# Patient Record
Sex: Female | Born: 1980 | Race: Black or African American | Hispanic: No | Marital: Married | Smoking: Current every day smoker
Health system: Southern US, Community
[De-identification: ages and names within clinical notes are randomized; demographics above are authoritative.]

## PROBLEM LIST (undated history)

## (undated) DIAGNOSIS — F419 Anxiety disorder, unspecified: Secondary | ICD-10-CM

## (undated) DIAGNOSIS — O139 Gestational [pregnancy-induced] hypertension without significant proteinuria, unspecified trimester: Secondary | ICD-10-CM

## (undated) DIAGNOSIS — G35 Multiple sclerosis: Secondary | ICD-10-CM

## (undated) DIAGNOSIS — H18609 Keratoconus, unspecified, unspecified eye: Secondary | ICD-10-CM

## (undated) DIAGNOSIS — F32A Depression, unspecified: Secondary | ICD-10-CM

## (undated) DIAGNOSIS — F329 Major depressive disorder, single episode, unspecified: Secondary | ICD-10-CM

## (undated) HISTORY — PX: NO PAST SURGERIES: SHX2092

## (undated) HISTORY — PX: WISDOM TOOTH EXTRACTION: SHX21

## (undated) HISTORY — DX: Multiple sclerosis: G35

## (undated) HISTORY — DX: Major depressive disorder, single episode, unspecified: F32.9

## (undated) HISTORY — DX: Depression, unspecified: F32.A

---
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
## 2007-01-31 ENCOUNTER — Ambulatory Visit (HOSPITAL_COMMUNITY): Admission: RE | Admit: 2007-01-31 | Discharge: 2007-01-31 | Payer: Self-pay | Admitting: Obstetrics & Gynecology

## 2007-02-02 ENCOUNTER — Emergency Department (HOSPITAL_COMMUNITY): Admission: EM | Admit: 2007-02-02 | Discharge: 2007-02-02 | Payer: Self-pay | Admitting: Emergency Medicine

## 2007-02-04 ENCOUNTER — Emergency Department (HOSPITAL_COMMUNITY): Admission: EM | Admit: 2007-02-04 | Discharge: 2007-02-04 | Payer: Self-pay | Admitting: Family Medicine

## 2007-02-05 ENCOUNTER — Encounter: Admission: RE | Admit: 2007-02-05 | Discharge: 2007-02-05 | Payer: Self-pay | Admitting: Family Medicine

## 2007-02-06 ENCOUNTER — Emergency Department (HOSPITAL_COMMUNITY): Admission: EM | Admit: 2007-02-06 | Discharge: 2007-02-06 | Payer: Self-pay | Admitting: Family Medicine

## 2007-02-14 ENCOUNTER — Ambulatory Visit (HOSPITAL_COMMUNITY): Admission: RE | Admit: 2007-02-14 | Discharge: 2007-02-14 | Payer: Self-pay | Admitting: Family Medicine

## 2007-06-30 ENCOUNTER — Inpatient Hospital Stay (HOSPITAL_COMMUNITY): Admission: AD | Admit: 2007-06-30 | Discharge: 2007-06-30 | Payer: Self-pay | Admitting: Obstetrics and Gynecology

## 2007-06-30 ENCOUNTER — Inpatient Hospital Stay (HOSPITAL_COMMUNITY): Admission: AD | Admit: 2007-06-30 | Discharge: 2007-07-02 | Payer: Self-pay | Admitting: Family Medicine

## 2007-06-30 ENCOUNTER — Ambulatory Visit: Payer: Self-pay | Admitting: Obstetrics and Gynecology

## 2007-12-14 ENCOUNTER — Emergency Department (HOSPITAL_COMMUNITY): Admission: EM | Admit: 2007-12-14 | Discharge: 2007-12-14 | Payer: Self-pay | Admitting: Family Medicine

## 2007-12-18 ENCOUNTER — Emergency Department (HOSPITAL_COMMUNITY): Admission: EM | Admit: 2007-12-18 | Discharge: 2007-12-18 | Payer: Self-pay | Admitting: Emergency Medicine

## 2007-12-20 ENCOUNTER — Emergency Department (HOSPITAL_COMMUNITY): Admission: EM | Admit: 2007-12-20 | Discharge: 2007-12-20 | Payer: Self-pay | Admitting: Emergency Medicine

## 2008-03-17 ENCOUNTER — Emergency Department (HOSPITAL_COMMUNITY): Admission: EM | Admit: 2008-03-17 | Discharge: 2008-03-17 | Payer: Self-pay | Admitting: Emergency Medicine

## 2008-12-04 ENCOUNTER — Emergency Department (HOSPITAL_COMMUNITY): Admission: EM | Admit: 2008-12-04 | Discharge: 2008-12-05 | Payer: Self-pay | Admitting: Emergency Medicine

## 2009-01-18 ENCOUNTER — Ambulatory Visit (HOSPITAL_COMMUNITY): Admission: RE | Admit: 2009-01-18 | Discharge: 2009-01-18 | Payer: Self-pay | Admitting: Family Medicine

## 2009-01-26 ENCOUNTER — Inpatient Hospital Stay (HOSPITAL_COMMUNITY): Admission: AD | Admit: 2009-01-26 | Discharge: 2009-01-26 | Payer: Self-pay | Admitting: Obstetrics and Gynecology

## 2009-02-08 ENCOUNTER — Ambulatory Visit (HOSPITAL_COMMUNITY): Admission: RE | Admit: 2009-02-08 | Discharge: 2009-02-08 | Payer: Self-pay | Admitting: Family Medicine

## 2009-02-25 ENCOUNTER — Ambulatory Visit (HOSPITAL_COMMUNITY): Admission: RE | Admit: 2009-02-25 | Discharge: 2009-02-25 | Payer: Self-pay | Admitting: Family Medicine

## 2009-07-07 ENCOUNTER — Inpatient Hospital Stay (HOSPITAL_COMMUNITY): Admission: AD | Admit: 2009-07-07 | Discharge: 2009-07-07 | Payer: Self-pay | Admitting: Obstetrics & Gynecology

## 2009-07-17 ENCOUNTER — Inpatient Hospital Stay (HOSPITAL_COMMUNITY): Admission: AD | Admit: 2009-07-17 | Discharge: 2009-07-17 | Payer: Self-pay | Admitting: Obstetrics & Gynecology

## 2009-07-22 ENCOUNTER — Inpatient Hospital Stay (HOSPITAL_COMMUNITY): Admission: AD | Admit: 2009-07-22 | Discharge: 2009-07-24 | Payer: Self-pay | Admitting: Obstetrics & Gynecology

## 2009-07-22 ENCOUNTER — Ambulatory Visit: Payer: Self-pay | Admitting: Advanced Practice Midwife

## 2009-12-20 ENCOUNTER — Emergency Department (HOSPITAL_COMMUNITY): Admission: EM | Admit: 2009-12-20 | Discharge: 2009-12-20 | Payer: Self-pay | Admitting: Family Medicine

## 2010-01-22 NOTE — L&D Delivery Note (Addendum)
Delivery Note At 4:42 AM a viable female was delivered via Vaginal, Spontaneous Delivery (Presentation: vtx;  Direct Occiput Posterior).  APGAR: 8/9; weight 7lbs 6.5Oz.   Placenta status: Intact, Spontaneous.  Cord: 3 vessels with the following complications: none.    Anesthesia: Epidural  Episiotomy: none Lacerations: minor abrasions Suture Repair: n/a Est. Blood Loss (mL): 200cc  Mom to postpartum.  Baby to nursery-stable.  Jasminemarie Sherrard Y 01/21/2011, 4:55 AM

## 2010-04-09 LAB — WET PREP, GENITAL
Clue Cells Wet Prep HPF POC: NONE SEEN
Yeast Wet Prep HPF POC: NONE SEEN

## 2010-04-09 LAB — CBC
Hemoglobin: 10 g/dL — ABNORMAL LOW (ref 12.0–15.0)
Hemoglobin: 11.1 g/dL — ABNORMAL LOW (ref 12.0–15.0)
MCHC: 33.8 g/dL (ref 30.0–36.0)
MCV: 96.7 fL (ref 78.0–100.0)
MCV: 97.2 fL (ref 78.0–100.0)
Platelets: 174 10*3/uL (ref 150–400)
Platelets: 189 10*3/uL (ref 150–400)
RBC: 3 MIL/uL — ABNORMAL LOW (ref 3.87–5.11)
WBC: 7.9 10*3/uL (ref 4.0–10.5)
WBC: 9.4 10*3/uL (ref 4.0–10.5)

## 2010-04-09 LAB — URINALYSIS, ROUTINE W REFLEX MICROSCOPIC
Nitrite: NEGATIVE
pH: 6 (ref 5.0–8.0)

## 2010-04-09 LAB — COMPREHENSIVE METABOLIC PANEL
AST: 29 U/L (ref 0–37)
CO2: 23 mEq/L (ref 19–32)
Calcium: 8.9 mg/dL (ref 8.4–10.5)
GFR calc Af Amer: >60 mL/min (ref >60)
Glucose, Bld: 86 mg/dL (ref 70–99)
Total Bilirubin: 0.3 mg/dL (ref 0.3–1.2)
Total Protein: 5.8 g/dL — ABNORMAL LOW (ref 6.0–8.3)

## 2010-04-09 LAB — URINALYSIS, DIPSTICK ONLY
Bilirubin Urine: NEGATIVE
Ketones, ur: NEGATIVE mg/dL
Urobilinogen, UA: 0.2 mg/dL (ref 0.0–1.0)

## 2010-04-09 LAB — URINE MICROSCOPIC-ADD ON

## 2010-04-09 LAB — RPR: RPR Ser Ql: NONREACTIVE

## 2010-04-09 LAB — URIC ACID: Uric Acid, Serum: 4.8 mg/dL (ref 2.4–7.0)

## 2010-04-09 LAB — GC/CHLAMYDIA PROBE AMP, GENITAL: Chlamydia, DNA Probe: NEGATIVE

## 2010-04-26 LAB — URINALYSIS, ROUTINE W REFLEX MICROSCOPIC
Bilirubin Urine: NEGATIVE
Hgb urine dipstick: NEGATIVE
Ketones, ur: 15 mg/dL — AB
Protein, ur: NEGATIVE mg/dL
Specific Gravity, Urine: 1.025 (ref 1.005–1.030)
pH: 6 (ref 5.0–8.0)

## 2010-04-26 LAB — HCG, QUANTITATIVE, PREGNANCY: hCG, Beta Chain, Quant, S: 11017 m[IU]/mL — ABNORMAL HIGH (ref <5)

## 2010-05-29 ENCOUNTER — Inpatient Hospital Stay (INDEPENDENT_AMBULATORY_CARE_PROVIDER_SITE_OTHER)
Admission: RE | Admit: 2010-05-29 | Discharge: 2010-05-29 | Disposition: A | Discharge status: Home / Self Care | Payer: Medicaid Other | Source: Ambulatory Visit | Attending: Family Medicine | Admitting: Family Medicine

## 2010-05-29 DIAGNOSIS — Z331 Pregnant state, incidental: Secondary | ICD-10-CM

## 2010-05-29 LAB — POCT PREGNANCY, URINE: Preg Test, Ur: POSITIVE

## 2010-07-25 ENCOUNTER — Ambulatory Visit (HOSPITAL_COMMUNITY): Payer: Medicaid Other

## 2010-08-02 ENCOUNTER — Encounter (HOSPITAL_COMMUNITY): Payer: Medicaid Other

## 2010-08-02 ENCOUNTER — Ambulatory Visit (HOSPITAL_COMMUNITY): Payer: Medicaid Other

## 2010-08-09 ENCOUNTER — Ambulatory Visit (HOSPITAL_COMMUNITY): Payer: Medicaid Other

## 2010-08-24 ENCOUNTER — Ambulatory Visit (HOSPITAL_COMMUNITY): Payer: Medicaid Other | Attending: Obstetrics and Gynecology

## 2010-10-12 LAB — CULTURE, ROUTINE-ABSCESS

## 2010-10-19 LAB — CBC
HCT: 36.8
Hemoglobin: 13.1
MCHC: 35.6
MCV: 94.7
RBC: 3.89
RDW: 12.5

## 2010-10-24 LAB — CULTURE, ROUTINE-ABSCESS

## 2010-12-22 ENCOUNTER — Encounter (HOSPITAL_COMMUNITY): Payer: Self-pay | Admitting: *Deleted

## 2010-12-22 ENCOUNTER — Inpatient Hospital Stay (HOSPITAL_COMMUNITY)
Admission: AD | Admit: 2010-12-22 | Discharge: 2010-12-22 | Disposition: A | Discharge status: Home / Self Care | Payer: Medicaid Other | Source: Ambulatory Visit | Attending: Obstetrics and Gynecology | Admitting: Obstetrics and Gynecology

## 2010-12-22 DIAGNOSIS — B3731 Acute candidiasis of vulva and vagina: Secondary | ICD-10-CM | POA: Insufficient documentation

## 2010-12-22 DIAGNOSIS — B373 Candidiasis of vulva and vagina: Secondary | ICD-10-CM

## 2010-12-22 DIAGNOSIS — O2390 Unspecified genitourinary tract infection in pregnancy, unspecified trimester: Secondary | ICD-10-CM | POA: Insufficient documentation

## 2010-12-22 HISTORY — DX: Gestational (pregnancy-induced) hypertension without significant proteinuria, unspecified trimester: O13.9

## 2010-12-22 LAB — URINALYSIS, ROUTINE W REFLEX MICROSCOPIC
Bilirubin Urine: NEGATIVE
Hgb urine dipstick: NEGATIVE
Nitrite: NEGATIVE
Protein, ur: NEGATIVE mg/dL
Urobilinogen, UA: 0.2 mg/dL (ref 0.0–1.0)

## 2010-12-22 LAB — WET PREP, GENITAL

## 2010-12-22 NOTE — ED Provider Notes (Signed)
History   30 yo G3P2002 at 27 5/7 weeks presented c/o vaginal wetness and some pelvic pressure.  Denies bleeding or contractions, reports +FM.  Diagnosed with yeast vaginitis over last 1-2 weeks, but did not use Terazol suppositories as prescribed.  Pregnancy remarkable for: Hx pre-eclampsia with last pregnancy Anxiety/depression--on Welbutrin Conceived on psychiatric meds (Klonipin and Celexa) Daughter with pigmentary mosaisicm MRSA in past Smoker GBS positive bacteruria Hx circumvallate placenta    Chief Complaint  Patient presents with  . Rupture of Membranes     OB History    Grav Para Term Preterm Abortions TAB SAB Ect Mult Living   3 2 2  0 0 0 0 0 0 2      Past Medical History  Diagnosis Date  . Pregnancy induced hypertension     Past Surgical History  Procedure Date  . No past surgeries     History reviewed. No pertinent family history.  History  Substance Use Topics  . Smoking status: Current Everyday Smoker -- 0.2 packs/day for 8 years    Types: Cigarettes  . Smokeless tobacco: Not on file  . Alcohol Use: No    Allergies: No Known Allergies  Prescriptions prior to admission  Medication Sig Dispense Refill  . busPIRone (BUSPAR) 10 MG tablet Take 10 mg by mouth at bedtime.        . diphenhydramine-acetaminophen (TYLENOL PM) 25-500 MG TABS Take 2 tablets by mouth at bedtime as needed. sleep       . prenatal vitamin w/FE, FA (PRENATAL 1 + 1) 27-1 MG TABS Take 1 tablet by mouth daily.           Physical Exam   Blood pressure 117/68, pulse 90, temperature 99.1 F (37.3 C), temperature source Oral, resp. rate 20, height 5' 5.25" (1.657 m), weight 113.002 kg (249 lb 2 oz).  Chest clear Heart RRR without murmur Abd gravid, NT Pelvic--copious curdy discharge in vault.  Pooling and fern negative Cervix closed, long, posterior.  Vtx -1 station. Ext WNL FHR reactive, no decels No UCs.  Results for orders placed during the hospital encounter of  12/22/10 (from the past 24 hour(s))  WET PREP, GENITAL     Status: Abnormal   Collection Time   12/22/10  7:24 PM      Component Value Range   Yeast, Wet Prep FEW (*) NONE SEEN    Trich, Wet Prep NONE SEEN  NONE SEEN    Clue Cells, Wet Prep FEW (*) NONE SEEN    WBC, Wet Prep HPF POC MODERATE (*) NONE SEEN   AMNISURE RUPTURE OF MEMBRANE (ROM)     Status: Normal   Collection Time   12/22/10  7:24 PM      Component Value Range   Amnisure ROM NEGATIVE    URINALYSIS, ROUTINE W REFLEX MICROSCOPIC     Status: Abnormal   Collection Time   12/22/10  7:25 PM      Component Value Range   Color, Urine YELLOW  YELLOW    APPearance CLEAR  CLEAR    Specific Gravity, Urine <1.005 (*) 1.005 - 1.030    pH 6.5  5.0 - 8.0    Glucose, UA NEGATIVE  NEGATIVE (mg/dL)   Hgb urine dipstick NEGATIVE  NEGATIVE    Bilirubin Urine NEGATIVE  NEGATIVE    Ketones, ur NEGATIVE  NEGATIVE (mg/dL)   Protein, ur NEGATIVE  NEGATIVE (mg/dL)   Urobilinogen, UA 0.2  0.0 - 1.0 (mg/dL)   Nitrite NEGATIVE  NEGATIVE    Leukocytes, UA MODERATE (*) NEGATIVE   URINE MICROSCOPIC-ADD ON     Status: Normal   Collection Time   12/22/10  7:25 PM      Component Value Range   Squamous Epithelial / LPF RARE  RARE    WBC, UA 0-2  <3 (WBC/hpf)   Bacteria, UA RARE  RARE    GC, Chlamydia, GBS pending.  ED Course  IUP at 34 5/7 weeks Yeast vaginitis Negative amnisure Reactive FHR  Plan: D/C home with instructions to utilize Terazol suppositories as prescribed. Follow-up at [facility] as indicated or as needed. S/S PTL reviewed.  Nigel Bridgeman, CNM, [place] 12/22/10 8:15pm

## 2010-12-22 NOTE — Progress Notes (Signed)
Pt states, " I' felt a lot of pelvic pressure like my period was coming on at 5:15 pm; I went to the bathroom and my panties were wet."

## 2010-12-23 LAB — GC/CHLAMYDIA PROBE AMP, GENITAL: Chlamydia, DNA Probe: NEGATIVE

## 2010-12-27 LAB — CULTURE, BETA STREP (GROUP B ONLY)

## 2011-01-20 ENCOUNTER — Encounter (HOSPITAL_COMMUNITY): Payer: Self-pay | Admitting: Anesthesiology

## 2011-01-20 ENCOUNTER — Inpatient Hospital Stay (HOSPITAL_COMMUNITY)
Admission: AD | Admit: 2011-01-20 | Discharge: 2011-01-23 | DRG: 775 | Disposition: A | Discharge status: Home / Self Care | Payer: Medicaid Other | Source: Ambulatory Visit | Attending: Obstetrics and Gynecology | Admitting: Obstetrics and Gynecology

## 2011-01-20 ENCOUNTER — Inpatient Hospital Stay (HOSPITAL_COMMUNITY): Payer: Medicaid Other | Admitting: Anesthesiology

## 2011-01-20 ENCOUNTER — Encounter (HOSPITAL_COMMUNITY): Payer: Self-pay | Admitting: *Deleted

## 2011-01-20 DIAGNOSIS — F32A Depression, unspecified: Secondary | ICD-10-CM

## 2011-01-20 DIAGNOSIS — F329 Major depressive disorder, single episode, unspecified: Secondary | ICD-10-CM

## 2011-01-20 DIAGNOSIS — O99344 Other mental disorders complicating childbirth: Secondary | ICD-10-CM | POA: Diagnosis present

## 2011-01-20 DIAGNOSIS — Z22330 Carrier of Group B streptococcus: Secondary | ICD-10-CM

## 2011-01-20 DIAGNOSIS — O99892 Other specified diseases and conditions complicating childbirth: Principal | ICD-10-CM | POA: Diagnosis present

## 2011-01-20 DIAGNOSIS — F411 Generalized anxiety disorder: Secondary | ICD-10-CM | POA: Diagnosis present

## 2011-01-20 LAB — CBC
HCT: 32.2 % — ABNORMAL LOW (ref 36.0–46.0)
Hemoglobin: 10.8 g/dL — ABNORMAL LOW (ref 12.0–15.0)
MCH: 30.9 pg (ref 26.0–34.0)
MCHC: 33.5 g/dL (ref 30.0–36.0)

## 2011-01-20 MED ORDER — OXYCODONE-ACETAMINOPHEN 5-325 MG PO TABS: 2.0000 | ORAL_TABLET | ORAL | Status: DC | PRN

## 2011-01-20 MED ORDER — OXYTOCIN 20 UNITS IN LACTATED RINGERS INFUSION - SIMPLE
125.0000 mL/h | Freq: Once | INTRAVENOUS | Status: AC
  Administered 2011-01-21: 125 mL/h via INTRAVENOUS

## 2011-01-20 MED ORDER — IBUPROFEN 600 MG PO TABS
600.0000 mg | ORAL_TABLET | Freq: Four times a day (QID) | ORAL | Status: DC | PRN
  Administered 2011-01-21: 600 mg via ORAL
  Filled 2011-01-20: qty 1

## 2011-01-20 MED ORDER — PENICILLIN G POTASSIUM 5000000 UNITS IJ SOLR
5.0000 10*6.[IU] | Freq: Once | INTRAVENOUS | Status: AC
  Administered 2011-01-20: 5 10*6.[IU] via INTRAVENOUS
  Filled 2011-01-20: qty 5

## 2011-01-20 MED ORDER — CITRIC ACID-SODIUM CITRATE 334-500 MG/5ML PO SOLN: 30.0000 mL | ORAL | Status: DC | PRN

## 2011-01-20 MED ORDER — FENTANYL 2.5 MCG/ML BUPIVACAINE 1/10 % EPIDURAL INFUSION (WH - ANES)
14.0000 mL/h | INTRAMUSCULAR | Status: DC
  Administered 2011-01-21: 14 mL/h via EPIDURAL
  Filled 2011-01-20 (×2): qty 60

## 2011-01-20 MED ORDER — PENICILLIN G POTASSIUM 5000000 UNITS IJ SOLR
2.5000 10*6.[IU] | INTRAVENOUS | Status: DC
  Administered 2011-01-21 (×2): 2.5 10*6.[IU] via INTRAVENOUS
  Filled 2011-01-20 (×5): qty 2.5

## 2011-01-20 MED ORDER — LACTATED RINGERS IV SOLN: 500.0000 mL | INTRAVENOUS | Status: DC | PRN

## 2011-01-20 MED ORDER — LIDOCAINE HCL (PF) 1 % IJ SOLN
30.0000 mL | INTRAMUSCULAR | Status: DC | PRN
  Filled 2011-01-20: qty 30

## 2011-01-20 MED ORDER — EPHEDRINE 5 MG/ML INJ
10.0000 mg | INTRAVENOUS | Status: DC | PRN
  Filled 2011-01-20: qty 4

## 2011-01-20 MED ORDER — LACTATED RINGERS IV SOLN
500.0000 mL | Freq: Once | INTRAVENOUS | Status: AC
  Administered 2011-01-20: 500 mL via INTRAVENOUS

## 2011-01-20 MED ORDER — OXYTOCIN 10 UNIT/ML IJ SOLN: 10.0000 [IU] | Freq: Once | INTRAMUSCULAR | Status: DC

## 2011-01-20 MED ORDER — LACTATED RINGERS IV SOLN
INTRAVENOUS | Status: DC
  Administered 2011-01-20 – 2011-01-21 (×2): via INTRAVENOUS

## 2011-01-20 MED ORDER — PHENYLEPHRINE 40 MCG/ML (10ML) SYRINGE FOR IV PUSH (FOR BLOOD PRESSURE SUPPORT)
80.0000 ug | PREFILLED_SYRINGE | INTRAVENOUS | Status: DC | PRN
  Filled 2011-01-20: qty 5

## 2011-01-20 MED ORDER — OXYTOCIN BOLUS FROM INFUSION
500.0000 mL | Freq: Once | INTRAVENOUS | Status: DC
  Filled 2011-01-20: qty 500

## 2011-01-20 MED ORDER — BUTORPHANOL TARTRATE 2 MG/ML IJ SOLN: 2.0000 mg | INTRAMUSCULAR | Status: DC | PRN

## 2011-01-20 MED ORDER — EPHEDRINE 5 MG/ML INJ: 10.0000 mg | INTRAVENOUS | Status: DC | PRN

## 2011-01-20 MED ORDER — ACETAMINOPHEN 325 MG PO TABS: 650.0000 mg | ORAL_TABLET | ORAL | Status: DC | PRN

## 2011-01-20 MED ORDER — OXYTOCIN 20 UNITS IN LACTATED RINGERS INFUSION - SIMPLE
1.0000 m[IU]/min | INTRAVENOUS | Status: DC
  Administered 2011-01-20: 2 m[IU]/min via INTRAVENOUS
  Filled 2011-01-20: qty 1000

## 2011-01-20 MED ORDER — PHENYLEPHRINE 40 MCG/ML (10ML) SYRINGE FOR IV PUSH (FOR BLOOD PRESSURE SUPPORT): 80.0000 ug | PREFILLED_SYRINGE | INTRAVENOUS | Status: DC | PRN

## 2011-01-20 MED ORDER — DIPHENHYDRAMINE HCL 50 MG/ML IJ SOLN: 12.5000 mg | INTRAMUSCULAR | Status: DC | PRN

## 2011-01-20 MED ORDER — ONDANSETRON HCL 4 MG/2ML IJ SOLN
4.0000 mg | Freq: Four times a day (QID) | INTRAMUSCULAR | Status: DC | PRN
  Administered 2011-01-21: 4 mg via INTRAVENOUS
  Filled 2011-01-20: qty 2

## 2011-01-20 MED ORDER — TERBUTALINE SULFATE 1 MG/ML IJ SOLN: 0.2500 mg | Freq: Once | INTRAMUSCULAR | Status: AC | PRN

## 2011-01-20 NOTE — ED Notes (Signed)
Fern slide positive 

## 2011-01-20 NOTE — Anesthesia Preprocedure Evaluation (Addendum)
Anesthesia Evaluation  Patient identified by MRN, date of birth, ID band Patient awake    Reviewed: Allergy & Precautions, H&P , Patient's Chart, lab work & pertinent test results  Airway Mallampati: II TM Distance: >3 FB Neck ROM: full    Dental  (+) Teeth Intact   Pulmonary  clear to auscultation        Cardiovascular regular Normal    Neuro/Psych    GI/Hepatic   Endo/Other  Morbid obesity  Renal/GU      Musculoskeletal   Abdominal   Peds  Hematology   Anesthesia Other Findings       Reproductive/Obstetrics (+) Pregnancy                           Anesthesia Physical Anesthesia Plan  ASA: III  Anesthesia Plan: Epidural   Post-op Pain Management:    Induction:   Airway Management Planned:   Additional Equipment:   Intra-op Plan:   Post-operative Plan:   Informed Consent: I have reviewed the patients History and Physical, chart, labs and discussed the procedure including the risks, benefits and alternatives for the proposed anesthesia with the patient or authorized representative who has indicated his/her understanding and acceptance.   Dental Advisory Given  Plan Discussed with:   Anesthesia Plan Comments: (Labs checked- platelets confirmed with RN in room. Fetal heart tracing, per RN, reported to be stable enough for sitting procedure. Discussed epidural, and patient consents to the procedure:  included risk of possible headache,backache, failed block, allergic reaction, and nerve injury. This patient was asked if she had any questions or concerns before the procedure started. )        Anesthesia Quick Evaluation  

## 2011-01-20 NOTE — Plan of Care (Signed)
Problem: Consults Goal: Birthing Suites Patient Information Press F2 to bring up selections list Outcome: Completed/Met Date Met:  01/20/11  Pt 37-[redacted] weeks EGA  Comments:  38.6wks

## 2011-01-20 NOTE — H&P (Signed)
Patricia Jensen is a 30 y.o. female presenting for SROM at about 1530, states she had a gush of fluid, reports rare ctx, not painful at this time. Denies VB, +FM. Fern slide was pos in MAU, VE per RN in MAU 4cm Pregnancy significant for:  1. Smoker hx 2. Conceived on psych meds - klonopin 3. Hx abnl pap - colpo 4. Hx pre-eclampsia 2011 - had Mag sulfate 5. Anxiety/depression 6. ? MRSA 7. +GBS 8. Hx circumvallate placenta  HPI: pt began prenatal care at [facility] at 8wks, GBS Pos in urine from NOB labs. circumvallate placenta was identified on Anat [place], f/u US were normal. A growth [place] was done at 33wks and was normal at 52%. Pt had recurrent hydronitiis lesions on inner thigh and was given keflex at 36wks. A growth [place] at 37wks showed EFW 8#4oz - 94% and AC at >98%, grade 2 placenta, AFI 50%  Maternal Medical History:  Reason for admission: Reason for admission: rupture of membranes.  Contractions: Onset was 3-5 hours ago.   Frequency: rare.   Duration is approximately 50 seconds.   Perceived severity is mild.    Fetal activity: Perceived fetal activity is normal.   Last perceived fetal movement was within the past hour.    Prenatal complications: no prenatal complications   OB History    Grav Para Term Preterm Abortions TAB SAB Ect Mult Living   3 2 2  0 0 0 0 0 0 2     Past Medical History  Diagnosis Date  . Pregnancy induced hypertension    Past Surgical History  Procedure Date  . No past surgeries    Family History: family history is negative for Anesthesia problems, and Hypotension, and Malignant hyperthermia, . Social History:  reports that she has been smoking Cigarettes.  She has a 2 pack-year smoking history. She has never used smokeless tobacco. She reports that she does not drink alcohol or use illicit drugs.  Pt is married AA 30yo, 20yrs education, is a Futures trader.   Review of Systems  All other systems reviewed and are negative.    Dilation: 4 Effacement (%):  70 Station: -2 Blood pressure 121/77, pulse 98, temperature 99 F (37.2 C), temperature source Oral, resp. rate 18, height 5\' 5"  (1.651 m), weight 114.125 kg (251 lb 9.6 oz). Maternal Exam:  Uterine Assessment: Contraction strength is mild.  Contraction duration is 50 seconds. Contraction frequency is rare.   Abdomen: Patient reports no abdominal tenderness. Fundal height is aga.   Estimated fetal weight is 7-11.   Fetal presentation: vertex  Introitus: Normal vulva. Normal vagina.  Ferning test: positive.  Amniotic fluid character: clear. Fern pos per RN  Pelvis: adequate for delivery.   Cervix: Cervix evaluated by digital exam.     Fetal Exam Fetal Monitor Review: Mode: ultrasound.   Baseline rate: 130.  Variability: moderate (6-25 bpm).   Pattern: accelerations present and no decelerations.    Fetal State Assessment: Category I - tracings are normal.     Physical Exam  Nursing note and vitals reviewed. Constitutional: She is oriented to person, place, and time. She appears well-developed and well-nourished.  Cardiovascular: Normal rate and normal heart sounds.   Respiratory: Effort normal.  GI: Soft.  Genitourinary: Vagina normal.  Musculoskeletal: Normal range of motion. She exhibits no edema.  Neurological: She is alert and oriented to person, place, and time. She has normal reflexes.  Skin: Skin is warm and dry.  Psychiatric: She has a normal mood  and affect. Her behavior is normal. Judgment and thought content normal.    Prenatal labs: ABO, Rh:  O pos Antibody:  neg Rubella:  IMM RPR:   NR HBsAg:   neg HIV:  neg  GBS:   POS in urine HgbA1c - 5.7 1hr gtt WNL   Assessment/Plan: IUP at [redacted]w[redacted]d SROM - no active labor GBS pos FHR reassuring Anxiety - stable   Admit to birthing suites - Dr Su Hilt attending Routine MD orders Pitocin augmentation PCN per protocol Analgesia/anasthesia PRN Consulted w DR Evlyn Kanner M 01/20/2011, 8:02  PM

## 2011-01-20 NOTE — Progress Notes (Signed)
Reports having a moderate amount of clear fluid leak out about 1 hr ago. Reports mild contractions and good fetal movement.

## 2011-01-20 NOTE — Progress Notes (Signed)
ZALEA PETE is a 31 y.o. G3P2002 at [redacted]w[redacted]d admitted for rupture of membranes  Subjective: No complaints  Objective: BP 121/79  Pulse 91  Temp(Src) 98.3 F (36.8 C) (Oral)  Resp 19  Ht 5\' 5"  (1.651 m)  Wt 114.125 kg (251 lb 9.6 oz)  BMI 41.87 kg/m2      FHT:  FHR: 140s-150 bpm, variability: moderate,  accelerations:  Present,  decelerations:  Absent UC:   irregular, every 2-5 minutes SVE:   Dilation: 5 Effacement (%): 80 Station: -1 Exam by:: Lance Morin, MD  Labs: Lab Results  Component Value Date   WBC 11.5* 01/20/2011   HGB 10.8* 01/20/2011   HCT 32.2* 01/20/2011   MCV 92.3 01/20/2011   PLT 210 01/20/2011    Assessment / Plan: Augmentation of labor, progressing well  Labor: Progressing on pitocin Fetal Wellbeing:  Category I Pain Control:  pt requesting epidural now Anticipated MOD:  NSVD  Oluwatimileyin Vivier Y 01/20/2011, 10:56 PM

## 2011-01-21 ENCOUNTER — Encounter (HOSPITAL_COMMUNITY): Payer: Self-pay | Admitting: *Deleted

## 2011-01-21 MED ORDER — PRENATAL MULTIVITAMIN CH
1.0000 | ORAL_TABLET | Freq: Every day | ORAL | Status: DC
  Administered 2011-01-22 – 2011-01-23 (×2): 1 via ORAL
  Filled 2011-01-21: qty 1

## 2011-01-21 MED ORDER — LANOLIN HYDROUS EX OINT: TOPICAL_OINTMENT | CUTANEOUS | Status: DC | PRN

## 2011-01-21 MED ORDER — ONDANSETRON HCL 4 MG PO TABS: 4.0000 mg | ORAL_TABLET | ORAL | Status: DC | PRN

## 2011-01-21 MED ORDER — SENNOSIDES-DOCUSATE SODIUM 8.6-50 MG PO TABS
2.0000 | ORAL_TABLET | Freq: Every day | ORAL | Status: DC
  Administered 2011-01-21 – 2011-01-22 (×2): 2 via ORAL

## 2011-01-21 MED ORDER — DIBUCAINE 1 % RE OINT: 1.0000 "application " | TOPICAL_OINTMENT | RECTAL | Status: DC | PRN

## 2011-01-21 MED ORDER — BENZOCAINE-MENTHOL 20-0.5 % EX AERO
INHALATION_SPRAY | CUTANEOUS | Status: AC
  Filled 2011-01-21: qty 56

## 2011-01-21 MED ORDER — IBUPROFEN 600 MG PO TABS
600.0000 mg | ORAL_TABLET | Freq: Four times a day (QID) | ORAL | Status: DC
  Administered 2011-01-21 – 2011-01-23 (×9): 600 mg via ORAL
  Filled 2011-01-21 (×9): qty 1

## 2011-01-21 MED ORDER — TETANUS-DIPHTH-ACELL PERTUSSIS 5-2.5-18.5 LF-MCG/0.5 IM SUSP
0.5000 mL | Freq: Once | INTRAMUSCULAR | Status: AC
  Administered 2011-01-22: 0.5 mL via INTRAMUSCULAR
  Filled 2011-01-21: qty 0.5

## 2011-01-21 MED ORDER — ONDANSETRON HCL 4 MG/2ML IJ SOLN: 4.0000 mg | INTRAMUSCULAR | Status: DC | PRN

## 2011-01-21 MED ORDER — WITCH HAZEL-GLYCERIN EX PADS: 1.0000 "application " | MEDICATED_PAD | CUTANEOUS | Status: DC | PRN

## 2011-01-21 MED ORDER — BENZOCAINE-MENTHOL 20-0.5 % EX AERO: 1.0000 "application " | INHALATION_SPRAY | CUTANEOUS | Status: DC | PRN

## 2011-01-21 MED ORDER — BUSPIRONE HCL 10 MG PO TABS
10.0000 mg | ORAL_TABLET | Freq: Every day | ORAL | Status: DC
  Administered 2011-01-21 – 2011-01-22 (×2): 10 mg via ORAL
  Filled 2011-01-21 (×3): qty 1

## 2011-01-21 MED ORDER — DIPHENHYDRAMINE HCL 25 MG PO CAPS: 25.0000 mg | ORAL_CAPSULE | Freq: Four times a day (QID) | ORAL | Status: DC | PRN

## 2011-01-21 MED ORDER — SIMETHICONE 80 MG PO CHEW: 80.0000 mg | CHEWABLE_TABLET | ORAL | Status: DC | PRN

## 2011-01-21 MED ORDER — OXYCODONE-ACETAMINOPHEN 5-325 MG PO TABS
1.0000 | ORAL_TABLET | ORAL | Status: DC | PRN
Start: 2011-01-21 — End: 2011-01-23
  Administered 2011-01-21 – 2011-01-22 (×6): 1 via ORAL
  Filled 2011-01-21 (×6): qty 1

## 2011-01-21 MED ORDER — FENTANYL 2.5 MCG/ML BUPIVACAINE 1/10 % EPIDURAL INFUSION (WH - ANES)
INTRAMUSCULAR | Status: DC | PRN
  Administered 2011-01-21: 14 mL/h via EPIDURAL

## 2011-01-21 MED ORDER — SODIUM BICARBONATE 8.4 % IV SOLN
INTRAVENOUS | Status: DC | PRN
  Administered 2011-01-21: 4 mL via EPIDURAL

## 2011-01-21 MED ORDER — ZOLPIDEM TARTRATE 5 MG PO TABS: 5.0000 mg | ORAL_TABLET | Freq: Every evening | ORAL | Status: DC | PRN

## 2011-01-21 NOTE — Anesthesia Postprocedure Evaluation (Signed)
  Anesthesia Post-op Note  Patient: Patricia Jensen  Procedure(s) Performed: * No procedures listed *  Patient Location: Mother/Baby  Anesthesia Type: Epidural  Level of Consciousness: awake, alert  and oriented  Airway and Oxygen Therapy: Patient Spontanous Breathing  Post-op Pain: mild  Post-op Assessment: Patient's Cardiovascular Status Stable, Respiratory Function Stable, Patent Airway, No signs of Nausea or vomiting and Pain level controlled  Post-op Vital Signs: stable  Complications: No apparent anesthesia complications

## 2011-01-21 NOTE — Anesthesia Procedure Notes (Signed)

## 2011-01-21 NOTE — Progress Notes (Signed)

## 2011-01-22 LAB — CBC
MCH: 30.7 pg (ref 26.0–34.0)
MCV: 94.1 fL (ref 78.0–100.0)
Platelets: 205 10*3/uL (ref 150–400)
RBC: 3.23 MIL/uL — ABNORMAL LOW (ref 3.87–5.11)

## 2011-01-22 MED ORDER — OXYCODONE-ACETAMINOPHEN 5-325 MG PO TABS: 1.0000 | ORAL_TABLET | ORAL | Status: AC | PRN

## 2011-01-22 MED ORDER — IBUPROFEN 600 MG PO TABS: 600.0000 mg | ORAL_TABLET | Freq: Four times a day (QID) | ORAL | Status: AC

## 2011-01-22 NOTE — Progress Notes (Signed)
UR chart review completed.  

## 2011-01-22 NOTE — Progress Notes (Signed)
Patient ID: Patricia Jensen, female   DOB: 1980-01-31, 30 y.o.   MRN: [id number] Post Partum Day 1 Subjective: no complaints, up ad lib without syncope, voiding, tolerating PO, + flatus  Pain well controlled with po meds BF and bottle feeding Mood stable, bonding well Desires early D/C Will f/u with therapist for after care therapy and medication management for anxiety   Objective: Blood pressure 131/84, pulse 79, temperature 97.8 F (36.6 C), temperature source Oral, resp. rate 18, height 5\' 5"  (1.651 m), weight 114.125 kg (251 lb 9.6 oz), SpO2 97.00%, unknown if currently breastfeeding.  Physical Exam:  General: alert and no distress Lungs: CTAB Heart: RRR Breasts: WNL Lochia: appropriate Uterine Fundus: firm Perineum: WNL DVT Evaluation: No evidence of DVT seen on physical exam. Negative Homan's sign. No significant calf/ankle edema.   Basename 01/22/11 0525 01/20/11 2000  HGB 9.9* 10.8*  HCT 30.4* 32.2*    Assessment/Plan: Discharge home, Breastfeeding and Contraception considering mirena      LOS: 2 days   Bennetta Rudden M 01/22/2011, 6:29 PM

## 2011-01-23 DIAGNOSIS — F329 Major depressive disorder, single episode, unspecified: Secondary | ICD-10-CM

## 2011-01-23 DIAGNOSIS — F32A Depression, unspecified: Secondary | ICD-10-CM

## 2011-01-23 NOTE — Discharge Summary (Signed)
Physician Discharge Summary  Patient ID: Patricia Jensen MRN: [id number] DOB/AGE: 29/17/1982 28 y.o.  Admit date: 01/20/2011 Discharge date: 01/23/2011  Admission Diagnoses: term pg, labor  Discharge Diagnoses:  Active Problems:  SVD (spontaneous vaginal delivery)  Depression PP day 2  Normal involution Lactating  Discharged Condition: stable  Hospital Course: admitted for labor, SVD, normal involution  Consults: none Significant Diagnostic Studies: labs: hgb 10.8 Discharge Exam: Blood pressure 145/89, pulse 80, temperature 98.2 F (36.8 C), temperature source Oral, resp. rate 20, height 5\' 5"  (1.651 m), weight 251 lb 9.6 oz (114.125 kg), SpO2 97.00%, unknown if currently breastfeeding. Denies pain motrin working for her, mood fine, breastfeeding well, plans mirena IUD General appearance: alert, cooperative and no distress  FF sm serosa flow, -Homan's sign bilaterally, trace edema lower legs  Disposition: Home or Self Care   Medication List  As of 01/23/2011  9:29 AM   START taking these medications         ibuprofen 600 MG tablet   Commonly known as: ADVIL,MOTRIN   Take 1 tablet (600 mg total) by mouth every 6 (six) hours.      oxyCODONE-acetaminophen 5-325 MG per tablet   Commonly known as: PERCOCET   Take 1 tablet by mouth every 4 (four) hours as needed (moderate - severe pain).         CONTINUE taking these medications         busPIRone 10 MG tablet   Commonly known as: BUSPAR      prenatal vitamin w/FE, FA 27-1 MG Tabs          Where to get your medications    These are the prescriptions that you need to pick up.   You may get these medications from any pharmacy.         ibuprofen 600 MG tablet   oxyCODONE-acetaminophen 5-325 MG per tablet           Follow-up Information    Follow up with Purcell Nails, MD in 5 weeks. (If symptoms worsen)    Contact information:   [address] [place] [address] [phone]          Signed: Lavera Guise 01/23/2011, 9:29 AM

## 2011-05-31 ENCOUNTER — Other Ambulatory Visit: Payer: Self-pay | Admitting: Obstetrics and Gynecology

## 2011-05-31 NOTE — Telephone Encounter (Signed)
Triage received 

## 2011-05-31 NOTE — Telephone Encounter (Signed)
Rx for Plan B pill called in to [facility] on [address] per pt request. Pt was notified and voiced understanding.

## 2011-09-14 ENCOUNTER — Telehealth: Payer: Self-pay

## 2011-09-14 NOTE — Telephone Encounter (Signed)
Spoke to pt who states that she had un-protected intercourse 48 hours ago. She never started on the Necon back in January when it was prescribed, because she was wary of side effects. Plan B # 2 pills 1 po q 12 hr 0 RF. called to [facility] [address] [facility] states she only had the one pill regimen. This is fine. They were asked to just let the pt know that that was the only way they had it available. They said they would tell pt. Melody Comas A

## 2011-09-19 ENCOUNTER — Encounter (HOSPITAL_COMMUNITY): Payer: Self-pay | Admitting: Emergency Medicine

## 2011-09-19 ENCOUNTER — Emergency Department (HOSPITAL_COMMUNITY)
Admission: EM | Admit: 2011-09-19 | Discharge: 2011-09-19 | Disposition: A | Discharge status: Home / Self Care | Payer: Medicaid Other | Attending: Emergency Medicine | Admitting: Emergency Medicine

## 2011-09-19 DIAGNOSIS — N898 Other specified noninflammatory disorders of vagina: Secondary | ICD-10-CM | POA: Insufficient documentation

## 2011-09-19 DIAGNOSIS — R109 Unspecified abdominal pain: Secondary | ICD-10-CM | POA: Insufficient documentation

## 2011-09-19 HISTORY — DX: Anxiety disorder, unspecified: F41.9

## 2011-09-19 LAB — BASIC METABOLIC PANEL
Calcium: 9.7 mg/dL (ref 8.4–10.5)
Creatinine, Ser: 0.87 mg/dL (ref 0.50–1.10)
GFR calc Af Amer: >90 mL/min (ref >90)
GFR calc non Af Amer: 90 mL/min — ABNORMAL LOW (ref >90)

## 2011-09-19 LAB — CBC WITH DIFFERENTIAL/PLATELET
Basophils Absolute: 0 10*3/uL (ref 0.0–0.1)
Basophils Relative: 0 % (ref 0–1)
Eosinophils Relative: 6 % — ABNORMAL HIGH (ref 0–5)
HCT: 37.9 % (ref 36.0–46.0)
MCHC: 33.8 g/dL (ref 30.0–36.0)
MCV: 93.8 fL (ref 78.0–100.0)
Monocytes Absolute: 0.5 10*3/uL (ref 0.1–1.0)
Neutro Abs: 3.8 10*3/uL (ref 1.7–7.7)
RDW: 15.8 % — ABNORMAL HIGH (ref 11.5–15.5)

## 2011-09-19 LAB — POCT PREGNANCY, URINE: Preg Test, Ur: NEGATIVE

## 2011-09-19 NOTE — ED Notes (Signed)
Pt reports vaginal bleed onset yesterday with clots. Today pt reports pain on entire right side and abdominal cramping.

## 2012-02-02 ENCOUNTER — Emergency Department (HOSPITAL_COMMUNITY): Payer: Medicaid Other

## 2012-02-02 ENCOUNTER — Emergency Department (HOSPITAL_COMMUNITY)
Admission: EM | Admit: 2012-02-02 | Discharge: 2012-02-02 | Disposition: A | Discharge status: Home / Self Care | Payer: Medicaid Other | Attending: Emergency Medicine | Admitting: Emergency Medicine

## 2012-02-02 ENCOUNTER — Emergency Department (HOSPITAL_COMMUNITY)
Admission: EM | Admit: 2012-02-02 | Discharge: 2012-02-02 | Disposition: A | Discharge status: Nursing Home (Includes ICF) | Payer: Medicaid Other | Source: Home / Self Care | Attending: Emergency Medicine | Admitting: Emergency Medicine

## 2012-02-02 ENCOUNTER — Encounter (HOSPITAL_COMMUNITY): Payer: Self-pay | Admitting: Nurse Practitioner

## 2012-02-02 ENCOUNTER — Encounter (HOSPITAL_COMMUNITY): Payer: Self-pay | Admitting: Emergency Medicine

## 2012-02-02 DIAGNOSIS — Z3202 Encounter for pregnancy test, result negative: Secondary | ICD-10-CM | POA: Insufficient documentation

## 2012-02-02 DIAGNOSIS — M5418 Radiculopathy, sacral and sacrococcygeal region: Secondary | ICD-10-CM

## 2012-02-02 DIAGNOSIS — IMO0002 Reserved for concepts with insufficient information to code with codable children: Secondary | ICD-10-CM

## 2012-02-02 DIAGNOSIS — Z79899 Other long term (current) drug therapy: Secondary | ICD-10-CM | POA: Insufficient documentation

## 2012-02-02 DIAGNOSIS — F17200 Nicotine dependence, unspecified, uncomplicated: Secondary | ICD-10-CM | POA: Insufficient documentation

## 2012-02-02 DIAGNOSIS — M48061 Spinal stenosis, lumbar region without neurogenic claudication: Secondary | ICD-10-CM | POA: Insufficient documentation

## 2012-02-02 DIAGNOSIS — R5383 Other fatigue: Secondary | ICD-10-CM | POA: Insufficient documentation

## 2012-02-02 DIAGNOSIS — R5381 Other malaise: Secondary | ICD-10-CM | POA: Insufficient documentation

## 2012-02-02 DIAGNOSIS — M4800 Spinal stenosis, site unspecified: Secondary | ICD-10-CM

## 2012-02-02 DIAGNOSIS — R202 Paresthesia of skin: Secondary | ICD-10-CM

## 2012-02-02 LAB — CBC WITH DIFFERENTIAL/PLATELET
Basophils Absolute: 0 10*3/uL (ref 0.0–0.1)
Eosinophils Absolute: 0.1 10*3/uL (ref 0.0–0.7)
Lymphocytes Relative: 43 % (ref 12–46)
Lymphs Abs: 2.9 10*3/uL (ref 0.7–4.0)
MCH: 31.5 pg (ref 26.0–34.0)
Neutrophils Relative %: 48 % (ref 43–77)
Platelets: 259 10*3/uL (ref 150–400)
RBC: 3.94 MIL/uL (ref 3.87–5.11)
RDW: 13.1 % (ref 11.5–15.5)
WBC: 6.7 10*3/uL (ref 4.0–10.5)

## 2012-02-02 LAB — BASIC METABOLIC PANEL
Calcium: 9.4 mg/dL (ref 8.4–10.5)
GFR calc non Af Amer: 88 mL/min — ABNORMAL LOW (ref >90)
Sodium: 135 mEq/L (ref 135–145)

## 2012-02-02 LAB — POCT URINALYSIS DIP (DEVICE)
Leukocytes, UA: NEGATIVE
Protein, ur: NEGATIVE mg/dL
Urobilinogen, UA: 0.2 mg/dL (ref 0.0–1.0)

## 2012-02-02 MED ORDER — SODIUM CHLORIDE 0.9 % IV BOLUS (SEPSIS)
1000.0000 mL | Freq: Once | INTRAVENOUS | Status: AC
  Administered 2012-02-02: 1000 mL via INTRAVENOUS

## 2012-02-02 MED ORDER — PREDNISONE 10 MG PO TABS: 60.0000 mg | ORAL_TABLET | Freq: Every day | ORAL | Status: AC

## 2012-02-02 MED ORDER — PREDNISONE 20 MG PO TABS
60.0000 mg | ORAL_TABLET | Freq: Once | ORAL | Status: DC
  Filled 2012-02-02: qty 3

## 2012-02-02 MED ORDER — IBUPROFEN 800 MG PO TABS
800.0000 mg | ORAL_TABLET | Freq: Once | ORAL | Status: DC
  Filled 2012-02-02: qty 1

## 2012-02-02 MED ORDER — IBUPROFEN 800 MG PO TABS: 800.0000 mg | ORAL_TABLET | Freq: Three times a day (TID) | ORAL | Status: AC

## 2012-02-02 NOTE — ED Notes (Signed)
Pt slept on floor with children 3 nights ago, woke up with severe lower back pain. Then began to have numbness to feet that is moving upwards to legs and now to rectum and vagina. Pt is A&Ox4, ambulatory.

## 2012-02-02 NOTE — ED Notes (Signed)
Patient transported to MRI 

## 2012-02-02 NOTE — ED Provider Notes (Signed)
History     CSN: [id number]  Arrival date & time 02/02/12  1457   First MD Initiated Contact with Patient 02/02/12 1556      Chief Complaint  Patient presents with  . Numbness    (Consider location/radiation/quality/duration/timing/severity/associated sxs/prior treatment) HPI The patient presents with concerns of ongoing bilateral distal lower extremity numbness, as well as changes in sensation in her perineum.  She states that these symptoms all began approximately 3 days ago, with a possible precipitant of sleeping in an awkward position, and an awkward location. Since onset she has had persistent dysesthesia, and the perineum, with no sensation of when she is urinating, defecating.  Trace urinary incontinence.  She also complains of distal lower extremity numbness, new gait uncertainty.  She denies falls, fevers, chills.  She states there is also pain in her mid lumbar region.  The pain is sore, nonradiating. No relief with anything, no clear exacerbating factors.  Past Medical History  Diagnosis Date  . Pregnancy induced hypertension   . Anxiety     Past Surgical History  Procedure Date  . No past surgeries     Family History  Problem Relation Age of Onset  . Anesthesia problems Neg Hx   . Hypotension Neg Hx   . Malignant hyperthermia Neg Hx     History  Substance Use Topics  . Smoking status: Current Every Day Smoker -- 0.2 packs/day for 8 years    Types: Cigarettes  . Smokeless tobacco: Never Used  . Alcohol Use: No    OB History    Grav Para Term Preterm Abortions TAB SAB Ect Mult Living   3 3 3  0 0 0 0 0 0 3      Review of Systems  Constitutional:       Per HPI, otherwise negative  HENT:       Per HPI, otherwise negative  Eyes: Negative.   Respiratory:       Per HPI, otherwise negative  Cardiovascular:       Per HPI, otherwise negative  Gastrointestinal: Negative for vomiting.  Genitourinary: Negative.   Musculoskeletal:       Per HPI,  otherwise negative  Skin: Negative.   Neurological: Positive for weakness. Negative for syncope.    Allergies  Review of patient's allergies indicates no known allergies.  Home Medications   Current Outpatient Rx  Name  Route  Sig  Dispense  Refill  . CITALOPRAM HYDROBROMIDE 40 MG PO TABS   Oral   Take 40 mg by mouth at bedtime.          Marland Kitchen LORAZEPAM 2 MG PO TABS   Oral   Take 2 mg by mouth 2 (two) times daily as needed. For anxiety           BP 128/85  Pulse 64  Temp 98.2 F (36.8 C) (Oral)  Resp 20  SpO2 100%  LMP 01/26/2012  Physical Exam  Constitutional: She is oriented to person, place, and time. She appears well-developed and well-nourished. No distress.  HENT:  Head: Normocephalic and atraumatic.  Eyes: Conjunctivae normal and EOM are normal. Pupils are equal, round, and reactive to light.  Cardiovascular: Normal rate and regular rhythm.   Pulmonary/Chest: Effort normal. No stridor. No respiratory distress.  Abdominal: Soft. She exhibits no distension.  Musculoskeletal:       Arms:      The patient can flex and extend both hips, knees, ankles.  She has symmetric bilateral patellar reflexes, diminished  bilateral Achilles reflexes, diminished Babinski. No superficial lesions, no asymmetry, no gross deformities  Neurological: She is alert and oriented to person, place, and time. No cranial nerve deficit or sensory deficit. Coordination normal.       Patient does not sense rectal exam, but has appropriate rectal tone  Skin: She is not diaphoretic.    ED Course  Procedures (including critical care time)  Labs Reviewed  BASIC METABOLIC PANEL - Abnormal; Notable for the following:    GFR calc non Af Amer 88 (*)     All other components within normal limits  CBC WITH DIFFERENTIAL   Mr Lumbar Spine Wo Contrast  02/02/2012  *RADIOLOGY REPORT*  Clinical Data: 32 year old female with loss of sensation from waist down.  Perineum numbness.  Acute onset with no  known injury.  MRI LUMBAR SPINE WITHOUT CONTRAST  Technique:  Multiplanar and multiecho pulse sequences of the lumbar spine were obtained without intravenous contrast.  Comparison: None.  Findings: Lumbar segmentation appears to be normal.  Vertebral height and alignment within normal limits.  Degenerative endplate changes in the lower lumbar spine, but No marrow edema or evidence of acute osseous abnormality.   Visualized lower thoracic spinal cord is normal with conus medularis at L1-L2.  Cauda equina nerve roots appear within normal limits.  Visualized paraspinal soft tissues are within normal limits. Negative visualized abdominal viscera except for intrinsic T1 hyperintensity in the gallbladder.  Query milk of calcium bile or sludge.  T10-T11:  Grossly negative.  T11-T12: Negative.  T12-L1:  Negative  L1-L2:  Negative  L2-L3:  Negative.  L3-L4:  Negative.  L4-L5:  Disc desiccation.  Circumferential disc bulge with superimposed small to moderate right paracentral and slightly caudal disc protrusion with annular tear.  Effacement the lateral recesses, more so the right.  Overall mild spinal stenosis.  No foraminal stenosis.  Trace fluid in both facet joints.  L5-S1:  Disc desiccation.  Small central disc protrusion with annular tear.  This is in proximity to the S1 nerve roots but there is no nerve root displacement.  No spinal stenosis.  No foraminal involvement.  IMPRESSION: 1.  L4-L5 and L5-S1 disc degeneration.  Mild spinal and right lateral recess stenosis at the former. 2.  Otherwise negative lumbar spine. 3.  Evidence of complex fluid in the gallbladder, perhaps milk of calcium bile or sludge.   Original Report Authenticated By: Erskine Speed, M.D.      No diagnosis found.  7:39 PM Patient made aware of all responses.  She remained hemodynamically stable.  She'll be discharged to follow up with a neurosurgeon as needed.  She will also follow up with her primary care physician for additional  consideration of adjuvant therapy, including physical therapy, analgesics.  MDM  This young female presents with several days of dysesthesia, numbness in her lower extremities and perineum.  On exam she also is mild tenderness to palpation about the lumbar spine, and everywhere she had an epidural injection in the distant past.  Given the patient's new symptoms, the absence of rectal sensation on exam, though she has preserved rectum, there is some suspicion of epidural hematoma.  The patient's labs were reassuring.  MRI demonstrates spinal stenosis, desiccation of the discs, paravertebral extension of the disc.  Given the presence of her symptoms for several days, the absence of distress, preserved function in spite of loss of sensation, there suspicion for acute inflammatory process accompanying the changes on an MRI.  The patient was started  on anti-inflammatories, and we discussed the need for prompt neurosurgery and primary care followup.  She was discharged in stable condition.        Gerhard Munch, MD 02/02/12 [id number]

## 2012-02-02 NOTE — ED Notes (Signed)
Pt returned from MRI °

## 2012-02-02 NOTE — ED Provider Notes (Addendum)
History     CSN: [id number]  Arrival date & time 02/02/12  1259   First MD Initiated Contact with Patient 02/02/12 1324      Chief Complaint  Patient presents with  . Numbness    bilateral numbness in feet. vaginal/anal area. lower back pain.     (Consider location/radiation/quality/duration/timing/severity/associated sxs/prior treatment) HPI Comments: Patient presents urgent care, describing that for about 3 days she's been feeling numbness that extends all way from her perineal region, including anal urogenital regions a sensation of " constant numbness and tingling that extends all way down to her lower extremities bilaterally: ( Patient points to both inner aspect of upper thighs and anterior REGION)  " I can't feel when I urinate overnight have a bowel movement" " I have to look, to ascertain myself that I'm urinating". I think I lost control of my urine at one point " a little bit" About 3 days ago I slept on a hard surface and had some low back pain. Have not had any recent falls or injuries, patient denies any systemic symptoms such as unintentional weight loss, fevers, arthralgias, myalgias or changes in appetite. Patient denies a history of lower back problems or previous surgeries. This symptoms are not relieved or change in intensity or character with any positional changes.  Denies any lower extremity weakness or difficulty walking, or foot drop.  Patient is a 32 y.o. female presenting with neurologic complaint. The history is provided by the patient.  Neurologic Problem Primary symptoms do not include headaches, syncope, focal weakness, fever, nausea or vomiting. The symptoms began 3 to 5 days ago. The symptoms are unchanged. The neurological symptoms are multifocal.  Additional symptoms include lower back pain. Additional symptoms do not include neck stiffness, weakness or leg pain. Workup history does not include MRI or CT scan.    Past Medical History  Diagnosis Date  .  Pregnancy induced hypertension   . Anxiety     Past Surgical History  Procedure Date  . No past surgeries     Family History  Problem Relation Age of Onset  . Anesthesia problems Neg Hx   . Hypotension Neg Hx   . Malignant hyperthermia Neg Hx     History  Substance Use Topics  . Smoking status: Current Every Day Smoker -- 0.2 packs/day for 8 years    Types: Cigarettes  . Smokeless tobacco: Never Used  . Alcohol Use: No    OB History    Grav Para Term Preterm Abortions TAB SAB Ect Mult Living   3 3 3  0 0 0 0 0 0 3      Review of Systems  Constitutional: Positive for activity change. Negative for fever, chills, diaphoresis, fatigue and unexpected weight change.  HENT: Negative for neck stiffness.   Cardiovascular: Negative for syncope.  Gastrointestinal: Negative for nausea and vomiting.  Genitourinary: Negative for dysuria, flank pain and enuresis.  Musculoskeletal: Positive for back pain. Negative for joint swelling and gait problem.  Skin: Negative for rash and wound.  Neurological: Positive for numbness. Negative for focal weakness, weakness and headaches.    Allergies  Review of patient's allergies indicates no known allergies.  Home Medications   Current Outpatient Rx  Name  Route  Sig  Dispense  Refill  . BUSPIRONE HCL 10 MG PO TABS   Oral   Take 10 mg by mouth at bedtime.           Marland Kitchen CITALOPRAM HYDROBROMIDE 40 MG PO TABS  Oral   Take 40 mg by mouth daily.         Marland Kitchen LORAZEPAM 2 MG PO TABS   Oral   Take 2 mg by mouth every 6 (six) hours as needed.           BP 141/104  Pulse 71  Temp 98.4 F (36.9 C) (Oral)  Resp 18  SpO2 99%  LMP 01/26/2012  Breastfeeding? No  Physical Exam  Nursing note and vitals reviewed. Constitutional: She appears well-developed and well-nourished. No distress.  Abdominal: Soft.  Neurological: She is alert. No cranial nerve deficit. She exhibits normal muscle tone.  Skin: No rash noted.       ED Course    Procedures (including critical care time)   Labs Reviewed  POCT URINALYSIS DIP (DEVICE)   No results found.   1. Sacral radiculopathy   2. Paresthesia of both legs       MDM  Non-trauma- sacral radiculopathy pattern- with urinary and bladder of questionable dysfunction. No muscular weakness is noted on exam were reported by patient. Have transfer patient to the emergency department as patient is reporting a saddle- type pattern paresthesia. Differential diagnoses, includes cauda equina syndrome- vs  Meralgia - paresthesiae?  ( Although this last syndrome is not associated with any urinary or bowel movement dysfunction or sensorial deficits). Patient is hemodynamically stable.        Jimmie Molly, MD 02/02/12 1513  Jimmie Molly, MD 02/02/12 1523  Jimmie Molly, MD 02/02/12 1524

## 2012-02-02 NOTE — ED Notes (Addendum)
Pt c/o bilateral numbness and tingling sensation in feet. Numbness in vaginal area. And anus. Pt states unable to tell or feel when shes having/finished BM. Lower back pain. Pt states that symptoms started three days ago after she slept on floor with children. "same sensation that you would feel when you have had an epidural"

## 2012-02-04 LAB — POCT PREGNANCY, URINE: Preg Test, Ur: NEGATIVE

## 2012-02-11 ENCOUNTER — Inpatient Hospital Stay (HOSPITAL_COMMUNITY)
Admission: EM | Admit: 2012-02-11 | Discharge: 2012-02-15 | DRG: 060 | Disposition: A | Discharge status: Home / Self Care | Payer: Medicaid Other | Attending: Internal Medicine | Admitting: Internal Medicine

## 2012-02-11 ENCOUNTER — Emergency Department (HOSPITAL_COMMUNITY): Payer: Medicaid Other

## 2012-02-11 ENCOUNTER — Encounter (HOSPITAL_COMMUNITY): Payer: Self-pay | Admitting: *Deleted

## 2012-02-11 DIAGNOSIS — Z79899 Other long term (current) drug therapy: Secondary | ICD-10-CM

## 2012-02-11 DIAGNOSIS — R209 Unspecified disturbances of skin sensation: Secondary | ICD-10-CM

## 2012-02-11 DIAGNOSIS — R200 Anesthesia of skin: Secondary | ICD-10-CM | POA: Diagnosis present

## 2012-02-11 DIAGNOSIS — F17200 Nicotine dependence, unspecified, uncomplicated: Secondary | ICD-10-CM

## 2012-02-11 DIAGNOSIS — G35 Multiple sclerosis: Principal | ICD-10-CM

## 2012-02-11 DIAGNOSIS — F329 Major depressive disorder, single episode, unspecified: Secondary | ICD-10-CM | POA: Diagnosis present

## 2012-02-11 DIAGNOSIS — F411 Generalized anxiety disorder: Secondary | ICD-10-CM | POA: Diagnosis present

## 2012-02-11 DIAGNOSIS — F3289 Other specified depressive episodes: Secondary | ICD-10-CM | POA: Diagnosis present

## 2012-02-11 DIAGNOSIS — F418 Other specified anxiety disorders: Secondary | ICD-10-CM

## 2012-02-11 DIAGNOSIS — Z720 Tobacco use: Secondary | ICD-10-CM | POA: Diagnosis present

## 2012-02-11 DIAGNOSIS — K219 Gastro-esophageal reflux disease without esophagitis: Secondary | ICD-10-CM | POA: Diagnosis present

## 2012-02-11 DIAGNOSIS — K5900 Constipation, unspecified: Secondary | ICD-10-CM | POA: Diagnosis present

## 2012-02-11 DIAGNOSIS — F32A Depression, unspecified: Secondary | ICD-10-CM

## 2012-02-11 LAB — COMPREHENSIVE METABOLIC PANEL
Alkaline Phosphatase: 42 U/L (ref 39–117)
BUN: 12 mg/dL (ref 6–23)
CO2: 26 mEq/L (ref 19–32)
Chloride: 99 mEq/L (ref 96–112)
Creatinine, Ser: 0.85 mg/dL (ref 0.50–1.10)
GFR calc Af Amer: >90 mL/min (ref >90)
GFR calc non Af Amer: >90 mL/min (ref >90)
Glucose, Bld: 83 mg/dL (ref 70–99)
Potassium: 3.5 mEq/L (ref 3.5–5.1)
Total Bilirubin: 0.3 mg/dL (ref 0.3–1.2)

## 2012-02-11 LAB — CBC WITH DIFFERENTIAL/PLATELET
Basophils Relative: 0 % (ref 0–1)
HCT: 38.1 % (ref 36.0–46.0)
Hemoglobin: 13.2 g/dL (ref 12.0–15.0)
Lymphocytes Relative: 36 % (ref 12–46)
Lymphs Abs: 3 10*3/uL (ref 0.7–4.0)
MCHC: 34.6 g/dL (ref 30.0–36.0)
Monocytes Absolute: 0.6 10*3/uL (ref 0.1–1.0)
Monocytes Relative: 7 % (ref 3–12)
Neutro Abs: 4.5 10*3/uL (ref 1.7–7.7)
RBC: 4.02 MIL/uL (ref 3.87–5.11)

## 2012-02-11 LAB — PROTIME-INR
INR: 0.99 (ref 0.00–1.49)
Prothrombin Time: 13 seconds (ref 11.6–15.2)

## 2012-02-11 MED ORDER — LORAZEPAM 2 MG/ML IJ SOLN
0.5000 mg | Freq: Once | INTRAMUSCULAR | Status: AC
  Administered 2012-02-11: 0.5 mg via INTRAVENOUS
  Filled 2012-02-11: qty 1

## 2012-02-11 MED ORDER — GADOBENATE DIMEGLUMINE 529 MG/ML IV SOLN
20.0000 mL | Freq: Once | INTRAVENOUS | Status: AC
  Administered 2012-02-11: 20 mL via INTRAVENOUS

## 2012-02-11 MED ORDER — LORAZEPAM 2 MG/ML IJ SOLN: 1.0000 mg | Freq: Once | INTRAMUSCULAR | Status: DC

## 2012-02-11 MED ORDER — HYDROMORPHONE HCL PF 1 MG/ML IJ SOLN
1.0000 mg | Freq: Once | INTRAMUSCULAR | Status: AC
  Administered 2012-02-11: 1 mg via INTRAVENOUS
  Filled 2012-02-11: qty 1

## 2012-02-11 MED ORDER — SODIUM CHLORIDE 0.9 % IV SOLN
1000.0000 mg | Freq: Every day | INTRAVENOUS | Status: AC
  Administered 2012-02-12 – 2012-02-15 (×5): 1000 mg via INTRAVENOUS
  Filled 2012-02-11 (×6): qty 8

## 2012-02-11 NOTE — Procedures (Addendum)
LP Procedure Note:  Patient has been seen and examined.  Chart has been reviewed.  LP is being performed to rule out multiple sclerosis.  Procedure has been explained to patient/family including risks and benefits.  Consent has been signed by patient/family and witnessed.   Blood pressure 142/86, pulse 62, temperature 98.2 F (36.8 C), temperature source Oral, resp. rate 20, last menstrual period 01/26/2012, SpO2 100.00%.  No current facility-administered medications for this encounter. Current outpatient prescriptions:citalopram (CELEXA) 40 MG tablet, Take 40 mg by mouth at bedtime. , Disp: , Rfl: ;  ibuprofen (ADVIL,MOTRIN) 200 MG tablet, Take 400 mg by mouth at bedtime as needed. For menstrual cramps, Disp: , Rfl: ;  LORazepam (ATIVAN) 2 MG tablet, Take 2 mg by mouth 2 (two) times daily as needed. For anxiety, Disp: , Rfl:    Basename 02/11/12 1701  WBC 8.3  HGB 13.2  HCT 38.1  PLT 233  INR 0.99  PTT 32    MRI of head: Several scattered nonspecific white matter type changes most notable periventricular region. In a patient of this age and gender and presenting symptoms, findings are suspicious for demyelinating process such as multiple sclerosis. None of these  areas enhance or demonstrate restricted motion as can be seen with areas of active demyelination.  No acute infarct. No intracranial hemorrhage    Patient was placed in the lateral decub position.  Area was cleaned with betadine and anesthetized with lidocaine.  Under sterile conditions 20G LP needle was placed at approximately L3-4 without difficulty.  Opening pressure was documented at 18.  Approximately 21cc of initial blood tinged fluid that cleared was obtained and sent for studies.  No complications were noted.    Solumedrol initiated at 1000mg  daily IV.  Thana Farr, MD [facility] [phone] 02/11/2012 11:34 PM

## 2012-02-11 NOTE — Consult Note (Signed)
NEURO HOSPITALIST CONSULT NOTE    Reason for Consult: new onset bilateral lower extremity paresthesias, bladder and bowel impairment.  HPI:   [name] is a 32 years old female, right handed, with a past medical history significant for anxiety, pregnancy induced hypertension, who was in her usual state of heath until 01/29/12 when she developed abrupt onset of numbness, coldness in her feet, and low back pain. She presented to the 4 days later and had a lumbar spine MRI that was unimpressive. She said that the numbness got worse and started spreading all the way up to her waist, genitalia area, buttocks, and anal area and simultaneously developed bladder incontinence and difficulty moving her bowels. No painful cramps, no spine pain with neck flexion. She denies weakness of her lower or upper extremities and tells me that she has been exercising to see if her symptoms go away. No paresthesias involving her upper extremities or her face. Denies double vision, vertigo, painful visual loss, imbalance, tremors, or difficulty swallowing. No recent fevers, chills, skin rash, spine trauma, or use of anticoagulants.                                                                                                                       Past Medical History  Diagnosis Date  . Pregnancy induced hypertension   . Anxiety     Past Surgical History  Procedure Date  . No past surgeries     Family History  Problem Relation Age of Onset  . Anesthesia problems Neg Hx   . Hypotension Neg Hx   . Malignant hyperthermia Neg Hx     Family History: no MS or other autoimmune disorders.   Social History:  reports that she has been smoking Cigarettes.  She has a 2 pack-year smoking history. She has never used smokeless tobacco. She reports that she does not drink alcohol or use illicit drugs.  No Known Allergies  MEDICATIONS:                                                                                                                      I have reviewed the patient's current medications.   ROS:  History obtained from the patient  General ROS: negative for - chills, fatigue, fever, night sweats, weight gain or weight loss Psychological ROS: negative for - behavioral disorder, hallucinations, memory difficulties, mood swings or suicidal ideation Ophthalmic ROS: negative for - blurry vision, double vision, eye pain or loss of vision ENT ROS: negative for - epistaxis, nasal discharge, oral lesions, sore throat, tinnitus or vertigo Allergy and Immunology ROS: negative for - hives or itchy/watery eyes Hematological and Lymphatic ROS: negative for - bleeding problems, bruising or swollen lymph nodes Endocrine ROS: negative for - galactorrhea, hair pattern changes, polydipsia/polyuria or temperature intolerance Respiratory ROS: negative for - cough, hemoptysis, shortness of breath or wheezing Cardiovascular ROS: negative for - chest pain, dyspnea on exertion, edema or irregular heartbeat Gastrointestinal ROS: negative for - abdominal pain, diarrhea, hematemesis, nausea/vomiting or stool incontinence Genito-Urinary ROS: negative for - dysuria, hematuria, incontinence or urinary frequency/urgency Musculoskeletal ROS: negative for - joint swelling or muscular weakness Neurological ROS: as noted in HPI Dermatological ROS: negative for rash and skin lesion changes   Blood pressure 150/97, pulse 96, temperature 98.2 F (36.8 C), temperature source Oral, resp. rate 14, last menstrual period 01/26/2012, SpO2 100.00%.   Neurologic Examination:                                                                        Alert, awake ,and oriented x 4. Comprehension, naming, and repetition normal. No right to left confusion. Speech fluent without evidence  of dysarthria. No dysphasia.  Cranial Nerves:  II: Discs flat bilaterally; Visual fields grossly normal, pupils equal, round, reactive to light and accommodation  III,IV, VI: ptosis not present, extra-ocular motions intact bilaterally  V,VII: smile symmetric, facial light touch sensation normal bilaterally  VIII: hearing diminished bilaterally  IX,X: gag reflex present  XI: bilateral shoulder shrug  XII: midline tongue extension  Motor:  5/5 all over proximally and distally.  Tone and bulk normal.  Sensory: Pinprick and light touch diminished from the umbilicus area downward. Deep Tendon Reflexes: slightly overactive throughout but no plantar clonus. Plantars:  Right: downgoing Left: downgoing  Cerebellar: finger to nose and heel to shin normal.  Gait: no ataxia. CV: pulses palpable throughout    No results found for this basename: cbc, bmp, coags, chol, tri, ldl, hga1c    No results found for this or any previous visit (from the past 48 hour(s)).  No results found.   Assessment/Plan: Very pleasant 32 years old female with recent onset of progressive, worsening paresthesias from the waist down in association with impaired pinprick and light touch from around T10 all the way downward. No muscle weakness. Slightly overactive DTR's all over. Her syndrome seems to pinpoint to the thoracic spinal cord, but I think in this age population a demyelinating disorder is high in the differential diagnosis and therefore will suggest MRI brain and cervico-thoracic spine with and without contrast first and then spinal tap if the neuro-imaging findings do not support a diagnosis of MS. IV solumedrol after MRI. Will follow up. Thanks for this consult.       Wyatt Portela, MD [facility] [phone]  02/11/2012, 5:02 PM

## 2012-02-11 NOTE — ED Notes (Signed)
Pt sent by Neurosurgeon-Dr Jeral Fruit.  Reports that he is requesting a spinal tap to r/o MS vs lupus.  States that she has been having incontinence, numbness and tingling in bilateral legs.  Pt ambulatory.

## 2012-02-11 NOTE — ED Notes (Signed)
Pt presents with similar symptoms as when seen here on 1/11.  Pt has lower back pain and numbness to bilateral legs.  Pt states she occasionally loses control of bladder and is unable to feel when she needs to have a bowel movement.  Pt is able to walk but states she is numb from the waist down.  Pedal pulses strong and present.

## 2012-02-11 NOTE — ED Notes (Signed)
Patient transported to MRI 

## 2012-02-11 NOTE — H&P (Signed)
Patricia Jensen is an 32 y.o. female. Patient was seen and examined on February 11, 2012. PCP - none.   Chief Complaint: Tingling and numbness of the lower extremity. HPI: 32 year-old female with past medical history of preeclampsia presents with complains of persistent tingling and numbness of the lower extremity which extends up to her low back. Her symptoms started almost 10 days ago had initially come to the ER had lumbosacral spine MRI which did not show any acute. She was further referred to the ER because of the symptoms were persistent and at this time neurologist had evaluated the patient and MRA of the brain C-spine and T-spine were done. MRI shows concerns for multiple sclerosis. Patient is about to get a lumbar puncture done. Patient has been admitted for further management. Patient states in addition to her numbness in the lower extremity patient also has incontinence of urine but denies any incontinence of bowel. Denies any pain at this time. Denies any headache fever chills.  Past Medical History  Diagnosis Date  . Pregnancy induced hypertension   . Anxiety     Past Surgical History  Procedure Date  . No past surgeries     Family History  Problem Relation Age of Onset  . Anesthesia problems Neg Hx   . Hypotension Neg Hx   . Malignant hyperthermia Neg Hx   . Diabetes Mellitus II Maternal Aunt    Social History:  reports that she has been smoking Cigarettes.  She has a 2 pack-year smoking history. She has never used smokeless tobacco. She reports that she does not drink alcohol or use illicit drugs.  Allergies: No Known Allergies   (Not in a hospital admission)  Results for orders placed during the hospital encounter of 02/11/12 (from the past 48 hour(s))  CBC WITH DIFFERENTIAL     Status: Normal   Collection Time   02/11/12  5:01 PM      Component Value Range Comment   WBC 8.3  4.0 - 10.5 K/uL    RBC 4.02  3.87 - 5.11 MIL/uL    Hemoglobin 13.2  12.0 - 15.0 g/dL    HCT 14.7  82.9 - 56.2 %    MCV 94.8  78.0 - 100.0 fL    MCH 32.8  26.0 - 34.0 pg    MCHC 34.6  30.0 - 36.0 g/dL    RDW 13.0  86.5 - 78.4 %    Platelets 233  150 - 400 K/uL    Neutrophils Relative 54  43 - 77 %    Neutro Abs 4.5  1.7 - 7.7 K/uL    Lymphocytes Relative 36  12 - 46 %    Lymphs Abs 3.0  0.7 - 4.0 K/uL    Monocytes Relative 7  3 - 12 %    Monocytes Absolute 0.6  0.1 - 1.0 K/uL    Eosinophils Relative 2  0 - 5 %    Eosinophils Absolute 0.2  0.0 - 0.7 K/uL    Basophils Relative 0  0 - 1 %    Basophils Absolute 0.0  0.0 - 0.1 K/uL   COMPREHENSIVE METABOLIC PANEL     Status: Normal   Collection Time   02/11/12  5:01 PM      Component Value Range Comment   Sodium 135  135 - 145 mEq/L    Potassium 3.5  3.5 - 5.1 mEq/L    Chloride 99  96 - 112 mEq/L    CO2 26  19 - 32 mEq/L    Glucose, Bld 83  70 - 99 mg/dL    BUN 12  6 - 23 mg/dL    Creatinine, Ser 7.82  0.50 - 1.10 mg/dL    Calcium 9.6  8.4 - 95.6 mg/dL    Total Protein 7.9  6.0 - 8.3 g/dL    Albumin 4.2  3.5 - 5.2 g/dL    AST 18  0 - 37 U/L    ALT 7  0 - 35 U/L    Alkaline Phosphatase 42  39 - 117 U/L    Total Bilirubin 0.3  0.3 - 1.2 mg/dL    GFR calc non Af Amer >90  >90 mL/min    GFR calc Af Amer >90  >90 mL/min    Mr Laqueta Jean Wo Contrast  02/11/2012  *RADIOLOGY REPORT*  Clinical Data: Abrupt onset of numbness 01/29/2012.  Numbness has become worse ascending with development of bladder incontinence.  MRI HEAD WITHOUT AND WITH CONTRAST  Technique:  Multiplanar, multiecho pulse sequences of the brain and surrounding structures were obtained according to standard protocol without and with intravenous contrast  Contrast: 20mL MULTIHANCE GADOBENATE DIMEGLUMINE 529 MG/ML IV SOLN  Comparison: No comparison brain MR.  Findings: No acute infarct.  No intracranial hemorrhage.  Several scattered nonspecific white matter type changes most notable periventricular region.  In a patient of this age and gender and presenting symptoms,  findings are suspicious for demyelinating process such as multiple sclerosis.  None of these areas enhance or demonstrate restricted motion as can be seen with areas of active demyelination.  Other causes of white matter type changes not entirely although felt less likely include that secondary to; vasculitis, inflammatory process, small vessel disease, prior trauma or migraine headaches.  Decreased signal intensity bone marrow may be related to patient's habitus.  Correlation with CBC to exclude anemia contributing to this finding may be considered.  Questionable 2 mm lesion within the pars intermedia of the pituitary gland possibly a tiny Rathke cleft cyst (series 29 image 10).  Attention to this when the patient has follow-up imaging for white matter findings.  Otherwise no evidence of intracranial mass lesion or abnormal enhancement.  Major intracranial vascular structures are patent.  Cervical medullary junction, pineal region and orbital structures unremarkable.  IMPRESSION: Findings raise possibility of multiple sclerosis as detailed above.   Original Report Authenticated By: Lacy Duverney, M.D.    Mr Cervical Spine W Wo Contrast  02/11/2012  *RADIOLOGY REPORT*  Clinical Data: Numbness.  Question multiple sclerosis.  MRI CERVICAL SPINE WITHOUT AND WITH CONTRAST  Technique:  Multiplanar and multiecho pulse sequences of the cervical spine, to include the craniocervical junction and cervicothoracic junction, were obtained according to standard protocol without and with intravenous contrast.  Contrast: 20mL MULTIHANCE GADOBENATE DIMEGLUMINE 529 MG/ML IV SOLN  Comparison: MR brain performed the same date.  Findings: Questionable nodularity of the thyroid gland incompletely assessed.  This can be evaluated with elective thyroid ultrasound.  Cervical medullary junction unremarkable.  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  Series 13 image 6 raises possibility of altered signal intensity within the cervical cord at  the C4-5 and C5 level.  This is difficult to confirm as a true finding on the other sequences and may be related to artifact rather than a demyelinating plaque.  C2-3:  Negative.  C3-4:  Minimal bulge.  C4-5:  Mild bulge.  C5-6:  Small central disc protrusion with cord contact and minimal deformity.  C6-7:  Minimal bulge.  C7-T1:  Negative.  IMPRESSION: Series 13 image 6 raises possibility of altered signal intensity within the cervical cord at the C4-5 and C5 level.  This is difficult to confirm as a true finding on the other sequences and may be related to artifact rather than a demyelinating plaque.  C5-6 small central disc protrusion with cord contact and minimal deformity.  Please see above.   Original Report Authenticated By: Lacy Duverney, M.D.    Mr Thoracic Spine W Wo Contrast  02/11/2012  *RADIOLOGY REPORT*  Clinical Data: Numbness.  Rule out multiple sclerosis.  MRI THORACIC SPINE WITHOUT AND WITH CONTRAST  Technique:  Multiplanar and multiecho pulse sequences of the thoracic spine were obtained without and with intravenous contrast.  Contrast: 20mL MULTIHANCE GADOBENATE DIMEGLUMINE 529 MG/ML IV SOLN  Comparison: MR brain and cervical spine same date. Lumbar spine MR 02/02/2012.  Findings: Altered signal intensity within the cord T8-9 to mid T9 level and T10 level suggestive of a demyelinating process such as multiple sclerosis.  Other causes of cord signal abnormality such as that secondary to inflammatory process (such as sarcoidosis) or infectious process (such as Lymes disease) felt to be less likely considerations. Minimal linear enhancement along the dorsal aspect of the T8-T9 lesion may represent a vessel rather than enhancement of the cord signal abnormality.  Nodularity right lung (series 23 image 13 and series 27 image 14) may represent artifact although a true nodule is not entirely excluded.  T2-3:  Tiny left paracentral protrusion.  T3-4:  Small broad-based right post lateral protrusion  with minimal right sided cord flattening.  IMPRESSION: Altered signal intensity within the cord T8-9 to mid T9 level and T10 level raises possibility of a demyelinating process such as multiple sclerosis in a patient of this age and gender, presenting symptoms and brain MR findings. Clinical and laboratory correlation recommended.  T2-3 tiny left paracentral protrusion.  T3-4 small broad-based right post lateral protrusion with minimal right sided cord flattening.  9 mm nodularity right lung may represent an artifact.   True nodule not entirely excluded.   Original Report Authenticated By: Lacy Duverney, M.D.     Review of Systems  Constitutional: Negative.   HENT: Negative.   Eyes: Negative.   Respiratory: Negative.   Cardiovascular: Negative.   Gastrointestinal: Negative.   Genitourinary: Negative.   Musculoskeletal: Negative.   Skin: Negative.   Neurological: Positive for tingling (tingling and numbness of the lower extremities.).  Endo/Heme/Allergies: Negative.   Psychiatric/Behavioral: Negative.     Blood pressure 142/86, pulse 62, temperature 98.2 F (36.8 C), temperature source Oral, resp. rate 20, last menstrual period 01/26/2012, SpO2 100.00%. Physical Exam  Constitutional: She is oriented to person, place, and time. She appears well-developed and well-nourished. No distress.  HENT:  Head: Normocephalic and atraumatic.  Right Ear: External ear normal.  Left Ear: External ear normal.  Nose: Nose normal.  Mouth/Throat: Oropharynx is clear and moist. No oropharyngeal exudate.  Eyes: Conjunctivae normal are normal. Pupils are equal, round, and reactive to light. Right eye exhibits no discharge. Left eye exhibits no discharge. No scleral icterus.  Neck: Normal range of motion. Neck supple.  Cardiovascular: Normal rate and regular rhythm.   Respiratory: Effort normal and breath sounds normal. No respiratory distress. She has no wheezes. She has no rales.  GI: Soft. Bowel sounds are  normal. She exhibits no distension. There is no tenderness. There is no rebound.  Musculoskeletal: She exhibits no edema and no tenderness.  Neurological:  She is alert and oriented to person, place, and time.       Moves all extremities 5/5. No facial asymmetry. Tongue is midline.  Skin: Skin is warm and dry. She is not diaphoretic.     Assessment/Plan #1. Tingling and numbness of the lower extremities concerning for multiple sclerosis - patient will be admitted and placed on neurochecks. Patient is about to get a lumbar puncture done by neurologist. Further recommendations per neurology. #2. Tobacco abuse - patient advised to quit smoking.  CODE STATUS - full code.  Eduard Clos 02/11/2012, 10:21 PM

## 2012-02-11 NOTE — ED Provider Notes (Signed)
History     CSN: [id number]  Arrival date & time 02/11/12  1439   First MD Initiated Contact with Patient 02/11/12 1546      Chief Complaint  Patient presents with  . Back Problem    (Consider location/radiation/quality/duration/timing/severity/associated sxs/prior treatment) The history is provided by the patient.  SENAI RAMNATH is a 32 y.o. female here with numbness and tingling. She's been having bilateral leg numbness and tingling for the last 10 days. She had an MRI of her lumbar spine a week ago that didn't show any spinal impingement. She still feels numb and tingling. She also has urinary and stool incontinence that has not improved. She saw Dr. Jeral Fruit from neurosurgery it was sent for rule out MS or lupus. She has chronic blurry vision that has not been getting worse.    Past Medical History  Diagnosis Date  . Pregnancy induced hypertension   . Anxiety     Past Surgical History  Procedure Date  . No past surgeries     Family History  Problem Relation Age of Onset  . Anesthesia problems Neg Hx   . Hypotension Neg Hx   . Malignant hyperthermia Neg Hx     History  Substance Use Topics  . Smoking status: Current Every Day Smoker -- 0.2 packs/day for 8 years    Types: Cigarettes  . Smokeless tobacco: Never Used  . Alcohol Use: No    OB History    Grav Para Term Preterm Abortions TAB SAB Ect Mult Living   3 3 3  0 0 0 0 0 0 3      Review of Systems  Genitourinary:       Incontinence   Neurological: Positive for numbness.  All other systems reviewed and are negative.    Allergies  Review of patient's allergies indicates no known allergies.  Home Medications   Current Outpatient Rx  Name  Route  Sig  Dispense  Refill  . CITALOPRAM HYDROBROMIDE 40 MG PO TABS   Oral   Take 40 mg by mouth at bedtime.          . IBUPROFEN 200 MG PO TABS   Oral   Take 400 mg by mouth at bedtime as needed. For menstrual cramps         . LORAZEPAM 2 MG PO TABS   Oral   Take 2 mg by mouth 2 (two) times daily as needed. For anxiety           BP 142/86  Pulse 62  Temp 98.2 F (36.8 C) (Oral)  Resp 20  SpO2 100%  LMP 01/26/2012  Physical Exam  Nursing note and vitals reviewed. Constitutional: She is oriented to person, place, and time. She appears well-developed and well-nourished.       Slightly anxious   HENT:  Head: Normocephalic.  Mouth/Throat: Oropharynx is clear and moist.  Eyes: Conjunctivae normal are normal. Pupils are equal, round, and reactive to light.  Neck: Normal range of motion. Neck supple.  Cardiovascular: Normal rate, regular rhythm and normal heart sounds.   Pulmonary/Chest: Effort normal and breath sounds normal. No respiratory distress. She has no wheezes. She has no rales.  Abdominal: Soft. Bowel sounds are normal. She exhibits no distension. There is no tenderness. There is no rebound.  Musculoskeletal: Normal range of motion.  Neurological: She is alert and oriented to person, place, and time.       Dec sensation diffuse bilateral lower extremities. ? Dec reflexes. Nl  motor function.   Skin: Skin is warm and dry.  Psychiatric: She has a normal mood and affect. Her behavior is normal. Judgment and thought content normal.    ED Course  Procedures (including critical care time)   Labs Reviewed  CBC WITH DIFFERENTIAL  COMPREHENSIVE METABOLIC PANEL  PROTIME-INR  APTT  ANA  B. BURGDORFI ANTIBODIES  HEAVY METALS SCREEN, URINE  TSH  SEDIMENTATION RATE  PROTEIN AND GLUCOSE, CSF  CSF CELL COUNT WITH DIFFERENTIAL  VDRL, CSF  GRAM STAIN  FUNGAL STAIN  CSF CULTURE  OLIGOCLONAL BANDS, CSF + SERM  CSF IGG INDEX  CRYPTOCOCCAL ANTIGEN, CSF  B. BURGDORFI ANTIBODIES, CSF  MYELIN BASIC PROTEIN  ANGIOTENSIN CONVERTING ENZYME   Mr Laqueta Jean Wo Contrast  02/11/2012  *RADIOLOGY REPORT*  Clinical Data: Abrupt onset of numbness 01/29/2012.  Numbness has become worse ascending with development of bladder incontinence.   MRI HEAD WITHOUT AND WITH CONTRAST  Technique:  Multiplanar, multiecho pulse sequences of the brain and surrounding structures were obtained according to standard protocol without and with intravenous contrast  Contrast: 20mL MULTIHANCE GADOBENATE DIMEGLUMINE 529 MG/ML IV SOLN  Comparison: No comparison brain MR.  Findings: No acute infarct.  No intracranial hemorrhage.  Several scattered nonspecific white matter type changes most notable periventricular region.  In a patient of this age and gender and presenting symptoms, findings are suspicious for demyelinating process such as multiple sclerosis.  None of these areas enhance or demonstrate restricted motion as can be seen with areas of active demyelination.  Other causes of white matter type changes not entirely although felt less likely include that secondary to; vasculitis, inflammatory process, small vessel disease, prior trauma or migraine headaches.  Decreased signal intensity bone marrow may be related to patient's habitus.  Correlation with CBC to exclude anemia contributing to this finding may be considered.  Questionable 2 mm lesion within the pars intermedia of the pituitary gland possibly a tiny Rathke cleft cyst (series 29 image 10).  Attention to this when the patient has follow-up imaging for white matter findings.  Otherwise no evidence of intracranial mass lesion or abnormal enhancement.  Major intracranial vascular structures are patent.  Cervical medullary junction, pineal region and orbital structures unremarkable.  IMPRESSION: Findings raise possibility of multiple sclerosis as detailed above.   Original Report Authenticated By: Lacy Duverney, M.D.    Mr Cervical Spine W Wo Contrast  02/11/2012  *RADIOLOGY REPORT*  Clinical Data: Numbness.  Question multiple sclerosis.  MRI CERVICAL SPINE WITHOUT AND WITH CONTRAST  Technique:  Multiplanar and multiecho pulse sequences of the cervical spine, to include the craniocervical junction and  cervicothoracic junction, were obtained according to standard protocol without and with intravenous contrast.  Contrast: 20mL MULTIHANCE GADOBENATE DIMEGLUMINE 529 MG/ML IV SOLN  Comparison: MR brain performed the same date.  Findings: Questionable nodularity of the thyroid gland incompletely assessed.  This can be evaluated with elective thyroid ultrasound.  Cervical medullary junction unremarkable.  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  Series 13 image 6 raises possibility of altered signal intensity within the cervical cord at the C4-5 and C5 level.  This is difficult to confirm as a true finding on the other sequences and may be related to artifact rather than a demyelinating plaque.  C2-3:  Negative.  C3-4:  Minimal bulge.  C4-5:  Mild bulge.  C5-6:  Small central disc protrusion with cord contact and minimal deformity.  C6-7:  Minimal bulge.  C7-T1:  Negative.  IMPRESSION: Series 13 image  6 raises possibility of altered signal intensity within the cervical cord at the C4-5 and C5 level.  This is difficult to confirm as a true finding on the other sequences and may be related to artifact rather than a demyelinating plaque.  C5-6 small central disc protrusion with cord contact and minimal deformity.  Please see above.   Original Report Authenticated By: Lacy Duverney, M.D.    Mr Thoracic Spine W Wo Contrast  02/11/2012  *RADIOLOGY REPORT*  Clinical Data: Numbness.  Rule out multiple sclerosis.  MRI THORACIC SPINE WITHOUT AND WITH CONTRAST  Technique:  Multiplanar and multiecho pulse sequences of the thoracic spine were obtained without and with intravenous contrast.  Contrast: 20mL MULTIHANCE GADOBENATE DIMEGLUMINE 529 MG/ML IV SOLN  Comparison: MR brain and cervical spine same date. Lumbar spine MR 02/02/2012.  Findings: Altered signal intensity within the cord T8-9 to mid T9 level and T10 level suggestive of a demyelinating process such as multiple sclerosis.  Other causes of cord signal abnormality such  as that secondary to inflammatory process (such as sarcoidosis) or infectious process (such as Lymes disease) felt to be less likely considerations. Minimal linear enhancement along the dorsal aspect of the T8-T9 lesion may represent a vessel rather than enhancement of the cord signal abnormality.  Nodularity right lung (series 23 image 13 and series 27 image 14) may represent artifact although a true nodule is not entirely excluded.  T2-3:  Tiny left paracentral protrusion.  T3-4:  Small broad-based right post lateral protrusion with minimal right sided cord flattening.  IMPRESSION: Altered signal intensity within the cord T8-9 to mid T9 level and T10 level raises possibility of a demyelinating process such as multiple sclerosis in a patient of this age and gender, presenting symptoms and brain MR findings. Clinical and laboratory correlation recommended.  T2-3 tiny left paracentral protrusion.  T3-4 small broad-based right post lateral protrusion with minimal right sided cord flattening.  9 mm nodularity right lung may represent an artifact.   True nodule not entirely excluded.   Original Report Authenticated By: Lacy Duverney, M.D.      No diagnosis found.    MDM  TINE MABEE is a 32 y.o. female here with r/o MS vs lupus. I called Dr. Lenn Cal from neuro, who will see the patient.   9:42 PM Dr. Thad Ranger (Dr. Dale Matawan partner) will do LP. MRI showed possible MS. I called Dr. Toniann Fail, who accepted the patient on tele.         Richardean Canal, MD 02/11/12 [phone]

## 2012-02-11 NOTE — ED Notes (Signed)
Pt returned from MRI °

## 2012-02-12 DIAGNOSIS — G35 Multiple sclerosis: Principal | ICD-10-CM

## 2012-02-12 DIAGNOSIS — F329 Major depressive disorder, single episode, unspecified: Secondary | ICD-10-CM

## 2012-02-12 LAB — BASIC METABOLIC PANEL
BUN: 14 mg/dL (ref 6–23)
Chloride: 100 mEq/L (ref 96–112)
GFR calc Af Amer: >90 mL/min (ref >90)
GFR calc non Af Amer: >90 mL/min (ref >90)
Potassium: 3.9 mEq/L (ref 3.5–5.1)
Sodium: 136 mEq/L (ref 135–145)

## 2012-02-12 LAB — CSF CELL COUNT WITH DIFFERENTIAL
RBC Count, CSF: 1587 /mm3 — ABNORMAL HIGH
WBC, CSF: 7 /mm3 — ABNORMAL HIGH (ref 0–5)

## 2012-02-12 LAB — CRYPTOCOCCAL ANTIGEN, CSF: Crypto Ag: NEGATIVE

## 2012-02-12 LAB — CBC
HCT: 38.3 % (ref 36.0–46.0)
Hemoglobin: 13 g/dL (ref 12.0–15.0)
RBC: 4.04 MIL/uL (ref 3.87–5.11)

## 2012-02-12 LAB — GLUCOSE, CAPILLARY
Glucose-Capillary: 151 mg/dL — ABNORMAL HIGH (ref 70–99)
Glucose-Capillary: 256 mg/dL — ABNORMAL HIGH (ref 70–99)

## 2012-02-12 LAB — TSH: TSH: 4.7 u[IU]/mL — ABNORMAL HIGH (ref 0.350–4.500)

## 2012-02-12 LAB — GRAM STAIN

## 2012-02-12 LAB — SEDIMENTATION RATE: Sed Rate: 9 mm/hr (ref 0–22)

## 2012-02-12 LAB — ANGIOTENSIN CONVERTING ENZYME: Angiotensin-Converting Enzyme: 26 U/L (ref 8–52)

## 2012-02-12 MED ORDER — CITALOPRAM HYDROBROMIDE 40 MG PO TABS
40.0000 mg | ORAL_TABLET | Freq: Every day | ORAL | Status: DC
  Administered 2012-02-12 – 2012-02-14 (×4): 40 mg via ORAL
  Filled 2012-02-12 (×5): qty 1

## 2012-02-12 MED ORDER — HEPARIN SODIUM (PORCINE) 5000 UNIT/ML IJ SOLN
5000.0000 [IU] | Freq: Three times a day (TID) | INTRAMUSCULAR | Status: DC
  Administered 2012-02-13 – 2012-02-15 (×7): 5000 [IU] via SUBCUTANEOUS
  Filled 2012-02-12 (×10): qty 1

## 2012-02-12 MED ORDER — ACETAMINOPHEN 650 MG RE SUPP: 650.0000 mg | Freq: Four times a day (QID) | RECTAL | Status: DC | PRN

## 2012-02-12 MED ORDER — NICOTINE 21 MG/24HR TD PT24
21.0000 mg | MEDICATED_PATCH | Freq: Every day | TRANSDERMAL | Status: DC
  Administered 2012-02-12 – 2012-02-15 (×4): 21 mg via TRANSDERMAL
  Filled 2012-02-12 (×4): qty 1

## 2012-02-12 MED ORDER — SODIUM CHLORIDE 0.9 % IJ SOLN
3.0000 mL | Freq: Two times a day (BID) | INTRAMUSCULAR | Status: DC
  Administered 2012-02-12 – 2012-02-15 (×8): 3 mL via INTRAVENOUS

## 2012-02-12 MED ORDER — ONDANSETRON HCL 4 MG/2ML IJ SOLN: 4.0000 mg | Freq: Three times a day (TID) | INTRAMUSCULAR | Status: DC | PRN

## 2012-02-12 MED ORDER — ONDANSETRON HCL 4 MG PO TABS: 4.0000 mg | ORAL_TABLET | Freq: Four times a day (QID) | ORAL | Status: DC | PRN

## 2012-02-12 MED ORDER — ONDANSETRON HCL 4 MG/2ML IJ SOLN: 4.0000 mg | Freq: Four times a day (QID) | INTRAMUSCULAR | Status: DC | PRN

## 2012-02-12 MED ORDER — PANTOPRAZOLE SODIUM 40 MG IV SOLR
40.0000 mg | Freq: Every day | INTRAVENOUS | Status: DC
  Administered 2012-02-12 (×2): 40 mg via INTRAVENOUS
  Filled 2012-02-12 (×3): qty 40

## 2012-02-12 MED ORDER — INSULIN ASPART 100 UNIT/ML ~~LOC~~ SOLN
0.0000 [IU] | Freq: Three times a day (TID) | SUBCUTANEOUS | Status: DC
  Administered 2012-02-12: 5 [IU] via SUBCUTANEOUS
  Administered 2012-02-13 – 2012-02-14 (×2): 2 [IU] via SUBCUTANEOUS
  Administered 2012-02-15: 1 [IU] via SUBCUTANEOUS

## 2012-02-12 MED ORDER — HYDROMORPHONE HCL PF 1 MG/ML IJ SOLN
1.0000 mg | INTRAMUSCULAR | Status: AC | PRN
  Administered 2012-02-12 (×2): 1 mg via INTRAVENOUS
  Filled 2012-02-12 (×2): qty 1

## 2012-02-12 MED ORDER — SODIUM CHLORIDE 0.9 % IV SOLN
INTRAVENOUS | Status: DC
  Administered 2012-02-12 – 2012-02-13 (×3): via INTRAVENOUS
  Administered 2012-02-13: 10 mL/h via INTRAVENOUS

## 2012-02-12 MED ORDER — ACETAMINOPHEN 325 MG PO TABS
650.0000 mg | ORAL_TABLET | Freq: Four times a day (QID) | ORAL | Status: DC | PRN
  Administered 2012-02-15: 650 mg via ORAL
  Filled 2012-02-12: qty 1

## 2012-02-12 MED ORDER — SODIUM CHLORIDE 0.9 % IV SOLN: INTRAVENOUS | Status: DC

## 2012-02-12 MED ORDER — POLYETHYLENE GLYCOL 3350 17 G PO PACK
17.0000 g | PACK | Freq: Every day | ORAL | Status: DC
  Administered 2012-02-12 – 2012-02-13 (×2): 17 g via ORAL
  Filled 2012-02-12 (×2): qty 1

## 2012-02-12 MED ORDER — LORAZEPAM 0.5 MG PO TABS
2.0000 mg | ORAL_TABLET | Freq: Two times a day (BID) | ORAL | Status: DC | PRN
  Administered 2012-02-12 – 2012-02-13 (×2): 2 mg via ORAL
  Filled 2012-02-12 (×3): qty 4

## 2012-02-12 MED ORDER — HYDROCODONE-ACETAMINOPHEN 5-325 MG PO TABS
1.0000 | ORAL_TABLET | Freq: Four times a day (QID) | ORAL | Status: DC | PRN
  Administered 2012-02-12 – 2012-02-13 (×5): 1 via ORAL
  Filled 2012-02-12 (×3): qty 1
  Filled 2012-02-12: qty 2
  Filled 2012-02-12: qty 1

## 2012-02-12 MED ORDER — DOCUSATE SODIUM 100 MG PO CAPS
100.0000 mg | ORAL_CAPSULE | Freq: Two times a day (BID) | ORAL | Status: DC
  Administered 2012-02-12 – 2012-02-13 (×2): 100 mg via ORAL
  Filled 2012-02-12 (×4): qty 1

## 2012-02-12 NOTE — Progress Notes (Addendum)
[facility]                                                                                Patient Demographics  Patricia Jensen, is a 32 y.o. female  ZOX:[id number]  WJX:[id number]  DOB - [birth year]  Admit date - 02/11/2012  Admitting Physician Eduard Clos, MD  Outpatient Primary MD for the patient is Default, Provider, MD  LOS - 1   Chief Complaint  Patient presents with  . Back Problem        Assessment & Plan    1. 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 Due to IV steroids sugars are slightly elevated we'll place her on sliding scale for now.    2.Tobacco abuse -counseled to quit. Provide nicotine patch for now.    Code Status: full  Family Communication: patient  Disposition Plan: home   Procedures MRI T. and L-spine, LP   Consults  Neurology   DVT Prophylaxis   SCDs for now, heparin from tomorrow since had LP yesterday  Lab Results  Component Value Date   PLT 208 02/12/2012    Medications  Scheduled Meds:   . citalopram  40 mg Oral QHS  . docusate sodium  100 mg Oral BID  . LORazepam  1 mg Intravenous Once  . methylPREDNISolone (SOLU-MEDROL) injection  1,000 mg Intravenous Daily  . nicotine  21 mg Transdermal Daily  . pantoprazole (PROTONIX) IV  40 mg Intravenous QHS  . polyethylene glycol  17 g Oral Daily  . sodium chloride  3 mL Intravenous Q12H   Continuous Infusions:   . sodium chloride 75 mL/hr at 02/12/12 0035   PRN Meds:.acetaminophen, acetaminophen, HYDROcodone-acetaminophen, HYDROmorphone (DILAUDID) injection, LORazepam, ondansetron (ZOFRAN) IV, ondansetron  Antibiotics     Anti-infectives    None       Time Spent in minutes  35   Jacqulynn Shappell K M.D on 02/12/2012 at 11:05 AM  Between 7am to 7pm - Pager  - [phone]  After 7pm go to [email or web address] - password [email or web address]  And look for the night coverage person covering for me after hours  [facility] Office  [phone]    Subjective:   Patricia Jensen today has, No headache, No chest pain, No abdominal pain - No Nausea, has waist below weakness tingling or numbness, some urinary incontinence, No Cough - SOB.   Objective:   Filed Vitals:   02/11/12 1445 02/11/12 2002 02/12/12 0019 02/12/12 0725  BP: 150/97 142/86 134/79 124/86  Pulse: 96 62 62 85  Temp: 98.2 F (36.8 C)   98.1 F (36.7 C)  TempSrc: Oral   Oral  Resp: 14 20  18   Height:   5\' 5"  (1.651 m)   Weight:   96.6 kg (212 lb 15.4 oz)   SpO2: 100% 100% 100% 100%    Wt Readings from Last 3 Encounters:  02/12/12 96.6 kg (212 lb 15.4 oz)  01/20/11 114.125 kg (251 lb 9.6 oz)  12/22/10 113.002 kg (249  lb 2 oz)     Intake/Output Summary (Last 24 hours) at 02/12/12 1105 Last data filed at 02/12/12 1103  Gross per 24 hour  Intake 906.75 ml  Output    500 ml  Net 406.75 ml    Exam Awake Alert, Oriented X 3,  Normal affect, reduced fine touch sensation waist below, lower extremity deep tendon flexors are brisk plantars are going down [place].AT,PERRAL Supple Neck,No JVD, No cervical lymphadenopathy appriciated.  Symmetrical Chest wall movement, Good air movement bilaterally, CTAB RRR,No Gallops,Rubs or new Murmurs, No Parasternal Heave +ve B.Sounds, Abd Soft, Non tender, No organomegaly appriciated, No rebound - guarding or rigidity. No Cyanosis, Clubbing or edema, No new Rash or bruise    Data Review   Micro Results Recent Results (from the past 240 hour(s))  GRAM STAIN     Status: Normal   Collection Time   02/11/12 11:33 PM      Component Value Range Status Comment   Specimen Description CSF   Final    Special Requests NO 4 8CC   Final    Gram Stain     Final    Value: NO ORGANISMS SEEN FEW WBC PRESENT, PREDOMINANTLY MONONUCLEAR CYTOSPUN CALLED RESULTS TO  A.MOORE RN 0040 02/12/2012 E.GADDY   Report Status 02/12/2012 FINAL   Final   CSF CULTURE     Status: Normal (Preliminary result)   Collection Time   02/11/12 11:33 PM      Component Value Range Status Comment   Specimen Description CSF   Final    Special Requests NO 4 8CC   Final    Gram Stain     Final    Value: WBC PRESENT, PREDOMINANTLY MONONUCLEAR     NO ORGANISMS SEEN     CYTOSPIN SLIDE Gram Stain Report Called to,Read Back By and Verified With: Gram Stain Report Called to,Read Back By and Verified With: A MOORE RN 0040 02/12/2012 BY E GADDY   Culture PENDING   Incomplete    Report Status PENDING   Incomplete     Radiology Reports Mr Lodema Pilot Contrast  02/11/2012  *RADIOLOGY REPORT*  Clinical Data: Abrupt onset of numbness 01/29/2012.  Numbness has become worse ascending with development of bladder incontinence.  MRI HEAD WITHOUT AND WITH CONTRAST  Technique:  Multiplanar, multiecho pulse sequences of the brain and surrounding structures were obtained according to standard protocol without and with intravenous contrast  Contrast: 20mL MULTIHANCE GADOBENATE DIMEGLUMINE 529 MG/ML IV SOLN  Comparison: No comparison brain MR.  Findings: No acute infarct.  No intracranial hemorrhage.  Several scattered nonspecific white matter type changes most notable periventricular region.  In a patient of this age and gender and presenting symptoms, findings are suspicious for demyelinating process such as multiple sclerosis.  None of these areas enhance or demonstrate restricted motion as can be seen with areas of active demyelination.  Other causes of white matter type changes not entirely although felt less likely include that secondary to; vasculitis, inflammatory process, small vessel disease, prior trauma or migraine headaches.  Decreased signal intensity bone marrow may be related to patient's habitus.  Correlation with CBC to exclude anemia contributing to this finding may be considered.  Questionable 2  mm lesion within the pars intermedia of the pituitary gland possibly a tiny Rathke cleft cyst (series 29 image 10).  Attention to this when the patient has follow-up imaging for white matter findings.  Otherwise no evidence of intracranial mass lesion or abnormal enhancement.  Major  intracranial vascular structures are patent.  Cervical medullary junction, pineal region and orbital structures unremarkable.  IMPRESSION: Findings raise possibility of multiple sclerosis as detailed above.   Original Report Authenticated By: Lacy Duverney, M.D.    Mr Lumbar Spine Wo Contrast  02/02/2012  *RADIOLOGY REPORT*  Clinical Data: 32 year old female with loss of sensation from waist down.  Perineum numbness.  Acute onset with no known injury.  MRI LUMBAR SPINE WITHOUT CONTRAST  Technique:  Multiplanar and multiecho pulse sequences of the lumbar spine were obtained without intravenous contrast.  Comparison: None.  Findings: Lumbar segmentation appears to be normal.  Vertebral height and alignment within normal limits.  Degenerative endplate changes in the lower lumbar spine, but No marrow edema or evidence of acute osseous abnormality.   Visualized lower thoracic spinal cord is normal with conus medularis at L1-L2.  Cauda equina nerve roots appear within normal limits.  Visualized paraspinal soft tissues are within normal limits. Negative visualized abdominal viscera except for intrinsic T1 hyperintensity in the gallbladder.  Query milk of calcium bile or sludge.  T10-T11:  Grossly negative.  T11-T12: Negative.  T12-L1:  Negative  L1-L2:  Negative  L2-L3:  Negative.  L3-L4:  Negative.  L4-L5:  Disc desiccation.  Circumferential disc bulge with superimposed small to moderate right paracentral and slightly caudal disc protrusion with annular tear.  Effacement the lateral recesses, more so the right.  Overall mild spinal stenosis.  No foraminal stenosis.  Trace fluid in both facet joints.  L5-S1:  Disc desiccation.  Small central  disc protrusion with annular tear.  This is in proximity to the S1 nerve roots but there is no nerve root displacement.  No spinal stenosis.  No foraminal involvement.  IMPRESSION: 1.  L4-L5 and L5-S1 disc degeneration.  Mild spinal and right lateral recess stenosis at the former. 2.  Otherwise negative lumbar spine. 3.  Evidence of complex fluid in the gallbladder, perhaps milk of calcium bile or sludge.   Original Report Authenticated By: Erskine Speed, M.D.    Mr Cervical Spine W Wo Contrast  02/11/2012  *RADIOLOGY REPORT*  Clinical Data: Numbness.  Question multiple sclerosis.  MRI CERVICAL SPINE WITHOUT AND WITH CONTRAST  Technique:  Multiplanar and multiecho pulse sequences of the cervical spine, to include the craniocervical junction and cervicothoracic junction, were obtained according to standard protocol without and with intravenous contrast.  Contrast: 20mL MULTIHANCE GADOBENATE DIMEGLUMINE 529 MG/ML IV SOLN  Comparison: MR brain performed the same date.  Findings: Questionable nodularity of the thyroid gland incompletely assessed.  This can be evaluated with elective thyroid ultrasound.  Cervical medullary junction unremarkable.  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  Series 13 image 6 raises possibility of altered signal intensity within the cervical cord at the C4-5 and C5 level.  This is difficult to confirm as a true finding on the other sequences and may be related to artifact rather than a demyelinating plaque.  C2-3:  Negative.  C3-4:  Minimal bulge.  C4-5:  Mild bulge.  C5-6:  Small central disc protrusion with cord contact and minimal deformity.  C6-7:  Minimal bulge.  C7-T1:  Negative.  IMPRESSION: Series 13 image 6 raises possibility of altered signal intensity within the cervical cord at the C4-5 and C5 level.  This is difficult to confirm as a true finding on the other sequences and may be related to artifact rather than a demyelinating plaque.  C5-6 small central disc protrusion with cord  contact and minimal deformity.  Please see above.   Original Report Authenticated By: Lacy Duverney, M.D.    Mr Thoracic Spine W Wo Contrast  02/11/2012  *RADIOLOGY REPORT*  Clinical Data: Numbness.  Rule out multiple sclerosis.  MRI THORACIC SPINE WITHOUT AND WITH CONTRAST  Technique:  Multiplanar and multiecho pulse sequences of the thoracic spine were obtained without and with intravenous contrast.  Contrast: 20mL MULTIHANCE GADOBENATE DIMEGLUMINE 529 MG/ML IV SOLN  Comparison: MR brain and cervical spine same date. Lumbar spine MR 02/02/2012.  Findings: Altered signal intensity within the cord T8-9 to mid T9 level and T10 level suggestive of a demyelinating process such as multiple sclerosis.  Other causes of cord signal abnormality such as that secondary to inflammatory process (such as sarcoidosis) or infectious process (such as Lymes disease) felt to be less likely considerations. Minimal linear enhancement along the dorsal aspect of the T8-T9 lesion may represent a vessel rather than enhancement of the cord signal abnormality.  Nodularity right lung (series 23 image 13 and series 27 image 14) may represent artifact although a true nodule is not entirely excluded.  T2-3:  Tiny left paracentral protrusion.  T3-4:  Small broad-based right post lateral protrusion with minimal right sided cord flattening.  IMPRESSION: Altered signal intensity within the cord T8-9 to mid T9 level and T10 level raises possibility of a demyelinating process such as multiple sclerosis in a patient of this age and gender, presenting symptoms and brain MR findings. Clinical and laboratory correlation recommended.  T2-3 tiny left paracentral protrusion.  T3-4 small broad-based right post lateral protrusion with minimal right sided cord flattening.  9 mm nodularity right lung may represent an artifact.   True nodule not entirely excluded.   Original Report Authenticated By: Lacy Duverney, M.D.     CBC  Lab 02/12/12 0540 02/11/12  1701  WBC 6.2 8.3  HGB 13.0 13.2  HCT 38.3 38.1  PLT 208 233  MCV 94.8 94.8  MCH 32.2 32.8  MCHC 33.9 34.6  RDW 13.5 13.5  LYMPHSABS -- 3.0  MONOABS -- 0.6  EOSABS -- 0.2  BASOSABS -- 0.0  BANDABS -- --    Chemistries   Lab 02/12/12 0540 02/11/12 1701  NA 136 135  K 3.9 3.5  CL 100 99  CO2 23 26  GLUCOSE 133* 83  BUN 14 12  CREATININE 0.83 0.85  CALCIUM 9.1 9.6  MG -- --  AST -- 18  ALT -- 7  ALKPHOS -- 42  BILITOT -- 0.3   ------------------------------------------------------------------------------------------------------------------ estimated creatinine clearance is 114.9 ml/min (by C-G formula based on Cr of 0.83). ------------------------------------------------------------------------------------------------------------------ No results found for this basename: HGBA1C:2 in the last 72 hours ------------------------------------------------------------------------------------------------------------------ No results found for this basename: CHOL:2,HDL:2,LDLCALC:2,TRIG:2,CHOLHDL:2,LDLDIRECT:2 in the last 72 hours ------------------------------------------------------------------------------------------------------------------  [facility] 02/11/12 2112  TSH 4.700*  T4TOTAL --  T3FREE --  THYROIDAB --   ------------------------------------------------------------------------------------------------------------------ No results found for this basename: VITAMINB12:2,FOLATE:2,FERRITIN:2,TIBC:2,IRON:2,RETICCTPCT:2 in the last 72 hours  Coagulation profile  Lab 02/11/12 2150  INR 0.99  PROTIME --    No results found for this basename: DDIMER:2 in the last 72 hours  Cardiac Enzymes No results found for this basename: CK:3,CKMB:3,TROPONINI:3,MYOGLOBIN:3 in the last 168 hours ------------------------------------------------------------------------------------------------------------------ No components found with this basename: POCBNP:3

## 2012-02-12 NOTE — Progress Notes (Signed)
NEURO HOSPITALIST PROGRESS NOTE   SUBJECTIVE:                                                                                                                        Neurologically unchanged. MRI thoracic spine showed areas of high signal intensity T8-9 and T10. Brain MRI with couple of non enhancing periventricular lesions no quite specific but concerning for demyelinating disease. Underwent spinal tap last night. Started on IV solumedrol.   OBJECTIVE:                                                                                                                           Vital signs in last 24 hours: Temp:  [98.1 F (36.7 C)-98.2 F (36.8 C)] 98.1 F (36.7 C) (01/21 0725) Pulse Rate:  [62-96] 85  (01/21 0725) Resp:  [14-20] 18  (01/21 0725) BP: (124-150)/(79-97) 124/86 mmHg (01/21 0725) SpO2:  [100 %] 100 % (01/21 0725) Weight:  [96.6 kg (212 lb 15.4 oz)] 96.6 kg (212 lb 15.4 oz) (01/21 0019)  Intake/Output from previous day: 01/20 0701 - 01/21 0700 In: 903.8 [P.O.:480; I.V.:423.8] Out: 0  Intake/Output this shift: Total I/O In: -  Out: 500 [Urine:500] Nutritional status: General  Past Medical History  Diagnosis Date  . Pregnancy induced hypertension   . Anxiety     Neurologic ROS negative with exception of above. Musculoskeletal ROS: low back pain.  Neurologic Exam:  Mental Status: Alert, awake ,and oriented x 4. Comprehension, naming, and repetition normal. No right to left confusion. Speech fluent without evidence of dysarthria. No dysphasia.  Cranial Nerves:  II: Discs flat bilaterally; Visual fields grossly normal, pupils equal, round, reactive to light and accommodation  III,IV, VI: ptosis not present, extra-ocular motions intact bilaterally  V,VII: smile symmetric, facial light touch sensation normal bilaterally  VIII: hearing diminished bilaterally  IX,X: gag reflex present  XI: bilateral shoulder shrug  XII: midline  tongue extension  Motor:  5/5 all over proximally and distally.  Tone and bulk normal.  Sensory: Pinprick and light touch diminished from the umbilicus area downward.  Deep Tendon Reflexes: slightly overactive throughout but no plantar clonus.  Plantars:  Right: downgoing Left: downgoing  Cerebellar: finger to nose and heel to  shin normal.  Gait: no ataxia.  CV: pulses palpable throughout     Lab Results: No results found for this basename: cbc, bmp, coags, chol, tri, ldl, hga1c   Lipid Panel No results found for this basename: CHOL,TRIG,HDL,CHOLHDL,VLDL,LDLCALC in the last 72 hours  Studies/Results: Mr Laqueta Jean Wo Contrast  02/11/2012  *RADIOLOGY REPORT*  Clinical Data: Abrupt onset of numbness 01/29/2012.  Numbness has become worse ascending with development of bladder incontinence.  MRI HEAD WITHOUT AND WITH CONTRAST  Technique:  Multiplanar, multiecho pulse sequences of the brain and surrounding structures were obtained according to standard protocol without and with intravenous contrast  Contrast: 20mL MULTIHANCE GADOBENATE DIMEGLUMINE 529 MG/ML IV SOLN  Comparison: No comparison brain MR.  Findings: No acute infarct.  No intracranial hemorrhage.  Several scattered nonspecific white matter type changes most notable periventricular region.  In a patient of this age and gender and presenting symptoms, findings are suspicious for demyelinating process such as multiple sclerosis.  None of these areas enhance or demonstrate restricted motion as can be seen with areas of active demyelination.  Other causes of white matter type changes not entirely although felt less likely include that secondary to; vasculitis, inflammatory process, small vessel disease, prior trauma or migraine headaches.  Decreased signal intensity bone marrow may be related to patient's habitus.  Correlation with CBC to exclude anemia contributing to this finding may be considered.  Questionable 2 mm lesion within the pars  intermedia of the pituitary gland possibly a tiny Rathke cleft cyst (series 29 image 10).  Attention to this when the patient has follow-up imaging for white matter findings.  Otherwise no evidence of intracranial mass lesion or abnormal enhancement.  Major intracranial vascular structures are patent.  Cervical medullary junction, pineal region and orbital structures unremarkable.  IMPRESSION: Findings raise possibility of multiple sclerosis as detailed above.   Original Report Authenticated By: Lacy Duverney, M.D.    Mr Cervical Spine W Wo Contrast  02/11/2012  *RADIOLOGY REPORT*  Clinical Data: Numbness.  Question multiple sclerosis.  MRI CERVICAL SPINE WITHOUT AND WITH CONTRAST  Technique:  Multiplanar and multiecho pulse sequences of the cervical spine, to include the craniocervical junction and cervicothoracic junction, were obtained according to standard protocol without and with intravenous contrast.  Contrast: 20mL MULTIHANCE GADOBENATE DIMEGLUMINE 529 MG/ML IV SOLN  Comparison: MR brain performed the same date.  Findings: Questionable nodularity of the thyroid gland incompletely assessed.  This can be evaluated with elective thyroid ultrasound.  Cervical medullary junction unremarkable.  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  Series 13 image 6 raises possibility of altered signal intensity within the cervical cord at the C4-5 and C5 level.  This is difficult to confirm as a true finding on the other sequences and may be related to artifact rather than a demyelinating plaque.  C2-3:  Negative.  C3-4:  Minimal bulge.  C4-5:  Mild bulge.  C5-6:  Small central disc protrusion with cord contact and minimal deformity.  C6-7:  Minimal bulge.  C7-T1:  Negative.  IMPRESSION: Series 13 image 6 raises possibility of altered signal intensity within the cervical cord at the C4-5 and C5 level.  This is difficult to confirm as a true finding on the other sequences and may be related to artifact rather than a  demyelinating plaque.  C5-6 small central disc protrusion with cord contact and minimal deformity.  Please see above.   Original Report Authenticated By: Lacy Duverney, M.D.    Mr Thoracic Spine W Wo  Contrast  02/11/2012  *RADIOLOGY REPORT*  Clinical Data: Numbness.  Rule out multiple sclerosis.  MRI THORACIC SPINE WITHOUT AND WITH CONTRAST  Technique:  Multiplanar and multiecho pulse sequences of the thoracic spine were obtained without and with intravenous contrast.  Contrast: 20mL MULTIHANCE GADOBENATE DIMEGLUMINE 529 MG/ML IV SOLN  Comparison: MR brain and cervical spine same date. Lumbar spine MR 02/02/2012.  Findings: Altered signal intensity within the cord T8-9 to mid T9 level and T10 level suggestive of a demyelinating process such as multiple sclerosis.  Other causes of cord signal abnormality such as that secondary to inflammatory process (such as sarcoidosis) or infectious process (such as Lymes disease) felt to be less likely considerations. Minimal linear enhancement along the dorsal aspect of the T8-T9 lesion may represent a vessel rather than enhancement of the cord signal abnormality.  Nodularity right lung (series 23 image 13 and series 27 image 14) may represent artifact although a true nodule is not entirely excluded.  T2-3:  Tiny left paracentral protrusion.  T3-4:  Small broad-based right post lateral protrusion with minimal right sided cord flattening.  IMPRESSION: Altered signal intensity within the cord T8-9 to mid T9 level and T10 level raises possibility of a demyelinating process such as multiple sclerosis in a patient of this age and gender, presenting symptoms and brain MR findings. Clinical and laboratory correlation recommended.  T2-3 tiny left paracentral protrusion.  T3-4 small broad-based right post lateral protrusion with minimal right sided cord flattening.  9 mm nodularity right lung may represent an artifact.   True nodule not entirely excluded.   Original Report  Authenticated By: Lacy Duverney, M.D.     MEDICATIONS                                                                                                                       I have reviewed the patient's current medications.  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 Of course, MS mimics need to be ruled. Continue IV solumedrol. Will follow up.  Wyatt Portela, MD [facility] [phone]  02/12/2012, 10:26 AM

## 2012-02-12 NOTE — Evaluation (Signed)
Physical Therapy Evaluation Patient Details Name: SPECIAL RANES MRN: [id number] DOB: July 01, 1980 Today's Date: 02/12/2012 Time: 4098-1191 PT Time Calculation (min): 25 min  PT Assessment / Plan / Recommendation Clinical Impression  Pt admitted with bil LE numbness and urinary incontinence with potential diagnosis of MS. Pt able to perform all baseline functions and educated for potential course of MS shoudl it be determined and potential benefit of therapy in the future but not currently. Pt encouraged to continue activity but not to point of overexertion or in extreme temperatures. Pt also educated to perform lower body visual inspection daily due to lack of sensation. Pt very pleasant and does not currently have therapy needs and biggest concern is her lack of control of bowel and bladder. Education provided for back precautions to attempt to help relieve lumbar pain. Pt demonstrated and verbalized understanding of all education. No further needs, pt agreeable and signing off.     PT Assessment  Patent does not need any further PT services    Follow Up Recommendations  No PT follow up    Does the patient have the potential to tolerate intense rehabilitation      Barriers to Discharge        Equipment Recommendations  None recommended by PT    Recommendations for Other Services     Frequency      Precautions / Restrictions Precautions Precautions: None   Pertinent Vitals/Pain 8/10 lumbar pain, repositioned, RN aware      Mobility  Bed Mobility Bed Mobility: Supine to Sit;Sit to Supine Supine to Sit: 7: Independent;HOB flat Sit to Supine: 7: Independent;HOB flat Details for Bed Mobility Assistance: pt educated for log rolling and sequence as education to assist with limiting back pain Transfers Transfers: Sit to Stand;Stand to Sit Sit to Stand: 7: Independent;From bed Stand to Sit: 7: Independent;To bed Ambulation/Gait Ambulation/Gait Assistance: 7:  Independent Ambulation Distance (Feet): 500 Feet Assistive device: None Gait Pattern: Within Functional Limits Gait velocity: WFL Stairs: No    Shoulder Instructions     Exercises     PT Diagnosis:    PT Problem List:   PT Treatment Interventions:     PT Goals    Visit Information  Last PT Received On: 02/12/12 Assistance Needed: +1    Subjective Data  Subjective: I just can't feel my lower body like I normally do Patient Stated Goal: I want to have the back pain gone and poop   Prior Functioning  Home Living Lives With: Spouse;Son;Daughter Available Help at Discharge: Family Type of Home: House Home Access: Level entry Home Layout: One level Bathroom Shower/Tub: Walk-in shower;Tub/shower unit Bathroom Toilet: Standard Home Adaptive Equipment: None Prior Function Level of Independence: Independent Able to Take Stairs?: Yes Driving: Yes Vocation: Other (comment) (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) Communication Communication: No difficulties    Cognition  Overall Cognitive Status: Appears within functional limits for tasks assessed/performed Arousal/Alertness: Awake/alert Orientation Level: Appears intact for tasks assessed Behavior During Session: [facility] for tasks performed    Extremity/Trunk Assessment Right Upper Extremity Assessment RUE ROM/Strength/Tone: Within functional levels Left Upper Extremity Assessment LUE ROM/Strength/Tone: Within functional levels Right Lower Extremity Assessment RLE ROM/Strength/Tone: Within functional levels (4+-5/5 for all myotomes) RLE Sensation: Deficits RLE Sensation Deficits: Pt able to sense light touch and localize but decreased sensation from T10 and below RLE Coordination: WFL - gross/fine motor Left Lower Extremity Assessment LLE ROM/Strength/Tone: Within functional levels (4+-5/5 for all myotomes) LLE Sensation: Deficits LLE  Coordination: WFL - gross/fine motor Trunk Assessment Trunk Assessment: Normal    Balance    End of Session PT - End of Session Equipment Utilized During Treatment: Gait belt Activity Tolerance: Patient tolerated treatment well Patient left: in bed;with call bell/phone within reach;with family/visitor present  GP     Toney Sang Beth 02/12/2012, 4:23 PM  Delaney Meigs, PT [phone]

## 2012-02-13 DIAGNOSIS — F341 Dysthymic disorder: Secondary | ICD-10-CM

## 2012-02-13 LAB — BASIC METABOLIC PANEL
BUN: 10 mg/dL (ref 6–23)
CO2: 26 mEq/L (ref 19–32)
Chloride: 106 mEq/L (ref 96–112)
Glucose, Bld: 125 mg/dL — ABNORMAL HIGH (ref 70–99)
Potassium: 4.2 mEq/L (ref 3.5–5.1)

## 2012-02-13 LAB — GLUCOSE, CAPILLARY: Glucose-Capillary: 116 mg/dL — ABNORMAL HIGH (ref 70–99)

## 2012-02-13 MED ORDER — HYDROCODONE-ACETAMINOPHEN 5-325 MG PO TABS
2.0000 | ORAL_TABLET | Freq: Four times a day (QID) | ORAL | Status: DC | PRN
  Administered 2012-02-13 – 2012-02-14 (×3): 2 via ORAL
  Filled 2012-02-13 (×3): qty 2

## 2012-02-13 MED ORDER — MINERAL OIL RE ENEM
1.0000 | ENEMA | Freq: Two times a day (BID) | RECTAL | Status: DC | PRN
  Administered 2012-02-13 – 2012-02-14 (×2): 1 via RECTAL
  Filled 2012-02-13 (×2): qty 1

## 2012-02-13 MED ORDER — POLYETHYLENE GLYCOL 3350 17 G PO PACK
17.0000 g | PACK | Freq: Two times a day (BID) | ORAL | Status: DC
  Administered 2012-02-13 – 2012-02-15 (×4): 17 g via ORAL
  Filled 2012-02-13 (×5): qty 1

## 2012-02-13 MED ORDER — BISACODYL 10 MG RE SUPP
10.0000 mg | Freq: Every day | RECTAL | Status: DC | PRN
  Administered 2012-02-13 – 2012-02-14 (×2): 10 mg via RECTAL
  Filled 2012-02-13 (×2): qty 1

## 2012-02-13 MED ORDER — PANTOPRAZOLE SODIUM 40 MG PO TBEC
40.0000 mg | DELAYED_RELEASE_TABLET | Freq: Every day | ORAL | Status: DC
  Administered 2012-02-13 – 2012-02-15 (×3): 40 mg via ORAL
  Filled 2012-02-13 (×3): qty 1

## 2012-02-13 MED ORDER — GABAPENTIN 100 MG PO CAPS
200.0000 mg | ORAL_CAPSULE | Freq: Three times a day (TID) | ORAL | Status: DC
  Administered 2012-02-13 – 2012-02-14 (×3): 200 mg via ORAL
  Filled 2012-02-13 (×5): qty 2

## 2012-02-13 MED ORDER — DOCUSATE SODIUM 100 MG PO CAPS
200.0000 mg | ORAL_CAPSULE | Freq: Two times a day (BID) | ORAL | Status: DC
  Administered 2012-02-13 – 2012-02-15 (×4): 200 mg via ORAL
  Filled 2012-02-13 (×5): qty 2

## 2012-02-13 NOTE — Progress Notes (Signed)
Subjective: Feels that urine control has slightly improved  Exam: Filed Vitals:   02/13/12 0517  BP: 147/101  Pulse: 87  Temp: 98.3 F (36.8 C)  Resp: 20   Gen: In bed, NAD MS: Awake, Alert, responds to questions and commands immediately.  XB:JYNWG, EOMI, face symmetric Motor: 5/5 thorughout Sensory: decreased to LT in legs, sways with romberg.   Impression: 32 yo F with lesions in the T-spine that are likely symptomatic as well as some lesions in the brain which coupled with the spinal lesions are very concernign for MS. She is getting steroids. Lyme titers negative.   Recommendations: 1) olig bands, IgG index, CSF ACE pending.  2) Continue steroids, day 3    Ritta Slot, MD [facility] [phone]  If 7pm- 7am, please page neurology on call at [phone].

## 2012-02-13 NOTE — Progress Notes (Signed)
[facility] PROGRESS NOTE  Patricia Jensen ZOX:[id number] DOB: 10-Jul-1980 DOA: 02/11/2012 PCP: Default, Provider, MD  Assessment/Plan: 1-LE weakness/numbness: high concerns for MS. -continue steroids IV -Adjust pain medications and start neurontin -Follow results from LP -Lyme titers negative -Continue supportive care  2-constipation: will adjust miralax and colace dose; will also start dulcolax  3-GI prophylaxis: continue PPI  4-Tobacco abuse: counseling provided. Continue nicotine patch.  5-Depression/anxiety: continue celexa and ativan  DVT: heparin  Code Status: Full Family Communication: no family at bedside Disposition Plan: home when medically stable   Consultants:  neurology  Procedures:  LP  Antibiotics:  none  HPI/Subjective: Afebrile; complaining of constipation. No abd pain, no nausea, no vomiting. Still with back pain and having numbness in her legs.  Objective: Filed Vitals:   02/12/12 1524 02/12/12 1628 02/12/12 2017 02/13/12 0517  BP: 142/89  148/94 147/101  Pulse: 99  83 87  Temp: 100.3 F (37.9 C) 98.7 F (37.1 C) 97.9 F (36.6 C) 98.3 F (36.8 C)  TempSrc: Oral  Oral Oral  Resp: 19  20 20   Height:      Weight:    98.6 kg (217 lb 6 oz)  SpO2: 100%  98% 85%    Intake/Output Summary (Last 24 hours) at 02/13/12 1309 Last data filed at 02/13/12 0947  Gross per 24 hour  Intake 2886.25 ml  Output   2400 ml  Net 486.25 ml   Filed Weights   02/12/12 0019 02/13/12 0517  Weight: 96.6 kg (212 lb 15.4 oz) 98.6 kg (217 lb 6 oz)    Exam:   General:  NAD, complaining of back pain and decrease sensation on her legs, no CP or SOB  Cardiovascular: S1 and S2, no rubs, no murmurs, no gallops  Respiratory: CTA bilaterally  Abdomen: soft, NT, ND, positive BS  Extremities: no edema  Neuro: decrease MS LE bilaterally 4/5; also decrease sensation to light touch; per patient waist down feeling numbed.  Data Reviewed: Basic Metabolic  Panel:  Lab 02/13/12 0515 02/12/12 0540 02/11/12 1701  NA 141 136 135  K 4.2 3.9 3.5  CL 106 100 99  CO2 26 23 26   GLUCOSE 125* 133* 83  BUN 10 14 12   CREATININE 0.78 0.83 0.85  CALCIUM 9.2 9.1 9.6  MG -- -- --  PHOS -- -- --   Liver Function Tests:  Lab 02/11/12 1701  AST 18  ALT 7  ALKPHOS 42  BILITOT 0.3  PROT 7.9  ALBUMIN 4.2   CBC:  Lab 02/12/12 0540 02/11/12 1701  WBC 6.2 8.3  NEUTROABS -- 4.5  HGB 13.0 13.2  HCT 38.3 38.1  MCV 94.8 94.8  PLT 208 233   CBG:  Lab 02/13/12 1054 02/13/12 0605 02/12/12 2109 02/12/12 1632 02/12/12 1156  GLUCAP 112* 116* 252* 256* 151*    Recent Results (from the past 240 hour(s))  GRAM STAIN     Status: Normal   Collection Time   02/11/12 11:33 PM      Component Value Range Status Comment   Specimen Description CSF   Final    Special Requests NO 4 8CC   Final    Gram Stain     Final    Value: NO ORGANISMS SEEN FEW WBC PRESENT, PREDOMINANTLY MONONUCLEAR CYTOSPUN CALLED RESULTS TO A.MOORE RN 0040 02/12/2012 E.GADDY   Report Status 02/12/2012 FINAL   Final   CSF CULTURE     Status: Normal (Preliminary result)   Collection Time   02/11/12  11:33 PM      Component Value Range Status Comment   Specimen Description CSF   Final    Special Requests NO 4 8CC   Final    Gram Stain     Final    Value: WBC PRESENT, PREDOMINANTLY MONONUCLEAR     NO ORGANISMS SEEN     CYTOSPIN SLIDE Gram Stain Report Called to,Read Back By and Verified With: Gram Stain Report Called to,Read Back By and Verified With: A MOORE RN 0040 02/12/2012 BY E GADDY   Culture PENDING   Incomplete    Report Status PENDING   Incomplete   FUNGUS CULTURE W SMEAR     Status: Normal (Preliminary result)   Collection Time   Feb 26, 2012 11:33 PM      Component Value Range Status Comment   Specimen Description CSF   Final    Special Requests NO 4 8CC   Final    Fungal Smear NO YEAST OR FUNGAL ELEMENTS SEEN   Final    Culture CULTURE IN PROGRESS FOR FOUR WEEKS   Final     Report Status PENDING   Incomplete      Studies: Mr Lodema Pilot Contrast  Feb 26, 2012  *RADIOLOGY REPORT*  Clinical Data: Abrupt onset of numbness 01/29/2012.  Numbness has become worse ascending with development of bladder incontinence.  MRI HEAD WITHOUT AND WITH CONTRAST  Technique:  Multiplanar, multiecho pulse sequences of the brain and surrounding structures were obtained according to standard protocol without and with intravenous contrast  Contrast: 20mL MULTIHANCE GADOBENATE DIMEGLUMINE 529 MG/ML IV SOLN  Comparison: No comparison brain MR.  Findings: No acute infarct.  No intracranial hemorrhage.  Several scattered nonspecific white matter type changes most notable periventricular region.  In a patient of this age and gender and presenting symptoms, findings are suspicious for demyelinating process such as multiple sclerosis.  None of these areas enhance or demonstrate restricted motion as can be seen with areas of active demyelination.  Other causes of white matter type changes not entirely although felt less likely include that secondary to; vasculitis, inflammatory process, small vessel disease, prior trauma or migraine headaches.  Decreased signal intensity bone marrow may be related to patient's habitus.  Correlation with CBC to exclude anemia contributing to this finding may be considered.  Questionable 2 mm lesion within the pars intermedia of the pituitary gland possibly a tiny Rathke cleft cyst (series 29 image 10).  Attention to this when the patient has follow-up imaging for white matter findings.  Otherwise no evidence of intracranial mass lesion or abnormal enhancement.  Major intracranial vascular structures are patent.  Cervical medullary junction, pineal region and orbital structures unremarkable.  IMPRESSION: Findings raise possibility of multiple sclerosis as detailed above.   Original Report Authenticated By: Lacy Duverney, M.D.    Mr Cervical Spine W Wo Contrast  February 26, 2012   *RADIOLOGY REPORT*  Clinical Data: Numbness.  Question multiple sclerosis.  MRI CERVICAL SPINE WITHOUT AND WITH CONTRAST  Technique:  Multiplanar and multiecho pulse sequences of the cervical spine, to include the craniocervical junction and cervicothoracic junction, were obtained according to standard protocol without and with intravenous contrast.  Contrast: 20mL MULTIHANCE GADOBENATE DIMEGLUMINE 529 MG/ML IV SOLN  Comparison: MR brain performed the same date.  Findings: Questionable nodularity of the thyroid gland incompletely assessed.  This can be evaluated with elective thyroid ultrasound.  Cervical medullary junction unremarkable.  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  Series 13 image 6 raises possibility of altered signal  intensity within the cervical cord at the C4-5 and C5 level.  This is difficult to confirm as a true finding on the other sequences and may be related to artifact rather than a demyelinating plaque.  C2-3:  Negative.  C3-4:  Minimal bulge.  C4-5:  Mild bulge.  C5-6:  Small central disc protrusion with cord contact and minimal deformity.  C6-7:  Minimal bulge.  C7-T1:  Negative.  IMPRESSION: Series 13 image 6 raises possibility of altered signal intensity within the cervical cord at the C4-5 and C5 level.  This is difficult to confirm as a true finding on the other sequences and may be related to artifact rather than a demyelinating plaque.  C5-6 small central disc protrusion with cord contact and minimal deformity.  Please see above.   Original Report Authenticated By: Lacy Duverney, M.D.    Mr Thoracic Spine W Wo Contrast  02/11/2012  *RADIOLOGY REPORT*  Clinical Data: Numbness.  Rule out multiple sclerosis.  MRI THORACIC SPINE WITHOUT AND WITH CONTRAST  Technique:  Multiplanar and multiecho pulse sequences of the thoracic spine were obtained without and with intravenous contrast.  Contrast: 20mL MULTIHANCE GADOBENATE DIMEGLUMINE 529 MG/ML IV SOLN  Comparison: MR brain and cervical  spine same date. Lumbar spine MR 02/02/2012.  Findings: Altered signal intensity within the cord T8-9 to mid T9 level and T10 level suggestive of a demyelinating process such as multiple sclerosis.  Other causes of cord signal abnormality such as that secondary to inflammatory process (such as sarcoidosis) or infectious process (such as Lymes disease) felt to be less likely considerations. Minimal linear enhancement along the dorsal aspect of the T8-T9 lesion may represent a vessel rather than enhancement of the cord signal abnormality.  Nodularity right lung (series 23 image 13 and series 27 image 14) may represent artifact although a true nodule is not entirely excluded.  T2-3:  Tiny left paracentral protrusion.  T3-4:  Small broad-based right post lateral protrusion with minimal right sided cord flattening.  IMPRESSION: Altered signal intensity within the cord T8-9 to mid T9 level and T10 level raises possibility of a demyelinating process such as multiple sclerosis in a patient of this age and gender, presenting symptoms and brain MR findings. Clinical and laboratory correlation recommended.  T2-3 tiny left paracentral protrusion.  T3-4 small broad-based right post lateral protrusion with minimal right sided cord flattening.  9 mm nodularity right lung may represent an artifact.   True nodule not entirely excluded.   Original Report Authenticated By: Lacy Duverney, M.D.     Scheduled Meds:   . citalopram  40 mg Oral QHS  . docusate sodium  200 mg Oral BID  . gabapentin  200 mg Oral TID  . heparin subcutaneous  5,000 Units Subcutaneous Q8H  . insulin aspart  0-9 Units Subcutaneous TID WC  . LORazepam  1 mg Intravenous Once  . methylPREDNISolone (SOLU-MEDROL) injection  1,000 mg Intravenous Daily  . nicotine  21 mg Transdermal Daily  . pantoprazole  40 mg Oral Q1200  . polyethylene glycol  17 g Oral BID  . sodium chloride  3 mL Intravenous Q12H   Continuous Infusions:   . sodium chloride 75 mL/hr  at 02/13/12 8657    Principal Problem:  *Lower extremity numbness Active Problems:  Tobacco abuse    Time spent: >30 minutes    Hayzlee Mcsorley  [facility] Pager [phone]. If 8PM-8AM, please contact night-coverage at [email or web address], password [facility] 02/13/2012, 1:09 PM  LOS: 2 days

## 2012-02-13 NOTE — Progress Notes (Signed)
Pt c/o of pain 8/10 and constipation.  Pt states she wants 2 vicodin instead of 1.   Dr. Gwenlyn Perking text/paged.

## 2012-02-13 NOTE — Progress Notes (Signed)
Pt requesting a enema for constipation and is stating the suppository is not working.  Dr. Gwenlyn Perking notified and ordered mineral oil enema to be given every 12 hours as needed if no result.  Will carry out MD orders.

## 2012-02-13 NOTE — Progress Notes (Signed)
Utilization Review Completed.   Saryiah Bencosme, RN, BSN Nurse Case Manager  [phone]  

## 2012-02-13 NOTE — Progress Notes (Signed)
Occupational Therapy Note  OT order received and appreciated.  Pt is very pleasant and reports she is having no difficulty performing ADLs at this time and feels she is at her baseline.  Per PT note, pt is independent with mobility.  Pt able to independently verbalize back precautions to help minimize pain (educated by PT).  Will sign off at this time as pt has no acute OT needs. Please re-order if pt experiences functional decline.  02/13/2012 Cipriano Mile OTR/L Pager [phone] Office [phone]

## 2012-02-14 DIAGNOSIS — K5900 Constipation, unspecified: Secondary | ICD-10-CM

## 2012-02-14 LAB — GLUCOSE, CAPILLARY
Glucose-Capillary: 152 mg/dL — ABNORMAL HIGH (ref 70–99)
Glucose-Capillary: 88 mg/dL (ref 70–99)

## 2012-02-14 MED ORDER — GABAPENTIN 400 MG PO CAPS
400.0000 mg | ORAL_CAPSULE | Freq: Three times a day (TID) | ORAL | Status: DC
Start: 2012-02-14 — End: 2012-02-15
  Administered 2012-02-14 – 2012-02-15 (×3): 400 mg via ORAL
  Filled 2012-02-14 (×5): qty 1

## 2012-02-14 MED ORDER — LACTULOSE 10 GM/15ML PO SOLN
30.0000 g | Freq: Two times a day (BID) | ORAL | Status: DC
  Administered 2012-02-14 – 2012-02-15 (×2): 30 g via ORAL
  Filled 2012-02-14 (×4): qty 45

## 2012-02-14 NOTE — Progress Notes (Signed)
Dr. Appointment made for [facility] February 24 11 AM-patient to be there at 10:30.  With Dr. Terrace Arabia.  Felicie Morn PA-C [facility] [phone]  02/14/2012, 2:11 PM

## 2012-02-14 NOTE — Progress Notes (Signed)
Subjective: Still has not had bowel movement.    Exam: Filed Vitals:   02/14/12 0557  BP: 155/103  Pulse: 55  Temp:   Resp:    Gen: In bed, NAD MS: Awake, Alert, responds to questions and commands immediately.  MW:UXLKG, EOMI, face symmetric Motor: 5/5 thorughout Sensory: decreased to LT in legs, sways with romberg.  Gait: able to tandem.   Impression: 32 yo F with lesions in the T-spine that are likely symptomatic as well as some lesions in the brain which coupled with the spinal lesions are very concernign for MS. She is getting steroids. Lyme titers negative.   Recommendations: 1) olig bands, IgG index, CSF ACE pending.  2) Continue steroids for total 5 doses of solumedrol.     Ritta Slot, MD [facility] [phone]  If 7pm- 7am, please page neurology on call at [phone].

## 2012-02-14 NOTE — Progress Notes (Signed)
[facility] PROGRESS NOTE  Patricia Jensen OVF:[id number] DOB: 03-30-1980 DOA: 02/11/2012 PCP: Default, Provider, MD  Assessment/Plan: 1-LE weakness/numbness: high concerns for MS. -continue steroids IV 4/5 doses -Adjust pain medications and continue neurontin -Follow results from LP (still pending olig bands, IgG index and CSF ACE). -Lyme titers negative -Continue supportive care  2-constipation: will continue miralax and colace dose; will start patient on lactulose  3-GI prophylaxis: continue PPI  4-Tobacco abuse: counseling provided. Continue nicotine patch.  5-Depression/anxiety: continue celexa and ativan  DVT: heparin  Code Status: Full Family Communication: no family at bedside Disposition Plan: home most likely tomorrow.   Consultants:  neurology  Procedures:  LP  Antibiotics:  none  HPI/Subjective: Afebrile; no BM yet. No abd pain, no nausea, no vomiting. Still with back pain and having numbness in her legs.  Objective: Filed Vitals:   02/13/12 2041 02/14/12 0554 02/14/12 0557 02/14/12 1428  BP: 151/93 154/104 155/103 135/81  Pulse: 71 57 55 84  Temp: 99 F (37.2 C) 98.8 F (37.1 C)  98.8 F (37.1 C)  TempSrc: Oral Oral  Oral  Resp: 20 20  20   Height:      Weight:  98.703 kg (217 lb 9.6 oz)    SpO2: 99% 98%  99%    Intake/Output Summary (Last 24 hours) at 02/14/12 1509 Last data filed at 02/14/12 1429  Gross per 24 hour  Intake    720 ml  Output   1400 ml  Net   -680 ml   Filed Weights   02/12/12 0019 02/13/12 0517 02/14/12 0554  Weight: 96.6 kg (212 lb 15.4 oz) 98.6 kg (217 lb 6 oz) 98.703 kg (217 lb 9.6 oz)    Exam:   General:  NAD, still complaining of back pain and decrease sensation on her legs, no CP or SOB  Cardiovascular: S1 and S2, no rubs, no murmurs, no gallops  Respiratory: CTA bilaterally  Abdomen: soft, NT, ND, positive BS  Extremities: no edema  Neuro: decrease MS LE bilaterally 4/5; also decrease  sensation to light touch; per patient waist down feeling numbed.  Data Reviewed: Basic Metabolic Panel:  Lab 02/13/12 8416 02/12/12 0540 02/11/12 1701  NA 141 136 135  K 4.2 3.9 3.5  CL 106 100 99  CO2 26 23 26   GLUCOSE 125* 133* 83  BUN 10 14 12   CREATININE 0.78 0.83 0.85  CALCIUM 9.2 9.1 9.6  MG -- -- --  PHOS -- -- --   Liver Function Tests:  Lab 02/11/12 1701  AST 18  ALT 7  ALKPHOS 42  BILITOT 0.3  PROT 7.9  ALBUMIN 4.2   CBC:  Lab 02/12/12 0540 02/11/12 1701  WBC 6.2 8.3  NEUTROABS -- 4.5  HGB 13.0 13.2  HCT 38.3 38.1  MCV 94.8 94.8  PLT 208 233   CBG:  Lab 02/14/12 1126 02/14/12 0552 02/13/12 2039 02/13/12 1604 02/13/12 1054  GLUCAP 94 88 152* 165* 112*    Recent Results (from the past 240 hour(s))  GRAM STAIN     Status: Normal   Collection Time   02/11/12 11:33 PM      Component Value Range Status Comment   Specimen Description CSF   Final    Special Requests NO 4 8CC   Final    Gram Stain     Final    Value: NO ORGANISMS SEEN FEW WBC PRESENT, PREDOMINANTLY MONONUCLEAR CYTOSPUN CALLED RESULTS TO A.MOORE RN 0040 02/12/2012 E.GADDY   Report Status 02/12/2012  FINAL   Final   CSF CULTURE     Status: Normal (Preliminary result)   Collection Time   02/11/12 11:33 PM      Component Value Range Status Comment   Specimen Description CSF   Final    Special Requests NO 4 8CC   Final    Gram Stain     Final    Value: WBC PRESENT, PREDOMINANTLY MONONUCLEAR     NO ORGANISMS SEEN     CYTOSPIN SLIDE Gram Stain Report Called to,Read Back By and Verified With: Gram Stain Report Called to,Read Back By and Verified With: A MOORE RN 0040 02/12/2012 BY E GADDY   Culture NO GROWTH 2 DAYS   Final    Report Status PENDING   Incomplete   FUNGUS CULTURE W SMEAR     Status: Normal (Preliminary result)   Collection Time   02/11/12 11:33 PM      Component Value Range Status Comment   Specimen Description CSF   Final    Special Requests NO 4 8CC   Final    Fungal Smear NO  YEAST OR FUNGAL ELEMENTS SEEN   Final    Culture CULTURE IN PROGRESS FOR FOUR WEEKS   Final    Report Status PENDING   Incomplete      Studies: No results found.  Scheduled Meds:    . citalopram  40 mg Oral QHS  . docusate sodium  200 mg Oral BID  . gabapentin  200 mg Oral TID  . heparin subcutaneous  5,000 Units Subcutaneous Q8H  . insulin aspart  0-9 Units Subcutaneous TID WC  . lactulose  30 g Oral BID  . LORazepam  1 mg Intravenous Once  . methylPREDNISolone (SOLU-MEDROL) injection  1,000 mg Intravenous Daily  . nicotine  21 mg Transdermal Daily  . pantoprazole  40 mg Oral Q1200  . polyethylene glycol  17 g Oral BID  . sodium chloride  3 mL Intravenous Q12H   Continuous Infusions:    . sodium chloride 10 mL/hr (02/13/12 1339)    Principal Problem:  *Lower extremity numbness Active Problems:  Tobacco abuse    Time spent: >30 minutes    Amy Belloso  [facility] Pager [phone]. If 8PM-8AM, please contact night-coverage at [email or web address], password [facility] 02/14/2012, 3:09 PM  LOS: 3 days

## 2012-02-15 LAB — CSF CULTURE W GRAM STAIN: Culture: NO GROWTH

## 2012-02-15 LAB — GLUCOSE, CAPILLARY: Glucose-Capillary: 90 mg/dL (ref 70–99)

## 2012-02-15 LAB — ANGIOTENSIN CONVERTING ENZYME: Angiotensin-Converting Enzyme: 28 U/L (ref 8–52)

## 2012-02-15 MED ORDER — NICOTINE 21 MG/24HR TD PT24: 1.0000 | MEDICATED_PATCH | Freq: Every day | TRANSDERMAL | Status: DC

## 2012-02-15 MED ORDER — GABAPENTIN 400 MG PO CAPS: 400.0000 mg | ORAL_CAPSULE | Freq: Three times a day (TID) | ORAL | Status: DC

## 2012-02-15 MED ORDER — HYDROCODONE-ACETAMINOPHEN 5-500 MG PO TABS: 2.0000 | ORAL_TABLET | Freq: Four times a day (QID) | ORAL | Status: DC | PRN

## 2012-02-15 MED ORDER — POLYETHYLENE GLYCOL 3350 17 G PO PACK: 17.0000 g | PACK | Freq: Two times a day (BID) | ORAL | Status: DC

## 2012-02-15 MED ORDER — PANTOPRAZOLE SODIUM 40 MG PO TBEC: 40.0000 mg | DELAYED_RELEASE_TABLET | Freq: Every day | ORAL | Status: DC

## 2012-02-15 MED ORDER — PREDNISONE 10 MG PO TABS: ORAL_TABLET | ORAL | Status: DC

## 2012-02-15 MED ORDER — HYDRALAZINE HCL 20 MG/ML IJ SOLN
10.0000 mg | Freq: Once | INTRAMUSCULAR | Status: AC
  Administered 2012-02-15: 10 mg via INTRAVENOUS
  Filled 2012-02-15: qty 0.5

## 2012-02-15 NOTE — Progress Notes (Signed)
Went over all discharge info including follow up appts, home meds, and when to call MD. Marisa Cyphers RN

## 2012-02-15 NOTE — Discharge Summary (Signed)
Physician Discharge Summary  Patricia Jensen OZH:[id number] DOB: [birth year] DOA: 02/11/2012  PCP: Default, Provider, MD  Admit date: 02/11/2012 Discharge date: 02/15/2012  Time spent: >30 minutes  Recommendations for Outpatient Follow-up:  Follow oligoclonal bands, ACE level and IgG index but in followup appointment. These results are pending at the moment of discharge. [facility] with [facility] for further evaluation and treatment on 03/17/2012.  Discharge Diagnoses:  Lower extremity numbness/weakness (high concerns for MS, no confirmed at this moment) Tobacco abuse Constipation GERD Depression/anxiety  Discharge Condition: stable and improved. Patient will be discharged home appointment has been made for 03/17/2012 with [facility]. She has been instructed to follow medications and discharge instructions as prescribed. Vicodin as needed and a schedule Neurontin has been provided for pain control.  Filed Weights   02/13/12 0517 02/14/12 0554 02/15/12 0528  Weight: 98.6 kg (217 lb 6 oz) 98.703 kg (217 lb 9.6 oz) 99.3 kg (218 lb 14.7 oz)    History of present illness:  32 year-old female with past medical history of preeclampsia presents with complains of persistent tingling and numbness of the lower extremity which extends up to her low back. Her symptoms started almost 10 days ago had initially come to the ER had lumbosacral spine MRI which did not show any acute. She was further referred to the ER because of the symptoms were persistent and at this time neurologist had evaluated the patient and MRA of the brain C-spine and T-spine were done. MRI shows concerns for multiple sclerosis. Patient is about to get a lumbar puncture done. Patient has been admitted for further management. Patient states in addition to her numbness in the lower extremity patient also has incontinence of urine but denies any incontinence of bowel.    Hospital  Course:  1-LE weakness/numbness: high concerns for MS.  -patient received 5 doses of IV Solu-Medrol (1000 mg daily); and has not been discharge with instructions for prednisone taper 10 pack. She will follow with neurology as an outpatient for further evaluation and treatment. -Pain/numbness at this point stable and controlled. Will continue the use of Neurontin and when necessary Vicodin. -at discharge is still pending results from LP (still pending olig bands, IgG index and CSF ACE). Will be follow by neurology and discuss with patient but in followup appointment. -Lyme titers negative   2-constipation: resolved. Most likely secondary to narcotic medications use. Patient will be discharged a bowel regimen.  3-GI prophylaxis: continue PPI   4-Tobacco abuse: counseling provided. Continue nicotine patch.   5-Depression/anxiety: continue celexa and ativan   Procedures: Lumbar puncture:essentially no abnormalities so far seen. olig bands, IgG index pending at discharge. Will be followed by neurology but in followup appointment.  Consultations:  neurology  Discharge Exam: Filed Vitals:   02/15/12 0701 02/15/12 0850 02/15/12 0852 02/15/12 0956  BP: 162/106 159/106 155/105 137/88  Pulse: 67  62 68  Temp:      TempSrc:      Resp:  18    Height:      Weight:      SpO2:  99%      General: NAD, still complaining of back pain and decrease sensation on her legs, no CP or SOB  Cardiovascular: S1 and S2, no rubs, no murmurs, no gallops  Respiratory: CTA bilaterally  Abdomen: soft, NT, ND, positive BS  Extremities: no edema  Neuro: decrease MS LE bilaterally 4/5; also decrease sensation to light touch; per patient waist down feeling numbed.  Discharge Instructions  Discharge Orders    Future Appointments: Provider: Department: Dept Phone: Center:   02/25/2012 10:45 AM Purcell Nails, MD [facility] [phone] None     Future Orders Please Complete By  Expires   Increase activity slowly      Discharge instructions      Comments:   -Keep herself well hydrated. -Take medications as prescribed. -Be compliant with followup at the neurologist office as instructed. -arrange followup with PCP over the next 2 weeks.       Medication List     As of 02/15/2012  1:32 PM    STOP taking these medications         ibuprofen 200 MG tablet   Commonly known as: ADVIL,MOTRIN      TAKE these medications         citalopram 40 MG tablet   Commonly known as: CELEXA   Take 40 mg by mouth at bedtime.      gabapentin 400 MG capsule   Commonly known as: NEURONTIN   Take 1 capsule (400 mg total) by mouth 3 (three) times daily.      HYDROcodone-acetaminophen 5-500 MG per tablet   Commonly known as: VICODIN   Take 2 tablets by mouth every 6 (six) hours as needed for pain.      LORazepam 2 MG tablet   Commonly known as: ATIVAN   Take 2 mg by mouth 2 (two) times daily as needed. For anxiety      nicotine 21 mg/24hr patch   Commonly known as: NICODERM CQ - dosed in mg/24 hours   Place 1 patch onto the skin daily.      pantoprazole 40 MG tablet   Commonly known as: PROTONIX   Take 1 tablet (40 mg total) by mouth daily at 12 noon.      polyethylene glycol packet   Commonly known as: MIRALAX / GLYCOLAX   Take 17 g by mouth 2 (two) times daily.      predniSONE 10 MG tablet   Commonly known as: DELTASONE   Take 6 tablets by mouth daily times one day; then take 5 tablets by mouth daily x2 days; then take 4 tablets by mouth daily times one day; then take 3 tablets by mouth daily x2 days; then take 2 tablets by mouth daily x1 day; then take 1 tablet by mouth daily x 3 days and stop prednisone.           Follow-up Information    Follow up with Default, Provider, MD. On 03/17/2012. (at 11:00 am)    Contact information:   [address] [place] [address] [phone]       Follow up with [facility]. On 02/15/2012.    Contact information:   [address] [place] [address] [phone]          The results of significant diagnostics from this hospitalization (including imaging, microbiology, ancillary and laboratory) are listed below for reference.    Significant Diagnostic Studies: Mr Laqueta Jean MW Contrast  03/12/12  *RADIOLOGY REPORT*  Clinical Data: Abrupt onset of numbness 01/29/2012.  Numbness has become worse ascending with development of bladder incontinence.  MRI HEAD WITHOUT AND WITH CONTRAST  Technique:  Multiplanar, multiecho pulse sequences of the brain and surrounding structures were obtained according to standard protocol without and with intravenous contrast  Contrast: 20mL MULTIHANCE GADOBENATE DIMEGLUMINE 529 MG/ML IV SOLN  Comparison: No comparison brain  MR.  Findings: No acute infarct.  No intracranial hemorrhage.  Several scattered nonspecific white matter type changes most notable periventricular region.  In a patient of this age and gender and presenting symptoms, findings are suspicious for demyelinating process such as multiple sclerosis.  None of these areas enhance or demonstrate restricted motion as can be seen with areas of active demyelination.  Other causes of white matter type changes not entirely although felt less likely include that secondary to; vasculitis, inflammatory process, small vessel disease, prior trauma or migraine headaches.  Decreased signal intensity bone marrow may be related to patient's habitus.  Correlation with CBC to exclude anemia contributing to this finding may be considered.  Questionable 2 mm lesion within the pars intermedia of the pituitary gland possibly a tiny Rathke cleft cyst (series 29 image 10).  Attention to this when the patient has follow-up imaging for white matter findings.  Otherwise no evidence of intracranial mass lesion or abnormal enhancement.  Major intracranial vascular structures are patent.  Cervical medullary  junction, pineal region and orbital structures unremarkable.  IMPRESSION: Findings raise possibility of multiple sclerosis as detailed above.   Original Report Authenticated By: Lacy Duverney, M.D.    Mr Lumbar Spine Wo Contrast  02/02/2012  *RADIOLOGY REPORT*  Clinical Data: 32 year old female with loss of sensation from waist down.  Perineum numbness.  Acute onset with no known injury.  MRI LUMBAR SPINE WITHOUT CONTRAST  Technique:  Multiplanar and multiecho pulse sequences of the lumbar spine were obtained without intravenous contrast.  Comparison: None.  Findings: Lumbar segmentation appears to be normal.  Vertebral height and alignment within normal limits.  Degenerative endplate changes in the lower lumbar spine, but No marrow edema or evidence of acute osseous abnormality.   Visualized lower thoracic spinal cord is normal with conus medularis at L1-L2.  Cauda equina nerve roots appear within normal limits.  Visualized paraspinal soft tissues are within normal limits. Negative visualized abdominal viscera except for intrinsic T1 hyperintensity in the gallbladder.  Query milk of calcium bile or sludge.  T10-T11:  Grossly negative.  T11-T12: Negative.  T12-L1:  Negative  L1-L2:  Negative  L2-L3:  Negative.  L3-L4:  Negative.  L4-L5:  Disc desiccation.  Circumferential disc bulge with superimposed small to moderate right paracentral and slightly caudal disc protrusion with annular tear.  Effacement the lateral recesses, more so the right.  Overall mild spinal stenosis.  No foraminal stenosis.  Trace fluid in both facet joints.  L5-S1:  Disc desiccation.  Small central disc protrusion with annular tear.  This is in proximity to the S1 nerve roots but there is no nerve root displacement.  No spinal stenosis.  No foraminal involvement.  IMPRESSION: 1.  L4-L5 and L5-S1 disc degeneration.  Mild spinal and right lateral recess stenosis at the former. 2.  Otherwise negative lumbar spine. 3.  Evidence of complex fluid  in the gallbladder, perhaps milk of calcium bile or sludge.   Original Report Authenticated By: Erskine Speed, M.D.    Mr Cervical Spine W Wo Contrast  02/11/2012  *RADIOLOGY REPORT*  Clinical Data: Numbness.  Question multiple sclerosis.  MRI CERVICAL SPINE WITHOUT AND WITH CONTRAST  Technique:  Multiplanar and multiecho pulse sequences of the cervical spine, to include the craniocervical junction and cervicothoracic junction, were obtained according to standard protocol without and with intravenous contrast.  Contrast: 20mL MULTIHANCE GADOBENATE DIMEGLUMINE 529 MG/ML IV SOLN  Comparison: MR brain performed the same date.  Findings: Questionable nodularity of  the thyroid gland incompletely assessed.  This can be evaluated with elective thyroid ultrasound.  Cervical medullary junction unremarkable.  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  Series 13 image 6 raises possibility of altered signal intensity within the cervical cord at the C4-5 and C5 level.  This is difficult to confirm as a true finding on the other sequences and may be related to artifact rather than a demyelinating plaque.  C2-3:  Negative.  C3-4:  Minimal bulge.  C4-5:  Mild bulge.  C5-6:  Small central disc protrusion with cord contact and minimal deformity.  C6-7:  Minimal bulge.  C7-T1:  Negative.  IMPRESSION: Series 13 image 6 raises possibility of altered signal intensity within the cervical cord at the C4-5 and C5 level.  This is difficult to confirm as a true finding on the other sequences and may be related to artifact rather than a demyelinating plaque.  C5-6 small central disc protrusion with cord contact and minimal deformity.  Please see above.   Original Report Authenticated By: Lacy Duverney, M.D.    Mr Thoracic Spine W Wo Contrast  02/11/2012  *RADIOLOGY REPORT*  Clinical Data: Numbness.  Rule out multiple sclerosis.  MRI THORACIC SPINE WITHOUT AND WITH CONTRAST  Technique:  Multiplanar and multiecho pulse sequences of the thoracic  spine were obtained without and with intravenous contrast.  Contrast: 20mL MULTIHANCE GADOBENATE DIMEGLUMINE 529 MG/ML IV SOLN  Comparison: MR brain and cervical spine same date. Lumbar spine MR 02/02/2012.  Findings: Altered signal intensity within the cord T8-9 to mid T9 level and T10 level suggestive of a demyelinating process such as multiple sclerosis.  Other causes of cord signal abnormality such as that secondary to inflammatory process (such as sarcoidosis) or infectious process (such as Lymes disease) felt to be less likely considerations. Minimal linear enhancement along the dorsal aspect of the T8-T9 lesion may represent a vessel rather than enhancement of the cord signal abnormality.  Nodularity right lung (series 23 image 13 and series 27 image 14) may represent artifact although a true nodule is not entirely excluded.  T2-3:  Tiny left paracentral protrusion.  T3-4:  Small broad-based right post lateral protrusion with minimal right sided cord flattening.  IMPRESSION: Altered signal intensity within the cord T8-9 to mid T9 level and T10 level raises possibility of a demyelinating process such as multiple sclerosis in a patient of this age and gender, presenting symptoms and brain MR findings. Clinical and laboratory correlation recommended.  T2-3 tiny left paracentral protrusion.  T3-4 small broad-based right post lateral protrusion with minimal right sided cord flattening.  9 mm nodularity right lung may represent an artifact.   True nodule not entirely excluded.   Original Report Authenticated By: Lacy Duverney, M.D.     Microbiology: Recent Results (from the past 240 hour(s))  GRAM STAIN     Status: Normal   Collection Time   02/11/12 11:33 PM      Component Value Range Status Comment   Specimen Description CSF   Final    Special Requests NO 4 8CC   Final    Gram Stain     Final    Value: NO ORGANISMS SEEN FEW WBC PRESENT, PREDOMINANTLY MONONUCLEAR CYTOSPUN CALLED RESULTS TO A.MOORE RN  0040 02/12/2012 E.GADDY   Report Status 02/12/2012 FINAL   Final   CSF CULTURE     Status: Normal   Collection Time   02/11/12 11:33 PM      Component Value Range Status Comment  Specimen Description CSF   Final    Special Requests NO 4 8CC   Final    Gram Stain     Final    Value: WBC PRESENT, PREDOMINANTLY MONONUCLEAR     NO ORGANISMS SEEN     CYTOSPIN SLIDE Gram Stain Report Called to,Read Back By and Verified With: Gram Stain Report Called to,Read Back By and Verified With: A MOORE RN 0040 02/12/2012 BY E GADDY   Culture NO GROWTH 3 DAYS   Final    Report Status 02/15/2012 FINAL   Final   FUNGUS CULTURE W SMEAR     Status: Normal (Preliminary result)   Collection Time   02/11/12 11:33 PM      Component Value Range Status Comment   Specimen Description CSF   Final    Special Requests NO 4 8CC   Final    Fungal Smear NO YEAST OR FUNGAL ELEMENTS SEEN   Final    Culture CULTURE IN PROGRESS FOR FOUR WEEKS   Final    Report Status PENDING   Incomplete      Labs: Basic Metabolic Panel:  Lab 02/13/12 5284 02/12/12 0540 02/11/12 1701  NA 141 136 135  K 4.2 3.9 3.5  CL 106 100 99  CO2 26 23 26   GLUCOSE 125* 133* 83  BUN 10 14 12   CREATININE 0.78 0.83 0.85  CALCIUM 9.2 9.1 9.6  MG -- -- --  PHOS -- -- --   Liver Function Tests:  Lab 02/11/12 1701  AST 18  ALT 7  ALKPHOS 42  BILITOT 0.3  PROT 7.9  ALBUMIN 4.2   CBC:  Lab 02/12/12 0540 02/11/12 1701  WBC 6.2 8.3  NEUTROABS -- 4.5  HGB 13.0 13.2  HCT 38.3 38.1  MCV 94.8 94.8  PLT 208 233   CBG:  Lab 02/15/12 1052 02/15/12 0548 02/14/12 2131 02/14/12 1613 02/14/12 1126  GLUCAP 124* 90 125* 158* 94     Signed:  Tinsleigh Slovacek  [facility] 02/15/2012, 1:32 PM

## 2012-02-15 NOTE — Progress Notes (Signed)
Pt discharged wanted to ambulate - has all personal belongings and prescriptions. Accompanied by nurse tech and Lovey Newcomer RN

## 2012-02-15 NOTE — Progress Notes (Signed)
NEURO HOSPITALIST PROGRESS NOTE   SUBJECTIVE:                                                                                                                        No further complaints.  Patient in good spirits.   OBJECTIVE:                                                                                                                           Vital signs in last 24 hours: Temp:  [98.1 F (36.7 C)-98.8 F (37.1 C)] 98.5 F (36.9 C) (01/24 0528) Pulse Rate:  [57-87] 67  (01/24 0701) Resp:  [18-20] 18  (01/24 0850) BP: (135-176)/(81-110) 155/105 mmHg (01/24 0852) SpO2:  [99 %-100 %] 99 % (01/24 0850) Weight:  [99.3 kg (218 lb 14.7 oz)] 99.3 kg (218 lb 14.7 oz) (01/24 0528)  Intake/Output from previous day: 01/23 0701 - 01/24 0700 In: 960 [P.O.:960] Out: 800 [Urine:800] Intake/Output this shift: Total I/O In: 240 [P.O.:240] Out: -  Nutritional status: General  Past Medical History  Diagnosis Date  . Pregnancy induced hypertension   . Anxiety     Neurologic ROS negative with exception of above. Musculoskeletal ROS none  Neurologic Exam:   Mental Status: Alert, oriented, thought content appropriate.  Speech fluent without evidence of aphasia.  Able to follow 3 step commands without difficulty. Cranial Nerves: II: Visual fields grossly normal, pupils equal, round, reactive to light and accommodation III,IV, VI: ptosis not present, extra-ocular motions intact bilaterally V,VII: smile symmetric, facial light touch sensation normal bilaterally VIII: hearing normal bilaterally IX,X: gag reflex present XI: bilateral shoulder shrug XII: midline tongue extension Motor: Right : Upper extremity   5/5    Left:     Upper extremity   5/5  Lower extremity   5/5     Lower extremity   5/5 Tone and bulk:normal tone throughout; no atrophy noted Sensory: Decreased light touch sensation in left leg from calf to foot Deep Tendon Reflexes: 2+ and  symmetric throughout Plantars: Right: downgoing   Left: downgoing Cerebellar: normal finger-to-nose,  normal heel-to-shin test CV: pulses palpable throughout    Lab Results: No results found for this basename: cbc, bmp, coags, chol, tri, ldl, hga1c  Lipid Panel No results found for this basename: CHOL,TRIG,HDL,CHOLHDL,VLDL,LDLCALC in the last 72 hours  Studies/Results: No results found.  MEDICATIONS                                                                                                                        Scheduled:   . citalopram  40 mg Oral QHS  . docusate sodium  200 mg Oral BID  . gabapentin  400 mg Oral TID  . heparin subcutaneous  5,000 Units Subcutaneous Q8H  . insulin aspart  0-9 Units Subcutaneous TID WC  . lactulose  30 g Oral BID  . LORazepam  1 mg Intravenous Once  . methylPREDNISolone (SOLU-MEDROL) injection  1,000 mg Intravenous Daily  . nicotine  21 mg Transdermal Daily  . pantoprazole  40 mg Oral Q1200  . polyethylene glycol  17 g Oral BID  . sodium chloride  3 mL Intravenous Q12H    ASSESSMENT/PLAN:                                                                                                               Patient Active Hospital Problem List: Lower extremity numbness (02/11/2012)   Assessment: 32 YO female with lesions in T spine and brain concerning for MS. olig bands, IgG index, CSF ACE pending.     Plan:  1) Recommend Medrol dose pack on discharge--this has been discussed with Dr. Gwenlyn Perking.  2) Dr. Appointment made for [facility] February 24 11 AM-patient to be there at 10:30. With Dr. Terrace Arabia.--This has be explained to patient.   Neurology will S/O     Felicie Morn PA-C [facility] [phone]  02/15/2012, 9:00 AM

## 2012-02-25 ENCOUNTER — Encounter: Payer: Self-pay | Admitting: Obstetrics and Gynecology

## 2012-02-26 LAB — HEAVY METALS SCREEN, URINE

## 2012-03-08 ENCOUNTER — Other Ambulatory Visit: Payer: Self-pay

## 2012-03-09 LAB — FUNGUS CULTURE W SMEAR

## 2012-05-05 ENCOUNTER — Ambulatory Visit: Payer: Self-pay | Admitting: Neurology

## 2012-05-08 ENCOUNTER — Ambulatory Visit: Payer: Medicaid Other | Admitting: Physical Therapy

## 2012-05-12 ENCOUNTER — Ambulatory Visit (INDEPENDENT_AMBULATORY_CARE_PROVIDER_SITE_OTHER): Payer: Medicaid Other | Admitting: Neurology

## 2012-05-12 ENCOUNTER — Encounter: Payer: Self-pay | Admitting: Neurology

## 2012-05-12 VITALS — BP 131/91 | HR 85 | Ht 65.0 in | Wt 227.0 lb

## 2012-05-12 DIAGNOSIS — F411 Generalized anxiety disorder: Secondary | ICD-10-CM

## 2012-05-12 DIAGNOSIS — O139 Gestational [pregnancy-induced] hypertension without significant proteinuria, unspecified trimester: Secondary | ICD-10-CM | POA: Insufficient documentation

## 2012-05-12 DIAGNOSIS — F419 Anxiety disorder, unspecified: Secondary | ICD-10-CM

## 2012-05-12 DIAGNOSIS — G35 Multiple sclerosis: Secondary | ICD-10-CM

## 2012-05-12 MED ORDER — NAPROXEN 500 MG PO TABS: 500.0000 mg | ORAL_TABLET | Freq: Two times a day (BID) | ORAL | Status: DC

## 2012-05-12 MED ORDER — GABAPENTIN 600 MG PO TABS: 600.0000 mg | ORAL_TABLET | Freq: Three times a day (TID) | ORAL | Status: DC

## 2012-05-12 MED ORDER — GABAPENTIN 400 MG PO CAPS: 400.0000 mg | ORAL_CAPSULE | Freq: Three times a day (TID) | ORAL | Status: DC

## 2012-05-12 NOTE — Progress Notes (Signed)
HPI: 32 year-old female was admitted to hosptial in Feb 12 2012,  with complains of persistent tingling and numbness of the lower extremity which extends up to her low back. Her symptoms started in Jan 10th, she woke up with numbness from waist down,  Numbness also involving peroneal region, she also has mild constipation, urinary incontinence, she has received IV Solu-Medrol for 3 days, followed by by mouth his prednisone tapering, her bilateral lower extremity paresthesia has much improved, limited to bilateral feet now, she deny weakness,  She also has a history of keratoconus, was able to wear her soft contact in her right eye, with progressive cone shaped cornea on the left side, she can only count finger on her left side, there was no family history of keratoconus    Laboratory evaluation, ACE 28, TSH was mildly elevated 4 point 7, normal free T4, CBC BMP with exception of mildly elevated glucose 120, Antibody to Borrelia burgdorferi not detected., ESR was 9    CSF, culture was negative, glucose 53, total protein 21, WBC 7, RBC 1 587, likely traumatic tap, VDRL nonreactive, more than 5 well defined oligoclonal band, IgG index normal at 2 point 4, myelin basic protein 2.4    MRI of thoracic spine,Altered signal intensity within the cord T8-9 to mid T9 level and T10 level MRI cervical:  altered signal intensity within the cervical cord at the C4-5 and C5 level MRI of brain:Several scattered nonspecific white matter type changes most notable periventricular region.  In a patient of this age and gender and presenting symptoms, findings are suspicious for demyelinating process such as multiple sclerosis.  None of these areas enhance MRI of the lumbar: L4-L5 and L5-S1 disc degeneration.  Mild spinal and right lateral recess stenosis at the former.  Initially she was considering for PREFER MS research trial, but she was not compliant with her appointments, she came in today, complains of low back  pain, also wants further discussion about medication treatment for multiple sclerosis, she does not want to needle injection,   Review of Systems  Out of a complete 14 system review, the patient complains of only the following symptoms, and all other reviewed systems are negative.   Constitutional:   fatigue Cardiovascular:  N/A Ear/Nose/Throat:  N/A Skin: N/A Eyes: N/A Respiratory: N/A Gastroitestinal: N/A    Hematology/Lymphatic:  N/A Endocrine:  Feeling cold Musculoskeletal: joint pain, cramps Allergy/Immunology: N/A Neurological: memory loss, headaches, numbness, weakness, insomnia Psychiatric:    N/A   Physical Exam  Neck: supple no carotid bruits Respiratory: clear to auscultation bilaterally Cardiovascular: regular rate rhythm  Neurologic Exam  Mental Status: pleasant, awake, alert, cooperative to history, talking, and casual conversation. Cranial Nerves: CN II-XII pupils were equal round reactive to light.  Fundi were sharp bilaterally.  Extraocular movements were full.  Visual fields were full on confrontational test.  Facial sensation and strength were normal.  Hearing was intact to finger rubbing bilaterally.  Uvula tongue were midline.  Head turning and shoulder shrugging were normal and symmetric.  Tongue protrusion into the cheeks strength were normal.  Motor: Normal tone, bulk, and strength. Sensory: Normal to light touch, pinprick, proprioception, and  decreased vibratory sensation at toes. Coordination: Normal finger-to-nose, heel-to-shin.  There was no dysmetria noticed. Gait and Station: Narrow based and steady, was able to perform tiptoe, heel, and tandem walking without difficulty.  Romberg sign: Negative Reflexes: Deep tendon reflexes: Biceps: 3/3, Brachioradialis: 3/3, Triceps:3/3, Pateller: 3/3, Achilles: 2/2.  Plantar responses are flexor.  Assessment and Plan: 32 years old [place] American female, with past medical history of keratoconus, presenting with  thoracic, brain, possible cervical lesions, with 5 oligoclonal banding, consistent with relapsed intermediate multiple sclerosis  1, I have discussed her was treatment option, options were provided, 2. Decided to proceed with Tecfidera.

## 2012-05-19 ENCOUNTER — Ambulatory Visit: Payer: Medicaid Other | Attending: Internal Medicine | Admitting: Rehabilitation

## 2012-05-22 ENCOUNTER — Telehealth: Payer: Self-pay

## 2012-05-22 NOTE — Telephone Encounter (Signed)
Sasha from [facility] called to advise [place] Tecfidera is not covered under the patient's insurance plan and will require a prior auth.  I will initiate prior auth.  Pending response.

## 2012-05-23 ENCOUNTER — Telehealth: Payer: Self-pay

## 2012-05-23 NOTE — Telephone Encounter (Signed)
Patient called and left message wanting to check on the status of Tecfidera.  I called back.  Got no answer.  Left message PA is pending response from ins, which may take a few days.

## 2012-08-11 ENCOUNTER — Ambulatory Visit: Payer: Medicaid Other | Admitting: Neurology

## 2012-10-08 ENCOUNTER — Ambulatory Visit (INDEPENDENT_AMBULATORY_CARE_PROVIDER_SITE_OTHER): Payer: Medicaid Other | Admitting: Neurology

## 2012-10-08 ENCOUNTER — Encounter: Payer: Self-pay | Admitting: Neurology

## 2012-10-08 VITALS — BP 141/96 | HR 82 | Ht 65.0 in | Wt 228.0 lb

## 2012-10-08 DIAGNOSIS — G35 Multiple sclerosis: Secondary | ICD-10-CM

## 2012-10-08 DIAGNOSIS — F411 Generalized anxiety disorder: Secondary | ICD-10-CM

## 2012-10-08 DIAGNOSIS — F419 Anxiety disorder, unspecified: Secondary | ICD-10-CM

## 2012-10-08 DIAGNOSIS — F329 Major depressive disorder, single episode, unspecified: Secondary | ICD-10-CM

## 2012-10-08 DIAGNOSIS — F32A Depression, unspecified: Secondary | ICD-10-CM

## 2012-10-08 MED ORDER — MELOXICAM 7.5 MG PO TABS: 7.5000 mg | ORAL_TABLET | Freq: Every day | ORAL | Status: DC

## 2012-10-08 MED ORDER — ZOLPIDEM TARTRATE 5 MG PO TABS: 5.0000 mg | ORAL_TABLET | Freq: Every evening | ORAL | Status: DC | PRN

## 2012-10-08 MED ORDER — GABAPENTIN 600 MG PO TABS: 600.0000 mg | ORAL_TABLET | Freq: Three times a day (TID) | ORAL | Status: DC

## 2012-10-08 NOTE — Progress Notes (Signed)
HPI: 32 year-old female was admitted to hosptial in Feb 12 2012,  with complains of persistent tingling and numbness of the lower extremity which extends up to her low back. Her symptoms started in Jan 10th, she woke up with numbness from waist down, numbness also involving perineal region, she also has mild constipation, urinary incontinence, she  received IV Solu-Medrol for 3 days, followed by by prednisone tapering, her bilateral lower extremity paresthesia has much improved, limited to bilateral feet now, she deny weakness,  She also has a history of keratoconus, was able to wear her soft contact in her right eye, with progressive cone shaped cornea on the left side, she can only count finger on her left side, there was no family history of keratoconus    Laboratory evaluation, ACE 28, TSH was mildly elevated 4.7, normal free T4, CBC BMP with exception of mildly elevated glucose 120, Antibody to Borrelia burgdorferi not detected, ESR was 9    CSF: culture was negative, glucose 53, total protein 21, WBC 7, RBC 1 587, likely traumatic tap, VDRL nonreactive, more than 5 well defined oligoclonal band, IgG index normal at 2 point 4, myelin basic protein 2.4    MRI of thoracic spine,Altered signal intensity within the cord T8-9 to mid T9 level and T10 level MRI cervical:  altered signal intensity within the cervical cord at the C4-5 and C5 level MRI of brain:Several scattered nonspecific white matter type changes most notable periventricular region.  In a patient of this age and gender and presenting symptoms, findings are suspicious for demyelinating process such as multiple sclerosis.  None of these areas enhance MRI of the lumbar: L4-L5 and L5-S1 disc degeneration.  Mild spinal and right lateral recess stenosis at the former.  UPDATE Sep 17th 2014: She was started on Tecfidera since last visit in April 2014, there was no significant side effect, she has developed a right wrist ganglion cyst, mildly  tender.  Review of Systems  Out of a complete 14 system review, the patient complains of only the following symptoms, and all other reviewed systems are negative.   Constitutional:   fatigue Cardiovascular:  N/A Ear/Nose/Throat:  N/A Skin: N/A Eyes: N/A Respiratory: N/A Gastroitestinal: N/A    Hematology/Lymphatic:  N/A Endocrine:  Feeling cold Musculoskeletal: joint pain, cramps Allergy/Immunology: N/A Neurological: memory loss, headaches, numbness, weakness, insomnia Psychiatric:    N/A  Physical Exam  Neck: supple no carotid bruits Respiratory: clear to auscultation bilaterally Cardiovascular: regular rate rhythm  Neurologic Exam  Mental Status: pleasant, awake, alert, cooperative to history, talking, and casual conversation. Cranial Nerves: CN II-XII pupils were equal round reactive to light.  Fundi were sharp bilaterally.  Extraocular movements were full.  Visual fields were full on confrontational test.  Facial sensation and strength were normal.  Hearing was intact to finger rubbing bilaterally.  Uvula tongue were midline.  Head turning and shoulder shrugging were normal and symmetric.  Tongue protrusion into the cheeks strength were normal.  Motor: Normal tone, bulk, and strength. Right wrist ganglion cyst 2x3cm, mobile, mild right thumb fixed flexion. Sensory: Normal to light touch, pinprick, proprioception, and  decreased vibratory sensation at toes. Coordination: Normal finger-to-nose, heel-to-shin.  There was no dysmetria noticed. Gait and Station: Narrow based and steady, was able to perform tiptoe, heel, and tandem walking without difficulty.  Romberg sign: Negative Reflexes: Deep tendon reflexes: Biceps: 3/3, Brachioradialis: 3/3, Triceps:3/3, Pateller: 3/3, Achilles: 2/2.  Plantar responses are flexor.  Assessment and Plan: 32 years old African  American female, with past medical history of keratoconus, presenting with thoracic, brain, possible cervical lesions, with  5 oligoclonal banding, consistent with relapsed intermediate multiple sclerosis  1, she is tolerating Tecfidera. 2. She has right wrist ganglion cyst, will refer her to hand surgeon.

## 2012-10-31 ENCOUNTER — Ambulatory Visit
Admission: RE | Admit: 2012-10-31 | Discharge: 2012-10-31 | Disposition: A | Discharge status: Home / Self Care | Payer: Medicaid Other | Source: Ambulatory Visit | Attending: Neurology | Admitting: Neurology

## 2012-10-31 DIAGNOSIS — G35 Multiple sclerosis: Secondary | ICD-10-CM

## 2012-10-31 DIAGNOSIS — F32A Depression, unspecified: Secondary | ICD-10-CM

## 2012-10-31 DIAGNOSIS — F329 Major depressive disorder, single episode, unspecified: Secondary | ICD-10-CM

## 2012-10-31 DIAGNOSIS — F419 Anxiety disorder, unspecified: Secondary | ICD-10-CM

## 2012-10-31 MED ORDER — GADOBENATE DIMEGLUMINE 529 MG/ML IV SOLN
20.0000 mL | Freq: Once | INTRAVENOUS | Status: AC | PRN
  Administered 2012-10-31: 20 mL via INTRAVENOUS

## 2012-11-03 NOTE — Progress Notes (Signed)
Quick Note:  Call pt about her MRI results and pt verbalized understanding. ______

## 2012-11-03 NOTE — Progress Notes (Signed)
Quick Note:  Please call patient, MRI findings are consistent with MS, no significant changes, compared to previous scan, she should continue current treatment ______

## 2012-11-27 ENCOUNTER — Other Ambulatory Visit: Payer: Self-pay

## 2013-01-28 ENCOUNTER — Emergency Department (HOSPITAL_COMMUNITY)
Admission: EM | Admit: 2013-01-28 | Discharge: 2013-01-28 | Disposition: A | Discharge status: Home / Self Care | Payer: Medicaid Other | Attending: Emergency Medicine | Admitting: Emergency Medicine

## 2013-01-28 ENCOUNTER — Emergency Department (HOSPITAL_COMMUNITY): Payer: Medicaid Other

## 2013-01-28 ENCOUNTER — Encounter (HOSPITAL_COMMUNITY): Payer: Self-pay | Admitting: Emergency Medicine

## 2013-01-28 DIAGNOSIS — F411 Generalized anxiety disorder: Secondary | ICD-10-CM | POA: Insufficient documentation

## 2013-01-28 DIAGNOSIS — IMO0001 Reserved for inherently not codable concepts without codable children: Secondary | ICD-10-CM | POA: Insufficient documentation

## 2013-01-28 DIAGNOSIS — R5381 Other malaise: Secondary | ICD-10-CM | POA: Insufficient documentation

## 2013-01-28 DIAGNOSIS — R5383 Other fatigue: Secondary | ICD-10-CM

## 2013-01-28 DIAGNOSIS — Z79899 Other long term (current) drug therapy: Secondary | ICD-10-CM | POA: Insufficient documentation

## 2013-01-28 DIAGNOSIS — J029 Acute pharyngitis, unspecified: Secondary | ICD-10-CM | POA: Insufficient documentation

## 2013-01-28 DIAGNOSIS — Z791 Long term (current) use of non-steroidal anti-inflammatories (NSAID): Secondary | ICD-10-CM | POA: Insufficient documentation

## 2013-01-28 DIAGNOSIS — Z8669 Personal history of other diseases of the nervous system and sense organs: Secondary | ICD-10-CM | POA: Insufficient documentation

## 2013-01-28 DIAGNOSIS — J159 Unspecified bacterial pneumonia: Secondary | ICD-10-CM | POA: Insufficient documentation

## 2013-01-28 DIAGNOSIS — Z792 Long term (current) use of antibiotics: Secondary | ICD-10-CM | POA: Insufficient documentation

## 2013-01-28 DIAGNOSIS — F17200 Nicotine dependence, unspecified, uncomplicated: Secondary | ICD-10-CM | POA: Insufficient documentation

## 2013-01-28 DIAGNOSIS — J189 Pneumonia, unspecified organism: Secondary | ICD-10-CM

## 2013-01-28 LAB — CBC WITH DIFFERENTIAL/PLATELET
BASOS ABS: 0 10*3/uL (ref 0.0–0.1)
Basophils Relative: 0 % (ref 0–1)
EOS PCT: 0 % (ref 0–5)
Eosinophils Absolute: 0 10*3/uL (ref 0.0–0.7)
HCT: 36.9 % (ref 36.0–46.0)
Hemoglobin: 12.3 g/dL (ref 12.0–15.0)
LYMPHS ABS: 1.8 10*3/uL (ref 0.7–4.0)
LYMPHS PCT: 12 % (ref 12–46)
MCH: 31.8 pg (ref 26.0–34.0)
MCHC: 33.3 g/dL (ref 30.0–36.0)
MCV: 95.3 fL (ref 78.0–100.0)
Monocytes Absolute: 1.6 10*3/uL — ABNORMAL HIGH (ref 0.1–1.0)
Monocytes Relative: 10 % (ref 3–12)
NEUTROS ABS: 12.2 10*3/uL — AB (ref 1.7–7.7)
NEUTROS PCT: 78 % — AB (ref 43–77)
PLATELETS: 208 10*3/uL (ref 150–400)
RBC: 3.87 MIL/uL (ref 3.87–5.11)
RDW: 14 % (ref 11.5–15.5)
WBC: 15.5 10*3/uL — AB (ref 4.0–10.5)

## 2013-01-28 LAB — COMPREHENSIVE METABOLIC PANEL
ALK PHOS: 51 U/L (ref 39–117)
ALT: 5 U/L (ref 0–35)
AST: 15 U/L (ref 0–37)
Albumin: 3.6 g/dL (ref 3.5–5.2)
BUN: 7 mg/dL (ref 6–23)
CALCIUM: 8.8 mg/dL (ref 8.4–10.5)
CO2: 27 meq/L (ref 19–32)
Chloride: 97 mEq/L (ref 96–112)
Creatinine, Ser: 0.82 mg/dL (ref 0.50–1.10)
GFR calc Af Amer: >90 mL/min (ref >90)
GLUCOSE: 98 mg/dL (ref 70–99)
POTASSIUM: 3.3 meq/L — AB (ref 3.7–5.3)
SODIUM: 139 meq/L (ref 137–147)
Total Bilirubin: 0.5 mg/dL (ref 0.3–1.2)
Total Protein: 7.8 g/dL (ref 6.0–8.3)

## 2013-01-28 LAB — CG4 I-STAT (LACTIC ACID): Lactic Acid, Venous: 1.02 mmol/L (ref 0.5–2.2)

## 2013-01-28 MED ORDER — IBUPROFEN 800 MG PO TABS: 800.0000 mg | ORAL_TABLET | Freq: Three times a day (TID) | ORAL | Status: DC

## 2013-01-28 MED ORDER — DIMETHYL FUMARATE 240 MG PO CPDR: 240.0000 mg | DELAYED_RELEASE_CAPSULE | Freq: Two times a day (BID) | ORAL | Status: DC

## 2013-01-28 MED ORDER — GABAPENTIN 600 MG PO TABS: 600.0000 mg | ORAL_TABLET | Freq: Every day | ORAL | Status: DC | PRN

## 2013-01-28 MED ORDER — HYDROCODONE-HOMATROPINE 5-1.5 MG/5ML PO SYRP: 5.0000 mL | ORAL_SOLUTION | Freq: Four times a day (QID) | ORAL | Status: DC | PRN

## 2013-01-28 MED ORDER — ACETAMINOPHEN 500 MG PO TABS: 500.0000 mg | ORAL_TABLET | Freq: Four times a day (QID) | ORAL | Status: DC | PRN

## 2013-01-28 MED ORDER — HYDROCODONE-HOMATROPINE 5-1.5 MG/5ML PO SYRP
5.0000 mL | ORAL_SOLUTION | Freq: Once | ORAL | Status: AC
  Administered 2013-01-28: 5 mL via ORAL
  Filled 2013-01-28: qty 5

## 2013-01-28 MED ORDER — DOXYCYCLINE HYCLATE 100 MG PO CAPS: 100.0000 mg | ORAL_CAPSULE | Freq: Two times a day (BID) | ORAL | Status: DC

## 2013-01-28 NOTE — ED Notes (Signed)
Pt is going to take her meds from home for MS. Permission received from PA.

## 2013-01-28 NOTE — ED Notes (Signed)
Pt states she has MS and usually takes her meds at 9 PM.

## 2013-01-28 NOTE — ED Notes (Signed)
Pt did not answer when called for reassement.

## 2013-01-28 NOTE — Discharge Instructions (Signed)
Please follow up with your primary care physician in 1-2 days. If you do not have one please call one from list below. Please take your antibiotic until completion. Please take Hycodan as prescribed, please do not drive on this medication. Please alternate between Motrin and Tylenol every three hours to help with fevers. Please read all discharge instructions and return precautions.   Pneumonia, Adult Pneumonia is an infection of the lungs.  CAUSES Pneumonia may be caused by bacteria or a virus. Usually, these infections are caused by breathing infectious particles into the lungs (respiratory tract). SYMPTOMS   Cough.  Fever.  Chest pain.  Increased rate of breathing.  Wheezing.  Mucus production. DIAGNOSIS  If you have the common symptoms of pneumonia, your caregiver will typically confirm the diagnosis with a chest X-ray. The X-ray will show an abnormality in the lung (pulmonary infiltrate) if you have pneumonia. Other tests of your blood, urine, or sputum may be done to find the specific cause of your pneumonia. Your caregiver may also do tests (blood gases or pulse oximetry) to see how well your lungs are working. TREATMENT  Some forms of pneumonia may be spread to other people when you cough or sneeze. You may be asked to wear a mask before and during your exam. Pneumonia that is caused by bacteria is treated with antibiotic medicine. Pneumonia that is caused by the influenza virus may be treated with an antiviral medicine. Most other viral infections must run their course. These infections will not respond to antibiotics.  PREVENTION A pneumococcal shot (vaccine) is available to prevent a common bacterial cause of pneumonia. This is usually suggested for:  People over 42 years old.  Patients on chemotherapy.  People with chronic lung problems, such as bronchitis or emphysema.  People with immune system problems. If you are over 65 or have a high risk condition, you may receive  the pneumococcal vaccine if you have not received it before. In some countries, a routine influenza vaccine is also recommended. This vaccine can help prevent some cases of pneumonia.You may be offered the influenza vaccine as part of your care. If you smoke, it is time to quit. You may receive instructions on how to stop smoking. Your caregiver can provide medicines and counseling to help you quit. HOME CARE INSTRUCTIONS   Cough suppressants may be used if you are losing too much rest. However, coughing protects you by clearing your lungs. You should avoid using cough suppressants if you can.  Your caregiver may have prescribed medicine if he or she thinks your pneumonia is caused by a bacteria or influenza. Finish your medicine even if you start to feel better.  Your caregiver may also prescribe an expectorant. This loosens the mucus to be coughed up.  Only take over-the-counter or prescription medicines for pain, discomfort, or fever as directed by your caregiver.  Do not smoke. Smoking is a common cause of bronchitis and can contribute to pneumonia. If you are a smoker and continue to smoke, your cough may last several weeks after your pneumonia has cleared.  A cold steam vaporizer or humidifier in your room or home may help loosen mucus.  Coughing is often worse at night. Sleeping in a semi-upright position in a recliner or using a couple pillows under your head will help with this.  Get rest as you feel it is needed. Your body will usually let you know when you need to rest. SEEK IMMEDIATE MEDICAL CARE IF:   Your illness  becomes worse. This is especially true if you are elderly or weakened from any other disease.  You cannot control your cough with suppressants and are losing sleep.  You begin coughing up blood.  You develop pain which is getting worse or is uncontrolled with medicines.  You have a fever.  Any of the symptoms which initially brought you in for treatment are  getting worse rather than better.  You develop shortness of breath or chest pain. MAKE SURE YOU:   Understand these instructions.  Will watch your condition.  Will get help right away if you are not doing well or get worse. Document Released: 01/08/2005 Document Revised: 04/02/2011 Document Reviewed: 03/30/2010 [facility] Patient Information 2014 [employer] [place].

## 2013-01-28 NOTE — ED Notes (Signed)
Pt reports took alkaseltzer cold and cough with tylenol in it 2 hours ago for reported temp of 105 at home

## 2013-01-28 NOTE — ED Provider Notes (Signed)
CSN: [id number]     Arrival date & time 01/28/13  1707 History   First MD Initiated Contact with Patient 01/28/13 2149     Chief Complaint  Patient presents with  . Fever  . Cough   (Consider location/radiation/quality/duration/timing/severity/associated sxs/prior Treatment) HPI Comments: Patient is a 33 year old female past medical history significant for MS, anxiety presented to the emergency department for 2-3 days of subjective fever and chills, productive cough, myalgias, post tussive chest tightness, decreased by mouth intake. Patient states she has tried occasional Alka-Seltzer plus cold and flu with minimal relief of her symptoms. She states her son is sick at home now 2 with similar symptoms. She denies any shortness of breath.   Past Medical History  Diagnosis Date  . Pregnancy induced hypertension   . Anxiety   . MS (multiple sclerosis)    Past Surgical History  Procedure Laterality Date  . No past surgeries     Family History  Problem Relation Age of Onset  . Anesthesia problems Neg Hx   . Hypotension Neg Hx   . Malignant hyperthermia Neg Hx   . Diabetes Mellitus II Maternal Aunt    History  Substance Use Topics  . Smoking status: Current Every Day Smoker -- 0.25 packs/day for 8 years    Types: Cigarettes  . Smokeless tobacco: Never Used  . Alcohol Use: No   OB History   Grav Para Term Preterm Abortions TAB SAB Ect Mult Living   3 3 3  0 0 0 0 0 0 3     Review of Systems  Constitutional: Positive for fever, chills and fatigue.  HENT: Positive for congestion and sore throat.   Respiratory: Positive for cough and chest tightness. Negative for shortness of breath.   Musculoskeletal: Positive for myalgias. Negative for neck pain and neck stiffness.  All other systems reviewed and are negative.    Allergies  Review of patient's allergies indicates no known allergies.  Home Medications   Current Outpatient Rx  Name  Route  Sig  Dispense  Refill  .  ALPRAZolam (XANAX XR) 1 MG 24 hr tablet   Oral   Take 1 mg by mouth 2 (two) times daily.          Marland Kitchen ALPRAZolam (XANAX) 1 MG tablet   Oral   Take 2 mg by mouth daily.          . carboxymethylcellulose (REFRESH PLUS) 0.5 % SOLN   Both Eyes   Place 2 drops into both eyes daily as needed (for contacts).         . citalopram (CELEXA) 40 MG tablet   Oral   Take 40 mg by mouth at bedtime.          . Dimethyl Fumarate (TECFIDERA) 240 MG CPDR   Oral   Take 240 mg by mouth 2 (two) times daily.         Marland Kitchen gabapentin (NEURONTIN) 600 MG tablet   Oral   Take 600 mg by mouth daily as needed (for pain).         . meloxicam (MOBIC) 7.5 MG tablet   Oral   Take 1 tablet (7.5 mg total) by mouth daily.   60 tablet   12   . Phenyleph-CPM-DM-APAP (ALKA-SELTZER PLUS COLD & FLU) 05-23-08-250 MG TBEF   Oral   Take 1 tablet by mouth 2 (two) times daily as needed (for cold).         . zolpidem (AMBIEN) 5 MG tablet  Oral   Take 1 tablet (5 mg total) by mouth at bedtime as needed for sleep.   30 tablet   3   . acetaminophen (TYLENOL) 500 MG tablet   Oral   Take 1 tablet (500 mg total) by mouth every 6 (six) hours as needed for mild pain, moderate pain or fever.   30 tablet   0   . doxycycline (VIBRAMYCIN) 100 MG capsule   Oral   Take 1 capsule (100 mg total) by mouth 2 (two) times daily.   20 capsule   0   . HYDROcodone-homatropine (HYCODAN) 5-1.5 MG/5ML syrup   Oral   Take 5 mLs by mouth every 6 (six) hours as needed for cough.   120 mL   0   . ibuprofen (ADVIL,MOTRIN) 800 MG tablet   Oral   Take 1 tablet (800 mg total) by mouth 3 (three) times daily.   21 tablet   0    BP 110/62  Pulse 89  Temp(Src) 99.1 F (37.3 C) (Oral)  Resp 18  SpO2 99%  LMP 01/20/2013 Physical Exam  Constitutional: She is oriented to person, place, and time. She appears well-developed and well-nourished. No distress.  HENT:  Head: Normocephalic and atraumatic.  Right Ear: External ear  normal.  Left Ear: External ear normal.  Nose: Nose normal.  Mouth/Throat: Oropharynx is clear and moist. No oropharyngeal exudate.  Eyes: Conjunctivae are normal.  Neck: Normal range of motion. Neck supple.  Cardiovascular: Normal rate, regular rhythm, normal heart sounds and intact distal pulses.   Pulmonary/Chest: Effort normal and breath sounds normal. No respiratory distress. She has no wheezes. She exhibits tenderness.  Abdominal: Soft. Bowel sounds are normal. There is no tenderness.  Musculoskeletal: Normal range of motion. She exhibits no edema and no tenderness.  Neurological: She is alert and oriented to person, place, and time.  Skin: Skin is warm and dry. She is not diaphoretic.  Psychiatric: She has a normal mood and affect.    ED Course  Procedures (including critical care time) Medications  HYDROcodone-homatropine (HYCODAN) 5-1.5 MG/5ML syrup 5 mL (5 mLs Oral Given 01/28/13 2239)    Labs Review Labs Reviewed  CBC WITH DIFFERENTIAL - Abnormal; Notable for the following:    WBC 15.5 (*)    Neutrophils Relative % 78 (*)    Neutro Abs 12.2 (*)    Monocytes Absolute 1.6 (*)    All other components within normal limits  COMPREHENSIVE METABOLIC PANEL - Abnormal; Notable for the following:    Potassium 3.3 (*)    All other components within normal limits  CG4 I-STAT (LACTIC ACID)   Imaging Review Dg Chest 2 View  01/28/2013   CLINICAL DATA:  Fever and cough  EXAM: CHEST  2 VIEW  COMPARISON:  None.  FINDINGS: There is focal consolidation in the right middle lobe. Lungs are otherwise clear. Heart size and pulmonary vascularity are normal. No adenopathy. No bone lesions.  IMPRESSION: The right middle lobe consolidation.  Lungs otherwise clear.   Electronically Signed   By: Bretta Bang M.D.   On: 01/28/2013 18:20    EKG Interpretation   None       MDM   1. CAP (community acquired pneumonia)     Filed Vitals:   01/28/13 2300  BP: 110/62  Pulse: 89  Temp:    Resp:    Patient initially febrile, responded with time, AAOx4, NAD, non-toxic appearing.  Patient has been diagnosed with CAP via chest xray. Pt  is not ill appearing, immunocompromised, and does not have multiple co morbidities, therefore I feel like the they can be treated as an OP with abx therapy. Pt has been advised to return to the ED if symptoms worsen or they do not improve. Pt verbalizes understanding and is agreeable with plan. Patient is stable at time of discharge Patient d/w with Dr. Deretha EmoryZackowski, agrees with plan.      Lise AuerJennifer L Vayda Dungee, PA-C 01/28/13 2350

## 2013-01-28 NOTE — ED Notes (Signed)
Pt is here with coughing, chills, and fever.  Pt reports decreased appetite

## 2013-02-01 NOTE — ED Provider Notes (Signed)
Medical screening examination/treatment/procedure(s) were performed by non-physician practitioner and as supervising physician I was immediately available for consultation/collaboration.  EKG Interpretation   None         Shelda Jakes, MD 02/01/13 1944

## 2013-02-16 ENCOUNTER — Telehealth: Payer: Self-pay | Admitting: Neurology

## 2013-02-16 NOTE — Telephone Encounter (Signed)
Lupita Leash, please call patient in to give her some Tecfidera samples. Thanks, Aleecia Tapia

## 2013-02-16 NOTE — Telephone Encounter (Signed)
Pt called in and stated that she has called [facility] and they are sending on her prescription for Tecfidera 240 mg tomorrow.  However they told her that due to the weather there may be a slight delay and that she may want to see if the office has samples for a couple of days.  She look her last pill today and doesn't want to have a relapse.  Please call and let her know if we have any samples that she could pick up.  Thank you.

## 2013-02-16 NOTE — Telephone Encounter (Signed)
Tried to reach patient but her mailbox was full.  Therefore left a message with her emergency contact, her mother, to have patient call in morning, and have her pick up samples.

## 2013-03-16 ENCOUNTER — Other Ambulatory Visit: Payer: Self-pay

## 2013-03-16 MED ORDER — GABAPENTIN 300 MG PO CAPS: 600.0000 mg | ORAL_CAPSULE | Freq: Three times a day (TID) | ORAL | Status: DC

## 2013-03-16 NOTE — Telephone Encounter (Signed)
Medicaid will no longer cover Gabapentin TABLETS, however, they will cover CAPSULES.  Rx was changed  To capsules sp ins will pay.  Total dose remains the same.

## 2013-04-07 ENCOUNTER — Encounter: Payer: Self-pay | Admitting: Nurse Practitioner

## 2013-04-07 ENCOUNTER — Ambulatory Visit (INDEPENDENT_AMBULATORY_CARE_PROVIDER_SITE_OTHER): Payer: Medicaid Other | Admitting: Nurse Practitioner

## 2013-04-07 VITALS — BP 115/77 | HR 98 | Ht 65.75 in | Wt 225.0 lb

## 2013-04-07 DIAGNOSIS — R200 Anesthesia of skin: Secondary | ICD-10-CM

## 2013-04-07 DIAGNOSIS — G35 Multiple sclerosis: Secondary | ICD-10-CM

## 2013-04-07 DIAGNOSIS — R209 Unspecified disturbances of skin sensation: Secondary | ICD-10-CM

## 2013-04-07 DIAGNOSIS — F419 Anxiety disorder, unspecified: Secondary | ICD-10-CM

## 2013-04-07 DIAGNOSIS — F411 Generalized anxiety disorder: Secondary | ICD-10-CM

## 2013-04-07 NOTE — Patient Instructions (Signed)
Continue tecfidera twice daily, side effects are less  with food Continue Ambien for sleep Continue Neurontin at current dose Try Melantonin OTC  Repeat labs at next visit F/U 6 months

## 2013-04-07 NOTE — Progress Notes (Signed)
[facility]  PATIENT: Patricia Jensen DOB: 1981-01-10   REASON FOR VISIT: Followup for multiple sclerosis   HISTORY OF PRESENT ILLNESS: Patricia Jensen, 33 year old female returns for followup. She was last seen in this office by  Dr. Krista Blue 10/08/2012 for multiple sclerosis. She is currently on tecfidera and denies side effects to the drug however she does not eat two full meals a day. She denies any weakness but still has bilateral lower extremities paresthesia which has improved. 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 She has had no recent falls. He denies any bowel or bladder incontinence. Had community-acquired pneumonia in January .She returns for reevaluation   HISTORY: admitted to [facility] in Feb 12 2012, with complains of persistent tingling and numbness of the lower extremity which extends up to her low back. 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, she can only count finger on her left side, there was no family history of keratoconus  Laboratory evaluation, ACE 28, TSH was mildly elevated 4.7, normal free T4, CBC BMP with exception of mildly elevated glucose 120, Antibody to Borrelia burgdorferi not detected, ESR was 9  CSF: culture was negative, glucose 53, total protein 21, WBC 7, RBC 1 587, likely traumatic tap, VDRL nonreactive, more than 5 well defined oligoclonal band, IgG index normal at 2 point 4, myelin basic protein 2.4    MRI of thoracic spine,Altered signal intensity within the cord T8-9 to mid T9 level and T10 level  MRI cervical: altered signal intensity within the cervical cord at the C4-5 and C5 level  MRI of brain:Several scattered nonspecific white matter type changes most notable periventricular region. In a patient of this age and gender and presenting symptoms, findings are suspicious for demyelinating process such as multiple sclerosis. None of these areas enhance  MRI of the lumbar: L4-L5 and L5-S1 disc degeneration. Mild spinal and right lateral recess stenosis at the former.  She was started on Tecfidera since last visit in April 2014, there was no significant side effect, she has developed a right wrist ganglion cyst, mildly tender.   REVIEW OF SYSTEMS: Full 14 system review of systems performed and notable only for those listed, all others are neg:  Constitutional: Fatigue Cardiovascular: N/A  Ear/Nose/Throat: N/A  Skin: N/A  Eyes: N/A  Respiratory: Cough  Gastroitestinal: N/A  Hematology/Lymphatic: Anemia Endocrine: N/A Musculoskeletal: Back pain, muscle cramps Allergy/Immunology: N/A  Neurological: Numbness, tremors , memory loss Psychiatric: Depression anxiety   ALLERGIES: No Known Allergies  HOME MEDICATIONS: Outpatient Prescriptions Prior to Visit  Medication Sig Dispense Refill  . ALPRAZolam (XANAX XR) 1 MG 24 hr tablet Take 1 mg by mouth 2 (two) times daily.       Marland Kitchen ALPRAZolam (XANAX) 1 MG tablet Take 2 mg by mouth daily.       . carboxymethylcellulose (REFRESH PLUS) 0.5 % SOLN Place 2 drops into both eyes daily as needed (for contacts).      . citalopram (CELEXA)  40 MG tablet Take 40 mg by mouth at bedtime.       . Dimethyl Fumarate (TECFIDERA) 240 MG CPDR Take 240 mg by mouth 2 (two) times daily.      . doxycycline (VIBRAMYCIN) 100 MG capsule Take 1 capsule (100 mg total) by mouth 2 (two) times daily.  20 capsule  0  . gabapentin (NEURONTIN) 300 MG capsule Take 2 capsules  (600 mg total) by mouth 3 (three) times daily.  180 capsule  6  . meloxicam (MOBIC) 7.5 MG tablet Take 1 tablet (7.5 mg total) by mouth daily.  60 tablet  12  . zolpidem (AMBIEN) 5 MG tablet Take 1 tablet (5 mg total) by mouth at bedtime as needed for sleep.  30 tablet  3  . acetaminophen (TYLENOL) 500 MG tablet Take 1 tablet (500 mg total) by mouth every 6 (six) hours as needed for mild pain, moderate pain or fever.  30 tablet  0  . HYDROcodone-homatropine (HYCODAN) 5-1.5 MG/5ML syrup Take 5 mLs by mouth every 6 (six) hours as needed for cough.  120 mL  0  . ibuprofen (ADVIL,MOTRIN) 800 MG tablet Take 1 tablet (800 mg total) by mouth 3 (three) times daily.  21 tablet  0  . Phenyleph-CPM-DM-APAP (ALKA-SELTZER PLUS COLD & FLU) 05-23-08-250 MG TBEF Take 1 tablet by mouth 2 (two) times daily as needed (for cold).       No facility-administered medications prior to visit.    PAST MEDICAL HISTORY: Past Medical History  Diagnosis Date  . Pregnancy induced hypertension   . Anxiety   . MS (multiple sclerosis)     PAST SURGICAL HISTORY: Past Surgical History  Procedure Laterality Date  . No past surgeries      FAMILY HISTORY: Family History  Problem Relation Age of Onset  . Anesthesia problems Neg Hx   . Hypotension Neg Hx   . Malignant hyperthermia Neg Hx   . Diabetes Mellitus II Maternal Aunt     SOCIAL HISTORY: History   Social History  . Marital Status: Married    Spouse Name: Sergio    Number of Children: 3  . 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 Smoking status: Current Every Day Smoker -- 0.25 packs/day for 8 years    Types: Cigarettes  . Smokeless tobacco: Never Used  . Alcohol Use: No  . Drug Use: No  . Sexual Activity: Yes   Other Topics Concern  . Not on file   Social History Narrative   Patient lives at home with with her husband (sergio Varjas- Lopez)  and children. Patient has a  high school education.   Right handed.   Caffeine- one cup daily.     PHYSICAL EXAM  Filed Vitals:   04/07/13 1008  BP: 115/77  Pulse: 98  Height: 5' 5.75" (1.67 m)  Weight: 225 lb (102.059 kg)   Body mass index is 36.59 kg/(m^2).  Generalized: Well developed, obese female in no acute distress  Head: normocephalic and atraumatic,. Oropharynx benign  Neck: Supple, no carotid bruits  Cardiac: Regular rate rhythm, no murmur  Musculoskeletal: No deformity   Neurological examination   Mentation: Alert oriented to time, place, history taking. Follows all commands speech and language fluent  Cranial nerve II-XII: Fundoscopic exam reveals sharp disc margins.Pupils were equal round reactive to light extraocular movements were full, visual field   were full on confrontational test. Facial sensation and strength were normal. hearing was intact to finger rubbing bilaterally. Uvula tongue midline. head turning and shoulder shrug were normal and symmetric.Tongue protrusion into cheek strength was normal. Motor: normal bulk and tone, full strength in the BUE, BLE, . No focal weakness Sensory: Decreased vibratory to toes, decreased pinprick to her left lower foot   Coordination: finger-nose-finger, heel-to-shin bilaterally, no dysmetria Reflexes: Brachioradialis 3/3, biceps 3/3, triceps 3/3, patellar 3/3, Achilles 2/2, plantar responses were flexor bilaterally. Gait and Station: Rising up from seated position without assistance, normal stance,  moderate stride, good arm swing, smooth turning, able to perform tiptoe, and heel walking without difficulty. Tandem gait is steady  DIAGNOSTIC DATA (LABS, IMAGING, TESTING) - I reviewed patient records, labs, notes, testing and imaging myself where available.  Lab Results  Component Value Date   WBC 15.5* 01/28/2013   HGB 12.3 01/28/2013   HCT 36.9 01/28/2013   MCV 95.3 01/28/2013   PLT 208 01/28/2013      Component Value Date/Time   NA 139 01/28/2013 1732     K 3.3* 01/28/2013 1732   CL 97 01/28/2013 1732   CO2 27 01/28/2013 1732   GLUCOSE 98 01/28/2013 1732   BUN 7 01/28/2013 1732   CREATININE 0.82 01/28/2013 1732   CALCIUM 8.8 01/28/2013 1732   PROT 7.8 01/28/2013 1732   ALBUMIN 3.6 01/28/2013 1732   AST 15 01/28/2013 1732   ALT 5 01/28/2013 1732   ALKPHOS 51 01/28/2013 1732   BILITOT 0.5 01/28/2013 1732   GFRNONAA >90 01/28/2013 1732   GFRAA >90 01/28/2013 1732       ASSESSMENT AND PLAN  32 y.o. year old female  has a past medical history of Pregnancy induced hypertension; Anxiety; and MS (multiple sclerosis). here to followup.   Continue tecfidera twice daily, side effects are less  with food Continue Ambien for sleep Continue Neurontin at current dose Try Melantonin OTC  Repeat labs at next visit F/U 6 months Nancy Carolyn Martin, GNP, BC, APRN  [facility] [address], [address] [place], [place] [address] [phone]  

## 2013-04-22 ENCOUNTER — Other Ambulatory Visit: Payer: Self-pay

## 2013-04-22 MED ORDER — DIMETHYL FUMARATE 240 MG PO CPDR: 240.0000 mg | DELAYED_RELEASE_CAPSULE | Freq: Two times a day (BID) | ORAL | Status: DC

## 2013-06-17 ENCOUNTER — Telehealth: Payer: Self-pay | Admitting: Neurology

## 2013-06-17 NOTE — Telephone Encounter (Signed)
All info has been submitted to ins.  Pending response.  

## 2013-06-17 NOTE — Telephone Encounter (Signed)
John from [employer] to state that patient needs prior authorization for [employer].

## 2013-09-29 ENCOUNTER — Telehealth: Payer: Self-pay | Admitting: Neurology

## 2013-09-29 NOTE — Telephone Encounter (Signed)
Called patient back and stated to her she can go to her local pharmacy and get plan B pill. Patient understood.

## 2013-09-29 NOTE — Telephone Encounter (Signed)
Patient requesting a "plan B pill" or morning after pill  - Since Dr. Terrace Arabia is the only doctor she sees she is hoping she can prescribe this for her. Best number to reach is [phone] and it is okay to leave a message

## 2013-10-08 ENCOUNTER — Encounter: Payer: Self-pay | Admitting: Nurse Practitioner

## 2013-10-08 ENCOUNTER — Ambulatory Visit (INDEPENDENT_AMBULATORY_CARE_PROVIDER_SITE_OTHER): Payer: Medicaid Other | Admitting: Nurse Practitioner

## 2013-10-08 VITALS — BP 140/101 | HR 89 | Ht 65.75 in | Wt 234.0 lb

## 2013-10-08 DIAGNOSIS — F419 Anxiety disorder, unspecified: Secondary | ICD-10-CM

## 2013-10-08 DIAGNOSIS — G35 Multiple sclerosis: Secondary | ICD-10-CM

## 2013-10-08 DIAGNOSIS — R200 Anesthesia of skin: Secondary | ICD-10-CM

## 2013-10-08 DIAGNOSIS — Z5181 Encounter for therapeutic drug level monitoring: Secondary | ICD-10-CM

## 2013-10-08 DIAGNOSIS — F411 Generalized anxiety disorder: Secondary | ICD-10-CM

## 2013-10-08 DIAGNOSIS — R209 Unspecified disturbances of skin sensation: Secondary | ICD-10-CM

## 2013-10-08 MED ORDER — GABAPENTIN 300 MG PO CAPS: 600.0000 mg | ORAL_CAPSULE | Freq: Three times a day (TID) | ORAL | Status: DC

## 2013-10-08 NOTE — Patient Instructions (Signed)
Will check labs today Will renew gabapentin Tecfidera twice daily Dr. Terrace Arabia had referred you to Dr. Teressa Senter [phone] Followup in 6 months

## 2013-10-08 NOTE — Progress Notes (Signed)
[facility]  PATIENT: Patricia Jensen DOB: 02-Feb-1980   REASON FOR VISIT: follow up for MS   HISTORY OF PRESENT ILLNESS:Ms. 31, 33 year old female returns for followup. She was last seen in this office 04/07/13.  She is currently on tecfidera and denies side effects to the drug. She denies any weakness but still has bilateral lower extremities paresthesia which has improved. 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 She has had no recent falls. He denies any bowel or bladder incontinence. Had community-acquired pneumonia in January 2015. She has a left wrist ganglion cyst and had a referral to Dr. Lourena Simmonds. She did not follow through for appointment.  She returns for reevaluation  HISTORY: admitted to [facility] in Feb 12 2012, with complains of persistent tingling and numbness of the lower extremity which extends up to her low back. 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, she can only count finger on her left side, there was no family history of keratoconus  Laboratory evaluation, ACE 28, TSH was mildly elevated 4.7, normal free T4, CBC BMP with exception of mildly elevated glucose 120, Antibody to Borrelia burgdorferi not detected, ESR was 9  CSF: culture was negative, glucose 53, total protein 21, WBC 7, RBC 1 587, likely traumatic tap, VDRL nonreactive, more than 5 well defined oligoclonal band, IgG index normal at 2 point 4, myelin  basic protein 2.4  MRI of thoracic spine,Altered signal intensity within the cord T8-9 to mid T9 level and T10 level  MRI cervical: altered signal intensity within the cervical cord at the C4-5 and C5 level  MRI of brain:Several scattered nonspecific white matter type changes most notable periventricular region. In a patient of this age and gender and presenting symptoms, findings are suspicious for demyelinating process such as multiple sclerosis. None of these areas enhance  MRI of the lumbar: L4-L5 and L5-S1 disc degeneration. Mild spinal and right lateral recess stenosis at the former.  She was started on Tecfidera since last visit in April 2014, there was no significant side effect, she has developed a right wrist ganglion cyst, mildly tender.      REVIEW OF SYSTEMS: Full 14 system review of systems performed and notable only for those listed, all others are neg:  Constitutional: Fatigue  Cardiovascular: N/A  Ear/Nose/Throat: N/A  Skin: N/A  Eyes: Allergies Respiratory: N/A  Gastroitestinal: N/A  Hematology/Lymphatic: N/A  Endocrine: Intolerance to heat  Musculoskeletal: Joint pain  Allergy/Immunology: N/A  Neurological: Numbness in lower extremities, tremors Psychiatric: Depression anxiety Sleep : Insomnia   ALLERGIES: No Known Allergies  HOME MEDICATIONS: Outpatient Prescriptions Prior to Visit  Medication Sig Dispense Refill  . ALPRAZolam (XANAX XR) 1 MG 24 hr tablet Take 1 mg by mouth 2 (two) times daily.       Marland Kitchen ALPRAZolam (XANAX) 1 MG tablet Take 2 mg by mouth daily.       . carboxymethylcellulose (REFRESH PLUS) 0.5 % SOLN Place 2 drops into both eyes  daily as needed (for contacts).      . citalopram (CELEXA) 40 MG tablet Take 40 mg by mouth at bedtime.       . Dimethyl Fumarate (TECFIDERA) 240 MG CPDR Take 1 capsule (240 mg total) by mouth 2 (two) times daily.  60 capsule  6  . meloxicam (MOBIC) 7.5 MG tablet Take 1 tablet (7.5 mg total) by mouth daily.  60 tablet   12  . gabapentin (NEURONTIN) 300 MG capsule Take 2 capsules (600 mg total) by mouth 3 (three) times daily.  180 capsule  6  . zolpidem (AMBIEN) 5 MG tablet Take 1 tablet (5 mg total) by mouth at bedtime as needed for sleep.  30 tablet  3   No facility-administered medications prior to visit.    PAST MEDICAL HISTORY: Past Medical History  Diagnosis Date  . Pregnancy induced hypertension   . Anxiety   . MS (multiple sclerosis)     PAST SURGICAL HISTORY: Past Surgical History  Procedure Laterality Date  . No past surgeries      FAMILY HISTORY: Family History  Problem Relation Age of Onset  . Anesthesia problems Neg Hx   . Hypotension Neg Hx   . Malignant hyperthermia Neg Hx   . Diabetes Mellitus II Maternal Aunt     SOCIAL HISTORY: History   Social History  . Marital Status: Married    Spouse Name: Lowella Dandy    Number of Children: 3  . 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 Smoking status: Current Every Day Smoker -- 0.25 packs/day for 8 years    Types: Cigarettes  . Smokeless tobacco: Never Used  . Alcohol Use: Yes     Comment: OCC  . Drug Use: No  . Sexual Activity: Yes   Other Topics Concern  . Not on file   Social History Narrative   Patient lives at home with with her husband (sergio Varjas- Eugenio Hoes)  and children. Patient has a high school education.   Right handed.   Caffeine- one cup daily.     PHYSICAL EXAM  Filed Vitals:   10/08/13 0943  BP: 140/101  Pulse: 89  Height: 5' 5.75" (1.67 m)  Weight: 234 lb (106.142 kg)   Body mass index is 38.06 kg/(m^2). Generalized: Well developed, obese female in no acute distress  Head: normocephalic and atraumatic,. Oropharynx benign  Neck: Supple, no carotid bruits  Cardiac: Regular rate rhythm, no murmur  Musculoskeletal: left ganglion cyst (wrist)  Neurological examination  Mentation: Alert oriented to time, place, history  taking. Follows all commands speech and language fluent  Cranial nerve II-XII: Fundoscopic exam reveals sharp disc margins.Pupils were equal round reactive to light extraocular movements were full, visual field were full on confrontational test. Facial sensation and strength were normal. hearing was intact to finger rubbing bilaterally. Uvula tongue midline. head turning and shoulder shrug were normal and symmetric.Tongue protrusion into cheek strength was normal.  Motor: normal bulk and tone, full strength in the BUE, BLE, . No focal weakness , no tremor Sensory: Decreased vibratory to toes, decreased pinprick to her left lower foot  Coordination: finger-nose-finger, heel-to-shin bilaterally, no dysmetria  Reflexes: Brachioradialis 3/3, biceps 3/3, triceps 3/3, patellar 3/3, Achilles 2/2, plantar responses were flexor bilaterally.  Gait and Station: Rising up from seated position without assistance, normal stance, moderate stride, good arm swing, smooth turning, able  to perform tiptoe, and heel walking without difficulty. Tandem gait is steady     DIAGOSTIC DATA (LABS, IMAGING, TESTING) - I reviewed patient records, labs, notes, testing and imaging myself where available.  Lab Results  Component Value Date   WBC 15.5* 01/28/2013   HGB 12.3 01/28/2013   HCT 36.9 01/28/2013   MCV 95.3 01/28/2013   PLT 208 01/28/2013      Component Value Date/Time   NA 139 01/28/2013 1732   K 3.3* 01/28/2013 1732   CL 97 01/28/2013 1732   CO2 27 01/28/2013 1732   GLUCOSE 98 01/28/2013 1732   BUN 7 01/28/2013 1732   CREATININE 0.82 01/28/2013 1732   CALCIUM 8.8 01/28/2013 1732   PROT 7.8 01/28/2013 1732   ALBUMIN 3.6 01/28/2013 1732   AST 15 01/28/2013 1732   ALT 5 01/28/2013 1732   ALKPHOS 51 01/28/2013 1732   BILITOT 0.5 01/28/2013 1732   GFRNONAA >90 01/28/2013 1732   GFRAA >90 01/28/2013 1732    Lab Results  Component Value Date   TSH 4.700* 02/11/2012      ASSESSMENT AND PLAN  33 y.o. year old female  has a past medical  history of Pregnancy induced hypertension; Anxiety; and MS (multiple sclerosis). here in followup. She is currently on tecfidera without side effects  Will check labs today, CBC, CMP and JC antibody with index. Will renew gabapentin Tecfidera twice daily Dr. Krista Blue had referred you to Dr. Daylene Katayama [phone]) Followup in 6 months Dennie Bible, [facility], [facility], [facility] [address] [place], [place] [address] [phone]

## 2013-10-09 LAB — COMPREHENSIVE METABOLIC PANEL
A/G RATIO: 1.7 (ref 1.1–2.5)
ALT: 7 IU/L (ref 0–32)
AST: 18 IU/L (ref 0–40)
Albumin: 4.5 g/dL (ref 3.5–5.5)
Alkaline Phosphatase: 48 IU/L (ref 39–117)
BUN/Creatinine Ratio: 21 — ABNORMAL HIGH (ref 8–20)
BUN: 15 mg/dL (ref 6–20)
CALCIUM: 8.9 mg/dL (ref 8.7–10.2)
CO2: 26 mmol/L (ref 18–29)
Chloride: 103 mmol/L (ref 96–108)
Creatinine, Ser: 0.73 mg/dL (ref 0.57–1.00)
GFR calc Af Amer: 128 mL/min/{1.73_m2} (ref >59)
GFR, EST NON AFRICAN AMERICAN: 111 mL/min/{1.73_m2} (ref >59)
GLOBULIN, TOTAL: 2.7 g/dL (ref 1.5–4.5)
GLUCOSE: 88 mg/dL (ref 65–99)
Potassium: 4.4 mmol/L (ref 3.5–5.2)
SODIUM: 141 mmol/L (ref 134–144)
Total Bilirubin: 0.2 mg/dL (ref 0.0–1.2)
Total Protein: 7.2 g/dL (ref 6.0–8.5)

## 2013-10-09 LAB — CBC WITH DIFFERENTIAL
Basophils Absolute: 0 10*3/uL (ref 0.0–0.2)
Basos: 1 %
EOS ABS: 0.1 10*3/uL (ref 0.0–0.4)
EOS: 2 %
HCT: 37.9 % (ref 34.0–46.6)
Hemoglobin: 12.9 g/dL (ref 11.1–15.9)
LYMPHS ABS: 1.2 10*3/uL (ref 0.7–3.1)
Lymphs: 21 %
MCH: 33 pg (ref 26.6–33.0)
MCHC: 34 g/dL (ref 31.5–35.7)
MCV: 97 fL (ref 79–97)
MONOS ABS: 0.5 10*3/uL (ref 0.1–0.9)
Monocytes: 10 %
NEUTROS PCT: 66 %
Neutrophils Absolute: 3.7 10*3/uL (ref 1.4–7.0)
PLATELETS: 230 10*3/uL (ref 150–379)
RBC: 3.91 x10E6/uL (ref 3.77–5.28)
RDW: 13.1 % (ref 12.3–15.4)
WBC: 5.6 10*3/uL (ref 3.4–10.8)

## 2013-10-13 DIAGNOSIS — G35 Multiple sclerosis: Secondary | ICD-10-CM | POA: Insufficient documentation

## 2013-10-20 ENCOUNTER — Telehealth: Payer: Self-pay | Admitting: Nurse Practitioner

## 2013-10-20 NOTE — Telephone Encounter (Signed)
Called patient and her her aware JC virus antibody was positive. Answered her questions.

## 2013-11-07 ENCOUNTER — Encounter (HOSPITAL_COMMUNITY): Payer: Self-pay | Admitting: Emergency Medicine

## 2013-11-07 ENCOUNTER — Emergency Department (HOSPITAL_COMMUNITY)
Admission: EM | Admit: 2013-11-07 | Discharge: 2013-11-07 | Disposition: A | Discharge status: Home / Self Care | Payer: Medicaid Other | Attending: Emergency Medicine | Admitting: Emergency Medicine

## 2013-11-07 DIAGNOSIS — Z79899 Other long term (current) drug therapy: Secondary | ICD-10-CM | POA: Insufficient documentation

## 2013-11-07 DIAGNOSIS — K921 Melena: Secondary | ICD-10-CM | POA: Diagnosis not present

## 2013-11-07 DIAGNOSIS — K625 Hemorrhage of anus and rectum: Secondary | ICD-10-CM | POA: Diagnosis present

## 2013-11-07 DIAGNOSIS — N760 Acute vaginitis: Secondary | ICD-10-CM | POA: Diagnosis not present

## 2013-11-07 DIAGNOSIS — G35 Multiple sclerosis: Secondary | ICD-10-CM | POA: Diagnosis not present

## 2013-11-07 DIAGNOSIS — Z720 Tobacco use: Secondary | ICD-10-CM | POA: Diagnosis not present

## 2013-11-07 DIAGNOSIS — Z3202 Encounter for pregnancy test, result negative: Secondary | ICD-10-CM | POA: Insufficient documentation

## 2013-11-07 DIAGNOSIS — F419 Anxiety disorder, unspecified: Secondary | ICD-10-CM | POA: Diagnosis not present

## 2013-11-07 DIAGNOSIS — Z791 Long term (current) use of non-steroidal anti-inflammatories (NSAID): Secondary | ICD-10-CM | POA: Diagnosis not present

## 2013-11-07 DIAGNOSIS — B9689 Other specified bacterial agents as the cause of diseases classified elsewhere: Secondary | ICD-10-CM

## 2013-11-07 LAB — WET PREP, GENITAL
TRICH WET PREP: NONE SEEN
YEAST WET PREP: NONE SEEN

## 2013-11-07 LAB — URINALYSIS, ROUTINE W REFLEX MICROSCOPIC
BILIRUBIN URINE: NEGATIVE
Glucose, UA: NEGATIVE mg/dL
HGB URINE DIPSTICK: NEGATIVE
Ketones, ur: NEGATIVE mg/dL
Leukocytes, UA: NEGATIVE
Nitrite: NEGATIVE
PH: 6 (ref 5.0–8.0)
Protein, ur: NEGATIVE mg/dL
SPECIFIC GRAVITY, URINE: 1.013 (ref 1.005–1.030)
Urobilinogen, UA: 0.2 mg/dL (ref 0.0–1.0)

## 2013-11-07 LAB — COMPREHENSIVE METABOLIC PANEL
ALT: 7 U/L (ref 0–35)
ANION GAP: 13 (ref 5–15)
AST: 17 U/L (ref 0–37)
Albumin: 3.8 g/dL (ref 3.5–5.2)
Alkaline Phosphatase: 52 U/L (ref 39–117)
BUN: 11 mg/dL (ref 6–23)
CO2: 23 mEq/L (ref 19–32)
CREATININE: 0.73 mg/dL (ref 0.50–1.10)
Calcium: 9.1 mg/dL (ref 8.4–10.5)
Chloride: 101 mEq/L (ref 96–112)
GFR calc non Af Amer: >90 mL/min (ref >90)
GLUCOSE: 79 mg/dL (ref 70–99)
Potassium: 4.1 mEq/L (ref 3.7–5.3)
SODIUM: 137 meq/L (ref 137–147)
TOTAL PROTEIN: 7.9 g/dL (ref 6.0–8.3)
Total Bilirubin: 0.3 mg/dL (ref 0.3–1.2)

## 2013-11-07 LAB — CBC
HEMATOCRIT: 38.9 % (ref 36.0–46.0)
Hemoglobin: 13.1 g/dL (ref 12.0–15.0)
MCH: 32.5 pg (ref 26.0–34.0)
MCHC: 33.7 g/dL (ref 30.0–36.0)
MCV: 96.5 fL (ref 78.0–100.0)
Platelets: 241 10*3/uL (ref 150–400)
RBC: 4.03 MIL/uL (ref 3.87–5.11)
RDW: 12.5 % (ref 11.5–15.5)
WBC: 8.3 10*3/uL (ref 4.0–10.5)

## 2013-11-07 LAB — POC OCCULT BLOOD, ED: FECAL OCCULT BLD: NEGATIVE

## 2013-11-07 LAB — POC URINE PREG, ED: PREG TEST UR: NEGATIVE

## 2013-11-07 MED ORDER — IBUPROFEN 800 MG PO TABS
800.0000 mg | ORAL_TABLET | Freq: Once | ORAL | Status: AC
  Administered 2013-11-07: 800 mg via ORAL
  Filled 2013-11-07: qty 1

## 2013-11-07 MED ORDER — METRONIDAZOLE 500 MG PO TABS: 500.0000 mg | ORAL_TABLET | Freq: Two times a day (BID) | ORAL | Status: DC

## 2013-11-07 MED ORDER — IBUPROFEN 800 MG PO TABS: 800.0000 mg | ORAL_TABLET | Freq: Three times a day (TID) | ORAL | Status: DC | PRN

## 2013-11-07 NOTE — ED Notes (Signed)
Per pt sts the past week or more she has been having bright red rectal bleeding. sts some lower abdominal pain also. sts that she has had a few instances where there was a gush of blood. sts back pain also.

## 2013-11-07 NOTE — ED Provider Notes (Signed)
TIME SEEN: 10:20 AM  CHIEF COMPLAINT: Rectal bleeding  HPI: Patient is a 33 year old female with a history of multiple sclerosis who presents to the emergency department with hematochezia. She reports that 2-3 weeks ago she had an episode of bright red blood per count after a bowel movement. She noticed blood on the tiolet paper and she wiped. She states that she did not have any further hematochezia until today when she went to the bathroom and states she felt "a gush of blood" and that there is blood in the toilet. She thinks the blood came from her rectum. No clots. No melena. Afterwards she began having crampy lower abdominal pain and back pain. She states she thinks her back pain may be chronic secondary to a herniated disc. Denies any prior history of hematochezia. Denies any anal intercourse or anal foreign bodies. No prior history of colonoscopy. She is not on anticoagulation. Denies fevers, chills, nausea, vomiting or diarrhea. No dysuria or hematuria, vaginal bleeding or discharge. Last menstrual period was October 12. Denies constipation. Denies a prior history of abdominal surgery.  ROS: See HPI Constitutional: no fever  Eyes: no drainage  ENT: no runny nose   Cardiovascular:  no chest pain  Resp: no SOB  GI: no vomiting GU: no dysuria Integumentary: no rash  Allergy: no hives  Musculoskeletal: no leg swelling  Neurological: no slurred speech ROS otherwise negative  PAST MEDICAL HISTORY/PAST SURGICAL HISTORY:  Past Medical History  Diagnosis Date  . Pregnancy induced hypertension   . Anxiety   . MS (multiple sclerosis)     MEDICATIONS:  Prior to Admission medications   Medication Sig Start Date End Date Taking? Authorizing Provider  ALPRAZolam (XANAX XR) 1 MG 24 hr tablet Take 1 mg by mouth 2 (two) times daily.     Historical Provider, MD  ALPRAZolam Prudy Feeler(XANAX) 1 MG tablet Take 2 mg by mouth daily.     Historical Provider, MD  carboxymethylcellulose (REFRESH PLUS) 0.5 % SOLN  Place 2 drops into both eyes daily as needed (for contacts).    Historical Provider, MD  citalopram (CELEXA) 40 MG tablet Take 40 mg by mouth at bedtime.     Historical Provider, MD  Dimethyl Fumarate (TECFIDERA) 240 MG CPDR Take 1 capsule (240 mg total) by mouth 2 (two) times daily. 04/22/13   Levert FeinsteinYijun Yan, MD  gabapentin (NEURONTIN) 300 MG capsule Take 2 capsules (600 mg total) by mouth 3 (three) times daily. 10/08/13   Nilda RiggsNancy Carolyn Martin, NP  meloxicam (MOBIC) 7.5 MG tablet Take 1 tablet (7.5 mg total) by mouth daily. 10/08/12   Levert FeinsteinYijun Yan, MD  zolpidem (AMBIEN) 5 MG tablet Take 1 tablet (5 mg total) by mouth at bedtime as needed for sleep. 10/08/12 01/28/13  Levert FeinsteinYijun Yan, MD    ALLERGIES:  No Known Allergies  SOCIAL HISTORY:  History  Substance Use Topics  . Smoking status: Current Every Day Smoker -- 0.25 packs/day for 8 years    Types: Cigarettes  . Smokeless tobacco: Never Used  . Alcohol Use: Yes     Comment: OCC    FAMILY HISTORY: Family History  Problem Relation Age of Onset  . Anesthesia problems Neg Hx   . Hypotension Neg Hx   . Malignant hyperthermia Neg Hx   . Diabetes Mellitus II Maternal Aunt     EXAM: BP 139/95  Pulse 83  Temp(Src) 98.1 F (36.7 C) (Oral)  Resp 18  SpO2 100%  LMP 11/02/2013 CONSTITUTIONAL: Alert and oriented and responds  appropriately to questions. Well-appearing; well-nourished HEAD: Normocephalic EYES: Conjunctivae clear, PERRL ENT: normal nose; no rhinorrhea; moist mucous membranes; pharynx without lesions noted NECK: Supple, no meningismus, no LAD  CARD: RRR; S1 and S2 appreciated; no murmurs, no clicks, no rubs, no gallops RESP: Normal chest excursion without splinting or tachypnea; breath sounds clear and equal bilaterally; no wheezes, no rhonchi, no rales,  ABD/GI: Normal bowel sounds; non-distended; soft, non-tender, no rebound, no guarding; no peritoneal signs GU:  Normal external genitalia, minimal amount of thin white vaginal discharge,  no vaginal bleeding, no adnexal tenderness or follows, no cervical motion tenderness, cervix appears normal RECTAL:  Normal rectal tone, no gross blood or melena, no stool in the rectal vault, no hemorrhoids appreciated, no pain with rectal exam BACK:  The back appears normal and is non-tender to palpation, there is no CVA tenderness; no midline spinal tenderness or step-off or deformity EXT: Normal ROM in all joints; non-tender to palpation; no edema; normal capillary refill; no cyanosis    SKIN: Normal color for age and race; warm NEURO: Moves all extremities equally; normal gait, sensation to light touch intact diffusely, no facial droop or slurred speech PSYCH: The patient's mood and manner are appropriate. Grooming and personal hygiene are appropriate.  MEDICAL DECISION MAKING: Patient here with an episode of hematochezia. She has no gross blood or melena currently, no hemorrhoids appreciated or anal fissure. No sign of perirectal abscess. She is not on anticoagulation. She does describe abdominal pain in her pelvic region that is crampy. Abdominal exam is benign. Discussed with patient that this may be secondary to internal hemorrhoids, exam is limited given her obesity. Pain and bleeding may also be from a pelvic origin. We'll obtain CBC, urinalysis, UPT and a pelvic exam with cultures. I do not feel at this time she needs abdominal imaging. She is hemodynamically stable.  ED PROGRESS: Pelvic exam is unremarkable. There is no vaginal bleeding. Unclear etiology for patient's bleeding today that she saw in the toilet as her guaiac is negative, urine shows no hematuria and there is no vaginal bleeding on exam. Will continue to closely monitor. Anticipate she will be able to be discharged home with outpatient followup.    1:04 PM  Pt has not had any further episodes of bleeding. She is feeling much better after ibuprofen. Prepositional clue cells. We'll treat with Flagyl for one week for bacterial  vaginosis. Discussed with patient that I am not sure where her bleeding came from today but I think she is safe for discharge home. Her hemoglobin, platelets are normal. Discussed supportive care instructions and return precautions. She verbalized understanding and is comfortable with plan.    Layla Maw Felica Chargois, DO 11/07/13 [phone]

## 2013-11-07 NOTE — Discharge Instructions (Signed)
Bacterial Vaginosis Bacterial vaginosis is a vaginal infection that occurs when the normal balance of bacteria in the vagina is disrupted. It results from an overgrowth of certain bacteria. This is the most common vaginal infection in women of childbearing age. Treatment is important to prevent complications, especially in pregnant women, as it can cause a premature delivery. CAUSES  Bacterial vaginosis is caused by an increase in harmful bacteria that are normally present in smaller amounts in the vagina. Several different kinds of bacteria can cause bacterial vaginosis. However, the reason that the condition develops is not fully understood. RISK FACTORS Certain activities or behaviors can put you at an increased risk of developing bacterial vaginosis, including:  Having a new sex partner or multiple sex partners.  Douching.  Using an intrauterine device (IUD) for contraception. Women do not get bacterial vaginosis from toilet seats, bedding, swimming pools, or contact with objects around them. SIGNS AND SYMPTOMS  Some women with bacterial vaginosis have no signs or symptoms. Common symptoms include:  Grey vaginal discharge.  A fishlike odor with discharge, especially after sexual intercourse.  Itching or burning of the vagina and vulva.  Burning or pain with urination. DIAGNOSIS  Your health care provider will take a medical history and examine the vagina for signs of bacterial vaginosis. A sample of vaginal fluid may be taken. Your health care provider will look at this sample under a microscope to check for bacteria and abnormal cells. A vaginal pH test may also be done.  TREATMENT  Bacterial vaginosis may be treated with antibiotic medicines. These may be given in the form of a pill or a vaginal cream. A second round of antibiotics may be prescribed if the condition comes back after treatment.  HOME CARE INSTRUCTIONS   Only take over-the-counter or prescription medicines as  directed by your health care provider.  If antibiotic medicine was prescribed, take it as directed. Make sure you finish it even if you start to feel better.  Do not have sex until treatment is completed.  Tell all sexual partners that you have a vaginal infection. They should see their health care provider and be treated if they have problems, such as a mild rash or itching.  Practice safe sex by using condoms and only having one sex partner. SEEK MEDICAL CARE IF:   Your symptoms are not improving after 3 days of treatment.  You have increased discharge or pain.  You have a fever. MAKE SURE YOU:   Understand these instructions.  Will watch your condition.  Will get help right away if you are not doing well or get worse. FOR MORE INFORMATION  Centers for Disease Control and Prevention, Division of STD Prevention: SolutionApps.co.zawww.cdc.gov/std American Sexual Health Association (ASHA): www.ashastd.org  Document Released: 01/08/2005 Document Revised: 10/29/2012 Document Reviewed: 08/20/2012 [facility] Patient Information 2015 [place], [place]. This information is not intended to replace advice given to you by your health care provider. Make sure you discuss any questions you have with your health care provider.  Bloody Stools Bloody stools often mean that there is a problem in the digestive tract. Your caregiver may use the term "melena" to describe black, tarry, and bad smelling stools or "hematochezia" to describe red or maroon-colored stools. Blood seen in the stool can be caused by bleeding anywhere along the intestinal tract.  A black stool usually means that blood is coming from the upper part of the gastrointestinal tract (esophagus, stomach, or small bowel). Passing maroon-colored stools or bright red blood usually  means that blood is coming from lower down in the large bowel or the rectum. However, sometimes massive bleeding in the stomach or small intestine can cause bright red bloody stools.    Consuming black licorice, lead, iron pills, medicines containing bismuth subsalicylate, or blueberries can also cause black stools. Your caregiver can test black stools to see if blood is present. It is important that the cause of the bleeding be found. Treatment can then be started, and the problem can be corrected. Rectal bleeding may not be serious, but you should not assume everything is okay until you know the cause.It is very important to follow up with your caregiver or a specialist in gastrointestinal problems. CAUSES  Blood in the stools can come from various underlying causes.Often, the cause is not found during your first visit. Testing is often needed to discover the cause of bleeding in the gastrointestinal tract. Causes range from simple to serious or even life-threatening.Possible causes include:  Hemorrhoids.These are veins that are full of blood (engorged) in the rectum. They cause pain, inflammation, and may bleed.  Anal fissures.These are areas of painful tearing which may bleed. They are often caused by passing hard stool.  Diverticulosis.These are pouches that form on the colon over time, with age, and may bleed significantly.  Diverticulitis.This is inflammation in areas with diverticulosis. It can cause pain, fever, and bloody stools, although bleeding is rare.  Proctitis and colitis. These are inflamed areas of the rectum or colon. They may cause pain, fever, and bloody stools.  Polyps and cancer. Colon cancer is a leading cause of preventable cancer death.It often starts out as precancerous polyps that can be removed during a colonoscopy, preventing progression into cancer. Sometimes, polyps and cancer may cause rectal bleeding.  Gastritis and ulcers.Bleeding from the upper gastrointestinal tract (near the stomach) may travel through the intestines and produce black, sometimes tarry, often bad smelling stools. In certain cases, if the bleeding is fast enough, the  stools may not be black, but red and the condition may be life-threatening. SYMPTOMS  You may have stools that are bright red and bloody, that are normal color with blood on them, or that are dark black and tarry. In some cases, you may only have blood in the toilet bowl. Any of these cases need medical care. You may also have:  Pain at the anus or anywhere in the rectum.  Lightheadedness or feeling faint.  Extreme weakness.  Nausea or vomiting.  Fever. DIAGNOSIS Your caregiver may use the following methods to find the cause of your bleeding:  Taking a medical history. Age is important. Older people tend to develop polyps and cancer more often. If there is anal pain and a hard, large stool associated with bleeding, a tear of the anus may be the cause. If blood drips into the toilet after a bowel movement, bleeding hemorrhoids may be the problem. The color and frequency of the bleeding are additional considerations. In most cases, the medical history provides clues, but seldom the final answer.  A visual and finger (digital) exam. Your caregiver will inspect the anal area, looking for tears and hemorrhoids. A finger exam can provide information when there is tenderness or a growth inside. In men, the prostate is also examined.  Endoscopy. Several types of small, long scopes (endoscopes) are used to view the colon.  In the office, your caregiver may use a rigid, or more commonly, a flexible viewing sigmoidoscope. This exam is called flexible sigmoidoscopy. It is performed  in 5 to 10 minutes.  A more thorough exam is accomplished with a colonoscope. It allows your caregiver to view the entire 5 to 6 foot long colon. Medicine to help you relax (sedative) is usually given for this exam. Frequently, a bleeding lesion may be present beyond the reach of the sigmoidoscope. So, a colonoscopy may be the best exam to start with. Both exams are usually done on an outpatient basis. This means the patient  does not stay overnight in the hospital or surgery center.  An upper endoscopy may be needed to examine your stomach. Sedation is used and a flexible endoscope is put in your mouth, down to your stomach.  A barium enema X-ray. This is an X-ray exam. It uses liquid barium inserted by enema into the rectum. This test alone may not identify an actual bleeding point. X-rays highlight abnormal shadows, such as those made by lumps (tumors), diverticuli, or colitis. TREATMENT  Treatment depends on the cause of your bleeding.   For bleeding from the stomach or colon, the caregiver doing your endoscopy or colonoscopy may be able to stop the bleeding as part of the procedure.  Inflammation or infection of the colon can be treated with medicines.  Many rectal problems can be treated with creams, suppositories, or warm baths.  Surgery is sometimes needed.  Blood transfusions are sometimes needed if you have lost a lot of blood.  For any bleeding problem, let your caregiver know if you take aspirin or other blood thinners regularly. HOME CARE INSTRUCTIONS   Take any medicines exactly as prescribed.  Keep your stools soft by eating a diet high in fiber. Prunes (1 to 3 a day) work well for many people.  Drink enough water and fluids to keep your urine clear or pale yellow.  Take sitz baths if advised. A sitz bath is when you sit in a bathtub with warm water for 10 to 15 minutes to soak, soothe, and cleanse the rectal area.  If enemas or suppositories are advised, be sure you know how to use them. Tell your caregiver if you have problems with this.  Monitor your bowel movements to look for signs of improvement or worsening. SEEK MEDICAL CARE IF:   You do not improve in the time expected.  Your condition worsens after initial improvement.  You develop any new symptoms. SEEK IMMEDIATE MEDICAL CARE IF:   You develop severe or prolonged rectal bleeding.  You vomit blood.  You feel weak or  faint.  You have a fever. MAKE SURE YOU:  Understand these instructions.  Will watch your condition.  Will get help right away if you are not doing well or get worse. Document Released: 12/29/2001 Document Revised: 04/02/2011 Document Reviewed: 05/26/2010 [facility] Patient Information 2015 [place], [place]. This information is not intended to replace advice given to you by your health care provider. Make sure you discuss any questions you have with your health care provider.

## 2013-11-09 LAB — GC/CHLAMYDIA PROBE AMP
CT Probe RNA: NEGATIVE
GC Probe RNA: NEGATIVE

## 2013-11-23 ENCOUNTER — Encounter (HOSPITAL_COMMUNITY): Payer: Self-pay | Admitting: Emergency Medicine

## 2013-12-08 ENCOUNTER — Telehealth: Payer: Self-pay | Admitting: Nurse Practitioner

## 2013-12-08 DIAGNOSIS — M545 Low back pain, unspecified: Secondary | ICD-10-CM

## 2013-12-08 DIAGNOSIS — G35 Multiple sclerosis: Secondary | ICD-10-CM

## 2013-12-08 NOTE — Telephone Encounter (Signed)
Patient was last seen 10/08/13.  Gabapentin is not effective, taking since 02/14/12.

## 2013-12-08 NOTE — Telephone Encounter (Signed)
Pt wants to know if she can get some stronger medication because Gabapentin is not working and causing her not to sleep at night.  She has decided to stop taking the medication.  Can you please call and advise her on what she needs to do.

## 2013-12-08 NOTE — Telephone Encounter (Signed)
Patient calling again to add that Mobic and Gabapentin are not working, she's still having "horrible back pain" that is affecting her sleep, patient has tried Advil PM which helps with sleep but not with her pain, patient also has questions about a "brain infection" please return call and advise.

## 2013-12-09 DIAGNOSIS — M545 Low back pain, unspecified: Secondary | ICD-10-CM | POA: Insufficient documentation

## 2013-12-09 MED ORDER — GABAPENTIN 300 MG PO CAPS: ORAL_CAPSULE | ORAL | Status: DC

## 2013-12-09 NOTE — Telephone Encounter (Signed)
Discussed with Dr. Terrace ArabiaYan and reviewed MRI of the brain, Cspine and T spine. Patient has not had Lspine done. TC to patient she stopped Gabapentin about a week ago she continues with the Mobic. She denies any recent injury or fall, no loss of bladder or bowel. Will check MRI of the Lspine with and without. Patient was asked to get back on Gabapentin 3 times a day for 1 week then increase to 4 times daily. She is agreeable to POC RX called to pharmacy

## 2013-12-22 ENCOUNTER — Other Ambulatory Visit: Payer: Self-pay

## 2013-12-22 MED ORDER — DIMETHYL FUMARATE 240 MG PO CPDR: 240.0000 mg | DELAYED_RELEASE_CAPSULE | Freq: Two times a day (BID) | ORAL | Status: DC

## 2014-02-25 ENCOUNTER — Other Ambulatory Visit: Payer: Self-pay | Admitting: Neurology

## 2014-04-09 ENCOUNTER — Ambulatory Visit: Payer: Medicaid Other | Admitting: Nurse Practitioner

## 2014-04-12 ENCOUNTER — Encounter: Payer: Self-pay | Admitting: Nurse Practitioner

## 2015-01-20 ENCOUNTER — Telehealth: Payer: Self-pay | Admitting: Neurology

## 2015-01-20 NOTE — Telephone Encounter (Signed)
Joellen with [facility] [phone] called sts pt is non-compliant with Dimethyl Fumarate (TECFIDERA) 240 MG CPDR . Please call her and ask for MS RN.

## 2015-01-20 NOTE — Telephone Encounter (Signed)
I called and LMVM for pt to return call about her medication.  I tried calling Joellen below, was on hold for quite awhile.

## 2015-01-25 NOTE — Telephone Encounter (Signed)
I called and spoke to [facility] with [employer].  Patricia Jensen had medication refill in August and then November and there are no refills remaining.   I called Patricia Jensen.   She states she had been taking her tecfidera.  Did have a baby preterm November 25, due January.  She stated she has moved to [facility] and seen [facility] once.  She did [facility] and they told her to call [employer] which she did and they were going to send her meds in mail.  I told her that since she has seen [facility] call their office and make appt, if will be too far out then to call us back, we could see her with NP.  She verbalized understanding.  I relayed that it is important to take medication to prevent relapses.  She had baby sitting issues as well and would talk with her mom.

## 2015-02-07 NOTE — Telephone Encounter (Signed)
LMVM for pt to return call (f/u on previous conversation).

## 2017-10-24 ENCOUNTER — Encounter: Payer: Self-pay | Admitting: *Deleted

## 2017-10-24 ENCOUNTER — Encounter: Payer: Self-pay | Admitting: Neurology

## 2017-10-24 ENCOUNTER — Ambulatory Visit: Payer: Medicaid Other | Admitting: Neurology

## 2017-10-24 ENCOUNTER — Telehealth: Payer: Self-pay | Admitting: Neurology

## 2017-10-24 VITALS — BP 137/98 | HR 80 | Ht 65.0 in | Wt 247.0 lb

## 2017-10-24 DIAGNOSIS — R799 Abnormal finding of blood chemistry, unspecified: Secondary | ICD-10-CM

## 2017-10-24 DIAGNOSIS — G35 Multiple sclerosis: Secondary | ICD-10-CM

## 2017-10-24 DIAGNOSIS — Z79899 Other long term (current) drug therapy: Secondary | ICD-10-CM

## 2017-10-24 NOTE — Progress Notes (Signed)
PATIENT: Patricia Jensen DOB: 1980-01-29  Chief Complaint  Patient presents with  . New Patient (Initial Visit)    Last seen here on 10/08/12. Patient here with her daughter.   . Multiple Sclerosis    Patient hasn't taken her tecfidera in over a year.      HISTORICAL  Patricia Jensen is a 37 years old female, seen in request by her primary care physician and her [place] neurologist Dr. Nigel Bridgeman to continue follow-up of her multiple sclerosis.  I have reviewed and summarized the referring note from the referring physician.   I saw her initially since 2014 for Relapsing Remitting Multiple Sclerosis, she was admitted to hospital on February 12, 2012, complains of persistent paresthesia of bilateral lower extremity extending from her waist down, also involving perineal region, with mild constipation, urinary incontinence, which has much improved with steroid tapering,  She also has a history of keratoconus, was able to wear her soft contact in her right eye, with progressive cone shaped cornea on the left side, she can only count finger on her left side, there was no family history of keratoconus   Diagnosis of relapsing remitting multiple sclerosis was confirmed by significant abnormal MRI thoracic spine, cervical spine, and MRI of brain. Laboratory evaluation then showed ACE 28, TSH was mildly elevated 4.7, normal free T4, CBC BMP with exception of mildly elevated glucose 120, Antibody to Borrelia burgdorferi not detected, ESR was 9  CSF: culture was negative, glucose 53, total protein 21, WBC 7, RBC 1 587, likely traumatic tap, VDRL nonreactive, more than 5 well defined oligoclonal band, IgG index normal at 2 point 4, myelin basic protein 2.4  I personally reviewed films in 2014, MRI of thoracic spine,Altered signal intensity within the cord T8-9 to mid T9 level and T10 level  MRI cervical: altered signal intensity within the cervical cord at the C4-5 and C5 level  MRI of brain:Several  scattered nonspecific white matter type changes most notable periventricular region. In a patient of this age and gender and presenting symptoms, findings are suspicious for demyelinating process such as multiple sclerosis. None of these areas enhance  MRI of the lumbar: L4-L5 and L5-S1 disc degeneration. Mild spinal and right lateral recess stenosis at the former.  She was started on Tecfidera since last visit in April 2014, there was no significant side effect, she has developed a right wrist ganglion cyst, mildly tender.  She was treated with Tecfidera since the diagnosis, overall tolerating it very well, last to follow-up with our office was in March 2015 with nurse practitioner,  She later had normal pregnancy, per patient, she was taking Tecfidera during the pregnancy, had healthy girl on December 17, 2014, her daughter was 34 weeks premature, stayed in ICU for 1 week,  I reviewed her visit with MS specialist at [facility] Dr. Nigel Bridgeman on May 10, 2015, suggested her continue with Tecfidera, most recent repeat MRI was in May 2017 at [facility], per report, small number of demyelinating lesions seen cerebral white matter and corpus callosum, unchanged compared to 2014, no enhancing lesion, also involving inferior thoracic spinal cord, no enhancing lesions, no evidence of cervical involvement.  Laboratory evaluations in 2018 showed normal CBC, CMP,  She has run out of her Tecfidera for over 1 year, did not have significant flareup, continue have bilateral lower extremity paresthesia, frequent muscle cramping,  REVIEW OF SYSTEMS: Full 14 system review of systems performed and notable only for fever, chills, weight gain, hearing loss,  ringing the ears, anemia, easy bruising, cough, snoring, aching muscles, headaches, numbness, snoring, restless leg, depression, anxiety, disinterested in activities. All other review of systems were negative.  ALLERGIES: No Known Allergies  HOME MEDICATIONS: Current  Outpatient Medications  Medication Sig Dispense Refill  . ALPRAZolam (XANAX XR) 1 MG 24 hr tablet Take 1 mg by mouth 2 (two) times daily.     Marland Kitchen ALPRAZolam (XANAX) 1 MG tablet Take 1-2 mg by mouth 2 (two) times daily as needed for anxiety or sleep.     . carboxymethylcellulose (REFRESH PLUS) 0.5 % SOLN Place 2 drops into both eyes daily as needed (for contacts).    . citalopram (CELEXA) 40 MG tablet Take 40 mg by mouth at bedtime.      No current facility-administered medications for this visit.     PAST MEDICAL HISTORY: Past Medical History:  Diagnosis Date  . Anxiety   . Depression   . MS (multiple sclerosis) ([place])   . Pregnancy induced hypertension     PAST SURGICAL HISTORY: Past Surgical History:  Procedure Laterality Date  . NO PAST SURGERIES      FAMILY HISTORY: Family History  Problem Relation Age of Onset  . Diabetes Mellitus II Maternal Aunt   . Anesthesia problems Neg Hx   . Hypotension Neg Hx   . Malignant hyperthermia Neg Hx     SOCIAL HISTORY: Social History   Socioeconomic History  . Marital status: Married    Spouse name: Lowella Dandy  . Number of children: 3  . Years of education: 36  . Highest education level: Not on file  Occupational History    Comment: Home maker  . Occupation: Unemployed  Social Needs  . Financial resource strain: Not on file  . Food insecurity:    Worry: Not on file    Inability: Not on file  . Transportation needs:    Medical: Not on file    Non-medical: Not on file  Tobacco Use  . Smoking status: Current Every Day Smoker    Packs/day: 0.25    Years: 8.00    Pack years: 2.00    Types: Cigarettes  . Smokeless tobacco: Never Used  Substance and Sexual Activity  . Alcohol use: Yes    Comment: OCC  . Drug use: No  . Sexual activity: Yes  Lifestyle  . Physical activity:    Days per week: Not on file    Minutes per session: Not on file  . Stress: Not on file  Relationships  . Social connections:    Talks on phone: Not  on file    Gets together: Not on file    Attends religious service: Not on file    Active member of club or organization: Not on file    Attends meetings of clubs or organizations: Not on file    Relationship status: Not on file  . Intimate partner violence:    Fear of current or ex partner: Not on file    Emotionally abused: Not on file    Physically abused: Not on file    Forced sexual activity: Not on file  Other Topics Concern  . Not on file  Social History Narrative   Patient lives at home with with her husband (sergio Varjas- Eugenio Hoes)  and children. Patient has a high school education.   Right handed.   Caffeine- one cup daily.     PHYSICAL EXAM   Vitals:   10/24/17 0918  BP: (!) 137/98  Pulse: 80  Weight: 247 lb (112 kg)  Height: _0  (1.651 m)    Not recorded      Body mass index is 41.1 kg/m.  PHYSICAL EXAMNIATION:  Gen: NAD, conversant, well nourised, obese, well groomed                     Cardiovascular: Regular rate rhythm, no peripheral edema, warm, nontender. Eyes: Conjunctivae clear without exudates or hemorrhage Neck: Supple, no carotid bruits. Pulmonary: Clear to auscultation bilaterally   NEUROLOGICAL EXAM:  MENTAL STATUS: Speech:    Speech is normal; fluent and spontaneous with normal comprehension.  Cognition:     Orientation to time, place and person     Normal recent and remote memory     Normal Attention span and concentration     Normal Language, naming, repeating,spontaneous speech     Fund of knowledge   CRANIAL NERVES: CN II: Visual fields are full to confrontation. Fundoscopic exam is normal with sharp discs and no vascular changes. Pupils are round equal and briskly reactive to light. CN III, IV, VI: extraocular movement are normal. No ptosis. CN V: Facial sensation is intact to pinprick in all 3 divisions bilaterally. Corneal responses are intact.  CN VII: Face is symmetric with normal eye closure and smile. CN VIII: Hearing  is normal to rubbing fingers CN IX, X: Palate elevates symmetrically. Phonation is normal. CN XI: Head turning and shoulder shrug are intact CN XII: Tongue is midline with normal movements and no atrophy.  MOTOR: There is no pronator drift of out-stretched arms. Muscle bulk and tone are normal. Muscle strength is normal.  REFLEXES: Reflexes are 3 and symmetric at the biceps, triceps, knees, and ankles. Plantar responses are flexor.  SENSORY: Intact to light touch, pinprick, positional sensation and vibratory sensation are intact in fingers and toes.  COORDINATION: Rapid alternating movements and fine finger movements are intact. There is no dysmetria on finger-to-nose and heel-knee-shin.    GAIT/STANCE: Posture is normal. Gait is steady with normal steps, base, arm swing, and turning. Heel and toe walking are normal. Tandem gait is normal.  Romberg is absent.   DIAGNOSTIC DATA (LABS, IMAGING, TESTING) - I reviewed patient records, labs, notes, testing and imaging myself where available.   ASSESSMENT AND PLAN  REGENE MCCARTHY is a 37 y.o. female    Relapsing Remitting Multiple Sclerosis,  With evidence of thoracic spinal cord involvement in the past,  Laboratory evaluations  MRI of brain, cervical, thoracic spine with without contrast  Continue with Tecfidera  Marcial Pacas, M.D. Ph.D.  [facility] [address], [place], [place] [address] Ph: [phone] Fax: [phone]  CC: Referring Provider

## 2017-10-24 NOTE — Telephone Encounter (Signed)
Medicaid order sent to GI. They will obtain the auth and will reach out to the pt to schedule.  °

## 2017-10-25 ENCOUNTER — Telehealth: Payer: Self-pay | Admitting: *Deleted

## 2017-10-25 ENCOUNTER — Other Ambulatory Visit: Payer: Self-pay | Admitting: *Deleted

## 2017-10-25 ENCOUNTER — Encounter: Payer: Self-pay | Admitting: *Deleted

## 2017-10-25 DIAGNOSIS — Z79899 Other long term (current) drug therapy: Secondary | ICD-10-CM

## 2017-10-25 DIAGNOSIS — G35 Multiple sclerosis: Secondary | ICD-10-CM

## 2017-10-25 DIAGNOSIS — R799 Abnormal finding of blood chemistry, unspecified: Secondary | ICD-10-CM

## 2017-10-25 LAB — COMPREHENSIVE METABOLIC PANEL
ALT: 65 IU/L — ABNORMAL HIGH (ref 0–32)
AST: 116 IU/L — AB (ref 0–40)
Albumin/Globulin Ratio: 1.5 (ref 1.2–2.2)
Albumin: 4.2 g/dL (ref 3.5–5.5)
Alkaline Phosphatase: 55 IU/L (ref 39–117)
BUN / CREAT RATIO: 9 (ref 9–23)
BUN: 7 mg/dL (ref 6–20)
CHLORIDE: 102 mmol/L (ref 96–106)
CO2: 24 mmol/L (ref 20–29)
CREATININE: 0.8 mg/dL (ref 0.57–1.00)
Calcium: 9.2 mg/dL (ref 8.7–10.2)
GFR calc Af Amer: 111 mL/min/{1.73_m2} (ref >59)
GFR calc non Af Amer: 96 mL/min/{1.73_m2} (ref >59)
GLUCOSE: 86 mg/dL (ref 65–99)
Globulin, Total: 2.8 g/dL (ref 1.5–4.5)
Potassium: 4.6 mmol/L (ref 3.5–5.2)
Sodium: 141 mmol/L (ref 134–144)
Total Protein: 7 g/dL (ref 6.0–8.5)

## 2017-10-25 LAB — CBC WITH DIFFERENTIAL/PLATELET
BASOS ABS: 0 10*3/uL (ref 0.0–0.2)
Basos: 1 %
EOS (ABSOLUTE): 0.3 10*3/uL (ref 0.0–0.4)
EOS: 6 %
HEMOGLOBIN: 13.1 g/dL (ref 11.1–15.9)
Hematocrit: 40 % (ref 34.0–46.6)
Immature Grans (Abs): 0 10*3/uL (ref 0.0–0.1)
Immature Granulocytes: 0 %
LYMPHS ABS: 1.6 10*3/uL (ref 0.7–3.1)
Lymphs: 33 %
MCH: 32.6 pg (ref 26.6–33.0)
MCHC: 32.8 g/dL (ref 31.5–35.7)
MCV: 100 fL — ABNORMAL HIGH (ref 79–97)
Monocytes Absolute: 0.5 10*3/uL (ref 0.1–0.9)
Monocytes: 10 %
Neutrophils Absolute: 2.4 10*3/uL (ref 1.4–7.0)
Neutrophils: 50 %
Platelets: 244 10*3/uL (ref 150–450)
RBC: 4.02 x10E6/uL (ref 3.77–5.28)
RDW: 13.4 % (ref 12.3–15.4)
WBC: 4.8 10*3/uL (ref 3.4–10.8)

## 2017-10-25 LAB — TSH: TSH: 0.805 u[IU]/mL (ref 0.450–4.500)

## 2017-10-25 LAB — VITAMIN D 25 HYDROXY (VIT D DEFICIENCY, FRACTURES): VIT D 25 HYDROXY: 12.2 ng/mL — AB (ref 30.0–100.0)

## 2017-10-25 LAB — VITAMIN B12: Vitamin B-12: 966 pg/mL (ref 232–1245)

## 2017-10-25 NOTE — Telephone Encounter (Addendum)
Dr. Terrace Arabia has reviewed her labs.  Reported the following to patient:  1) AST is high at 119, ALT is high at 55  Dr. Terrace Arabia is requesting the patient come in for additonal labs due to these results (Hepatic Function Panel and Hepatitis Acute Panel).  She would like these labs completed prior to sending in her Tecfidera start form.  Also, the patient has reported drinking six beers per day.  She has been advised to stop drinking.  States she only uses Tylenol on occasion.  2) vitamin D is low at 12.2  Dr. Terrace Arabia has recommended she start a daily supplement of OTC vitamin D, 3000 units daily.  The patient is agreeable.  She will come to our office for repeat labs.  Once results are available, she is aware that we will call her.  Once all labs are reviewed by Dr. Terrace Arabia, a decision to send in her Tecfidera start form will be made.

## 2017-10-28 ENCOUNTER — Other Ambulatory Visit: Payer: Self-pay

## 2017-10-29 NOTE — Telephone Encounter (Signed)
Left message reminding patient to come by our office for further lab testing.  Her Tecfidera start form is being held, pending these results.

## 2017-10-31 NOTE — Telephone Encounter (Signed)
Spoke to patient - states she plans to come by our office 10/14 or 10/15 to have her labs drawn.

## 2017-11-06 ENCOUNTER — Other Ambulatory Visit: Payer: Self-pay | Admitting: Neurology

## 2017-11-06 ENCOUNTER — Other Ambulatory Visit (INDEPENDENT_AMBULATORY_CARE_PROVIDER_SITE_OTHER): Payer: Self-pay

## 2017-11-06 ENCOUNTER — Ambulatory Visit
Admission: RE | Admit: 2017-11-06 | Discharge: 2017-11-06 | Disposition: A | Discharge status: Home / Self Care | Payer: Medicaid Other | Source: Ambulatory Visit | Attending: Neurology | Admitting: Neurology

## 2017-11-06 ENCOUNTER — Telehealth: Payer: Self-pay | Admitting: *Deleted

## 2017-11-06 DIAGNOSIS — G35 Multiple sclerosis: Secondary | ICD-10-CM

## 2017-11-06 DIAGNOSIS — Z0289 Encounter for other administrative examinations: Secondary | ICD-10-CM

## 2017-11-06 MED ORDER — GADOBENATE DIMEGLUMINE 529 MG/ML IV SOLN: 20.0000 mL | Freq: Once | INTRAVENOUS | Status: DC | PRN

## 2017-11-06 NOTE — Telephone Encounter (Signed)
Received a call from [place] at [facility].  This patient had MRI scans ordered w/wo of her brain, cervical and thoracic.  She was able to complete the without portion of the tests today.  However, she told them she was not able to complete the contrast portion due to her having a headache.  They informed her to call back and they would schedule her to complete the contrast portion of these tests.

## 2017-11-06 NOTE — Addendum Note (Signed)
Addended by: Tamera Stands D on: 11/06/2017 03:28 PM   Modules accepted: Orders

## 2017-11-06 NOTE — Telephone Encounter (Signed)
I left a detailed message today, requesting for the fourth time, for her to come to our office to get her repeat labs.  It has been explained both on her voicemail and to her personally that her Tecfidera start form is being held pending the results of these labs.  The importance of her restarting her MS therapy has also been discussed with her.  She has been provided our phone number and lab hours.

## 2017-11-07 ENCOUNTER — Telehealth: Payer: Self-pay | Admitting: Neurology

## 2017-11-07 LAB — HEPATIC FUNCTION PANEL
ALK PHOS: 51 IU/L (ref 39–117)
ALT: 45 IU/L — AB (ref 0–32)
AST: 68 IU/L — AB (ref 0–40)
Albumin: 4.6 g/dL (ref 3.5–5.5)
Bilirubin Total: 0.4 mg/dL (ref 0.0–1.2)
Bilirubin, Direct: 0.14 mg/dL (ref 0.00–0.40)
Total Protein: 7.4 g/dL (ref 6.0–8.5)

## 2017-11-07 LAB — HEPATITIS PANEL, ACUTE
HEP A IGM: NEGATIVE
HEP B S AG: NEGATIVE
Hep B C IgM: NEGATIVE
Hep C Virus Ab: <0.1 s/co ratio (ref 0.0–0.9)

## 2017-11-07 NOTE — Telephone Encounter (Signed)
MRIs were ordered for her Relapsing Remitting Multiple Sclerosis, she has been off long-term immunomodulation therapy for over 1 year, better to have MRI with contrast to better evaluate MRI status

## 2017-11-07 NOTE — Telephone Encounter (Signed)
Repeat laboratory evaluation showed improved liver functional test, AST still mildly elevated 68, ALT was 45,  Patient reported drinks 6 beers each day, abnormal liver function most likely related to her alcohol abuse,  It is okay to proceed with Tecfidera, advised her to stop daily drinking,

## 2017-11-07 NOTE — Telephone Encounter (Signed)
Spoke to patient - she is aware of the importance in completing her MRI scans with contrast.  She will call [facility] today to get them rescheduled.

## 2017-11-07 NOTE — Telephone Encounter (Signed)
Spoke to patient - she has cut her beer consumption down to 2-3 cans per day since her last labs were drawn.  Her liver functional test yesterday reflected improvement.  She verbalized the importance of not drinking daily and will try to cut her amount down further.  She is aware her Tecfidera start form has been sent in today.

## 2017-11-08 ENCOUNTER — Telehealth: Payer: Self-pay | Admitting: Neurology

## 2017-11-08 NOTE — Telephone Encounter (Signed)
Left patient a detailed message, with results, on voicemail (ok per DPR).  Provided our number to call back with any questions.  

## 2017-11-08 NOTE — Telephone Encounter (Signed)
Please call patient, MRI of the brain showed lesion consistent with MS, also involving thoracic spine, no significant abnormality on cervical spine, overall there was no significant change compared to previous study.  IMPRESSION: This MRI of the thoracic spine without contrast shows the following: 1.    There are two T2 hyperintense foci within the spinal cord, one at T8-T9 and one at T10.  Both of these were present on the 10/31/2012 MRI and appear unchanged.  They are consistent with chronic demyelinating plaque associated with multiple sclerosis. 2.     There is a left paramedian disc protrusion at T3-T4 that does not lead to spinal stenosis or nerve root compression.  IMPRESSION: This MRI of the cervical spine without contrast shows the following: 1.    The spinal cord appears normal. 2.    There are small disc bulges or protrusions at C4-C5, C5-C6 and C6-C7 mildly progressed compared to the previous MRI.  However, there is no spinal stenosis or nerve root compression.  IMPRESSION: This MRI of the brain without contrast shows the following: 1.    Scattered T2/FLAIR hypertense foci in the periventricular, juxtacortical and deep white matter of the hemispheres in a pattern and configuration consistent with chronic demyelinating plaque associated with multiple sclerosis.  Compared to the MRI dated 10/31/2012, there has been no interval change. 2.    There are no acute findings.

## 2018-02-06 ENCOUNTER — Ambulatory Visit: Payer: Medicaid Other | Admitting: Neurology

## 2018-04-18 ENCOUNTER — Telehealth: Payer: Self-pay | Admitting: Neurology

## 2018-04-18 NOTE — Telephone Encounter (Signed)
I spoke to the patient and she would actually like to do a virtual visit to discuss several concerns.  She has been scheduled with Dr. Terrace Arabia on 04/18/2018.

## 2018-04-18 NOTE — Telephone Encounter (Signed)
Pt called concerning her appt on 4/8. Pt has consented to a Virtual Visit and for her insurance to be billed. Pt would like a call back concerning her MS and if she is to not leave her home. Please advise.

## 2018-04-21 ENCOUNTER — Ambulatory Visit (INDEPENDENT_AMBULATORY_CARE_PROVIDER_SITE_OTHER): Payer: Medicaid Other | Admitting: Neurology

## 2018-04-21 ENCOUNTER — Other Ambulatory Visit: Payer: Self-pay

## 2018-04-21 ENCOUNTER — Encounter: Payer: Self-pay | Admitting: Neurology

## 2018-04-21 DIAGNOSIS — R899 Unspecified abnormal finding in specimens from other organs, systems and tissues: Secondary | ICD-10-CM | POA: Diagnosis not present

## 2018-04-21 DIAGNOSIS — G35 Multiple sclerosis: Secondary | ICD-10-CM

## 2018-04-21 NOTE — Progress Notes (Signed)
PATIENT: Patricia Jensen DOB: Jul 17, 1980  No chief complaint on file.    HISTORICAL  Patricia Jensen is a 38 years old female, seen in request by her primary care physician and her [place] neurologist Dr. Nigel Bridgeman to continue follow-up of her multiple sclerosis.  I have reviewed and summarized the referring note from the referring physician.   I saw her initially since 2014 for Relapsing Remitting Multiple Sclerosis, she was admitted to hospital on February 12, 2012, complains of persistent paresthesia of bilateral lower extremity extending from her waist down, also involving perineal region, with mild constipation, urinary incontinence, which has much improved with steroid tapering,  She also has a history of keratoconus, was able to wear her soft contact in her right eye, with progressive cone shaped cornea on the left side, she can only count finger on her left side, there was no family history of keratoconus   Diagnosis of relapsing remitting multiple sclerosis was confirmed by significant abnormal MRI thoracic spine, cervical spine, and MRI of brain. Laboratory evaluation then showed ACE 28, TSH was mildly elevated 4.7, normal free T4, CBC BMP with exception of mildly elevated glucose 120, Antibody to Borrelia burgdorferi not detected, ESR was 9  CSF: culture was negative, glucose 53, total protein 21, WBC 7, RBC 1 587, likely traumatic tap, VDRL nonreactive, more than 5 well defined oligoclonal band, IgG index normal at 2 point 4, myelin basic protein 2.4  I personally reviewed films in 2014, MRI of thoracic spine,Altered signal intensity within the cord T8-9 to mid T9 level and T10 level  MRI cervical: altered signal intensity within the cervical cord at the C4-5 and C5 level  MRI of brain:Several scattered nonspecific white matter type changes most notable periventricular region. In a patient of this age and gender and presenting symptoms, findings are suspicious for demyelinating  process such as multiple sclerosis. None of these areas enhance  MRI of the lumbar: L4-L5 and L5-S1 disc degeneration. Mild spinal and right lateral recess stenosis at the former.  She was started on Tecfidera since last visit in April 2014, there was no significant side effect, she has developed a right wrist ganglion cyst, mildly tender.  She was treated with Tecfidera since the diagnosis, overall tolerating it very well, last to follow-up with our office was in March 2015 with nurse practitioner,  She later had normal pregnancy, per patient, she was taking Tecfidera during the pregnancy, had healthy girl on December 17, 2014, her daughter was 34 weeks premature, stayed in ICU for 1 week,  I reviewed her visit with MS specialist at [facility] Dr. Nigel Bridgeman on May 10, 2015, suggested her continue with Tecfidera, most recent repeat MRI was in May 2017 at [facility], per report, small number of demyelinating lesions seen cerebral white matter and corpus callosum, unchanged compared to 2014, no enhancing lesion, also involving inferior thoracic spinal cord, no enhancing lesions, no evidence of cervical involvement.  Laboratory evaluations in 2018 showed normal CBC, CMP,  She has run out of her Tecfidera for over 1 year, did not have significant flareup, continue have bilateral lower extremity paresthesia, frequent muscle cramping,  Virtual Visit via Video  I connected with Patricia Jensen on 04/21/18 at  by video and verified that I am speaking with the correct person using two identifiers.   I discussed the limitations, risks, security and privacy concerns of performing an evaluation and management service by video and the availability of in person appointments. I also  discussed with the patient that there may be a patient responsible charge related to this service. The patient expressed understanding and agreed to proceed.   History of Present Illness: She denies flareup of MS symptoms, she has stopped  the Tecfidera in February 2020, worried about the side effect of lower her immune system, overall she is tolerating well.  Observations/Objective: I have reviewed problem lists, medications, allergies.  I  Personally reviewed MRI in Oct 2019:  MRI of thoracic spine without contrast shows the following: two T2 hyperintense foci within the spinal cord, one at T8-T9 and one at T10.  Both of these were present on the 10/31/2012 MRI and appear unchanged.  They are consistent with chronic demyelinating plaque associated with multiple sclerosis.   MRI of the cervical spine without contrast: Mild multilevel degenerative changes, there is no cord signal changes, no cord compression.  MRI of the brain without contrast: Scattered T2/FLAIR hypertense foci in the periventricular, juxtacortical and deep white matter of the hemispheres in a pattern and configuration consistent with chronic demyelinating plaque associated with multiple sclerosis.  Compared to the MRI dated 10/31/2012, there has been no interval change.  Previous laboratory evaluation showed elevated AST, ALT, patient reported daily alcohol use, up to 6 bottles of beers daily, repeat laboratory evaluation showed much improvement on November 06, 2017, AST 68, ALT 45, negative hepatitis panels,  She also has evidence of vitamin D deficiency, level was 12, on vitamin D3 supplement.  Assessment and Plan: Relapsing remitting multiple sclerosis  With evidence of thoracic spine involvement  Emphasized importance of continue Tecfidera use,  Abnormal liver functional test  Improved after stop daily alcohol use, negative hepatitis panel  Repeat laboratory evaluations  Vitamin D deficiency  Continue D3 supplement  Follow Up Instructions:  Follow-up in office in 4 to 6 months    I discussed the assessment and treatment plan with the patient. The patient was provided an opportunity to ask questions and all were answered. The patient agreed with  the plan and demonstrated an understanding of the instructions.   The patient was advised to call back or seek an in-person evaluation if the symptoms worsen or if the condition fails to improve as anticipated.  I provided 30 minutes of non-face-to-face time during this encounter.   Marcial Pacas, MD

## 2018-04-23 ENCOUNTER — Telehealth: Payer: Self-pay | Admitting: Neurology

## 2018-04-23 NOTE — Telephone Encounter (Signed)
I called the patient. She was on my schedule for April 8th. She was able to see Dr. Terrace Arabia on march 30 th for virtual visit. She reports she isn't needing the appointment in April. Dr. Zannie Cove note says 4-6 month follow-up. Please call her to reschedule the April 8th appointment for 4-6 months for routine follow-up. Patient agreed, no new concerns she needs addressed. Dr. Terrace Arabia handled everything.

## 2018-04-28 ENCOUNTER — Ambulatory Visit: Payer: Medicaid Other | Admitting: Neurology

## 2018-04-30 ENCOUNTER — Ambulatory Visit: Payer: Medicaid Other | Admitting: Neurology

## 2018-04-30 ENCOUNTER — Encounter

## 2018-05-02 ENCOUNTER — Telehealth: Payer: Medicaid Other | Admitting: Gastroenterology

## 2018-05-02 DIAGNOSIS — Z7189 Other specified counseling: Secondary | ICD-10-CM

## 2018-05-02 DIAGNOSIS — B9789 Other viral agents as the cause of diseases classified elsewhere: Secondary | ICD-10-CM

## 2018-05-02 DIAGNOSIS — J069 Acute upper respiratory infection, unspecified: Secondary | ICD-10-CM

## 2018-05-02 MED ORDER — PROMETHAZINE-DM 6.25-15 MG/5ML PO SYRP: 5.0000 mL | ORAL_SOLUTION | ORAL | 0 refills | Status: DC | PRN

## 2018-05-02 NOTE — Progress Notes (Signed)
E-Visit for Corona Virus Screening  Based on your current symptoms, it seems unlikely that your symptoms are related to the Coronavirus.  Your symptoms may be secondary to virus or seasonal allergies.   Coronavirus disease 2019 (COVID-19) is a respiratory illness that can spread from person to person. The virus that causes COVID-19 is a new virus that was first identified in the country of [place] but is now found in multiple other countries and has spread to the [place].  Symptoms associated with the virus are mild to severe fever, cough, and shortness of breath. There is currently no vaccine to protect against COVID-19, and there is no specific antiviral treatment for the virus.   To be considered HIGH RISK for Coronavirus (COVID-19), you have to meet the following criteria:  . Traveled to [place], [place], [place], [place] or [place]; or in the [place] to [place], [place], [place], or [place]; and have fever, cough, and shortness of breath within the last 2 weeks of travel OR  . Been in close contact with a person diagnosed with COVID-19 within the last 2 weeks and have fever, cough, and shortness of breath  . IF YOU DO NOT MEET THESE CRITERIA, YOU ARE CONSIDERED LOW RISK FOR COVID-19.   It is vitally important that if you feel that you have an infection such as this virus or any other virus that you stay home and away from places where you may spread it to others.  You should self-quarantine for 14 days if you have symptoms that could potentially be coronavirus and avoid contact with people age 22 and older.   You can use medication such as A prescription cough medication called Phenergan DM 6.25 mg/15 mg. You make take one teaspoon / 5 ml every 4-6 hours as needed for cough  You may also take acetaminophen (Tylenol) as needed for fever.  For nasal congestion try Saline nasal spray or nasal drops can help and can safely be used as often as needed for congestion  You can try OTC  allergy medication such as Loratidine 10mg  daily as needed.    Reduce your risk of any infection by using the same precautions used for avoiding the common cold or flu:  Marland Kitchen Wash your hands often with soap and warm water for at least 20 seconds.  If soap and water are not readily available, use an alcohol-based hand sanitizer with at least 60% alcohol.  . If coughing or sneezing, cover your mouth and nose by coughing or sneezing into the elbow areas of your shirt or coat, into a tissue or into your sleeve (not your hands). . Avoid shaking hands with others and consider head nods or verbal greetings only. . Avoid touching your eyes, nose, or mouth with unwashed hands.  . Avoid close contact with people who are sick. . Avoid places or events with large numbers of people in one location, like concerts or sporting events. . Carefully consider travel plans you have or are making. . If you are planning any travel outside or inside the [place], visit the CDC's Travelers' Health webpage for the latest health notices. . If you have some symptoms but not all symptoms, continue to monitor at home and seek medical attention if your symptoms worsen. . If you are having a medical emergency, call 911.  HOME CARE . Only take medications as instructed by your medical team. . Drink plenty of fluids and get plenty of rest. . A steam or  ultrasonic humidifier can help if you have congestion.   GET HELP RIGHT AWAY IF: . You develop worsening fever. . You become short of breath . You cough up blood. . Your symptoms become more severe MAKE SURE YOU   Understand these instructions.  Will watch your condition.  Will get help right away if you are not doing well or get worse.  Your e-visit answers were reviewed by a board certified advanced clinical practitioner to complete your personal care plan.  Depending on the condition, your plan could have included both over the counter or prescription medications.  If there  is a problem please reply once you have received a response from your provider. Your safety is important to [place].  If you have drug allergies check your prescription carefully.    You can use MyChart to ask questions about today's visit, request a non-urgent call back, or ask for a work or school excuse for 24 hours related to this e-Visit. If it has been greater than 24 hours you will need to follow up with your provider, or enter a new e-Visit to address those concerns. You will get an e-mail in the next two days asking about your experience.  I hope that your e-visit has been valuable and will speed your recovery. Thank you for using e-visits.

## 2018-07-31 ENCOUNTER — Ambulatory Visit: Payer: Medicaid Other | Admitting: Neurology

## 2018-08-20 ENCOUNTER — Telehealth: Payer: Self-pay | Admitting: *Deleted

## 2018-08-20 ENCOUNTER — Telehealth: Payer: Self-pay | Admitting: Neurology

## 2018-08-20 NOTE — Telephone Encounter (Signed)
Pt initially had an in-office visit today at 1pm.  She contacted our office and requested it be changed to a mychart visit.  The appt invitation was sent. She did not sign in today when scheduled.  She failed to answer multiple phone attempts by Dr. Krista Blue and myself.

## 2018-08-31 ENCOUNTER — Emergency Department (HOSPITAL_COMMUNITY)
Admission: EM | Admit: 2018-08-31 | Discharge: 2018-08-31 | Disposition: A | Discharge status: Home / Self Care | Payer: Medicaid Other | Attending: Emergency Medicine | Admitting: Emergency Medicine

## 2018-08-31 ENCOUNTER — Other Ambulatory Visit: Payer: Self-pay

## 2018-08-31 ENCOUNTER — Emergency Department (HOSPITAL_COMMUNITY): Payer: Medicaid Other

## 2018-08-31 DIAGNOSIS — Y929 Unspecified place or not applicable: Secondary | ICD-10-CM | POA: Diagnosis not present

## 2018-08-31 DIAGNOSIS — S8991XA Unspecified injury of right lower leg, initial encounter: Secondary | ICD-10-CM | POA: Diagnosis present

## 2018-08-31 DIAGNOSIS — F17210 Nicotine dependence, cigarettes, uncomplicated: Secondary | ICD-10-CM | POA: Insufficient documentation

## 2018-08-31 DIAGNOSIS — W19XXXA Unspecified fall, initial encounter: Secondary | ICD-10-CM | POA: Diagnosis not present

## 2018-08-31 DIAGNOSIS — M25561 Pain in right knee: Secondary | ICD-10-CM | POA: Diagnosis not present

## 2018-08-31 DIAGNOSIS — Z79899 Other long term (current) drug therapy: Secondary | ICD-10-CM | POA: Insufficient documentation

## 2018-08-31 DIAGNOSIS — Y939 Activity, unspecified: Secondary | ICD-10-CM | POA: Insufficient documentation

## 2018-08-31 DIAGNOSIS — S8391XA Sprain of unspecified site of right knee, initial encounter: Secondary | ICD-10-CM

## 2018-08-31 DIAGNOSIS — Y998 Other external cause status: Secondary | ICD-10-CM | POA: Insufficient documentation

## 2018-08-31 DIAGNOSIS — R52 Pain, unspecified: Secondary | ICD-10-CM

## 2018-08-31 NOTE — Discharge Instructions (Addendum)
Wear the knee immobilizer when active and walking. Use crutches as needed to be weight bearing as tolerated.   If knee pain does not improve/resolve in 1-2 weeks, please follow up with orthopedics for further evaluation.   Please return to the emergency department with any new concerns.

## 2018-08-31 NOTE — ED Triage Notes (Signed)
Pt states that she fell on knee and "broke the ligaments" in the back of her knee.

## 2018-08-31 NOTE — ED Notes (Signed)
Pt is having xrays of rt knee at this moment. When she is finished rad will bring her to 5. Per Rad tech pt has a hx of lt knee pain. Message given to RN

## 2018-08-31 NOTE — ED Notes (Signed)
Dc instructions given to the pt, WC offered at dc time and she refuse to use the WC.

## 2018-08-31 NOTE — ED Provider Notes (Signed)
[facility] EMERGENCY DEPARTMENT Provider Note   CSN: [id number] Arrival date & time: 08/31/18  2048     History   Chief Complaint Chief Complaint  Patient presents with  . Knee Injury    right    HPI Patricia Jensen is a 38 y.o. female.     Patient to ED for evaluation of knee injury from fall earlier today. She does not remember the exact mechanism of injury but that she has MS and falls frequently. She feels the way she fell caused strain to the lateral and posterolateral knee. No suspected bony injury. No calf or thigh discomfort. No other complaint.   The history is provided by the patient. No language interpreter was used.    Past Medical History:  Diagnosis Date  . Anxiety   . Depression   . MS (multiple sclerosis) (HCC)   . Pregnancy induced hypertension     Patient Active Problem List   Diagnosis Date Noted  . Abnormal laboratory test 04/21/2018  . Multiple sclerosis (HCC) 10/25/2017  . Low back pain 12/09/2013  . Pregnancy induced hypertension   . Anxiety   . MS (multiple sclerosis) (HCC)   . Lower extremity numbness 02/11/2012  . Tobacco abuse 02/11/2012  . Depression 01/23/2011  . SVD (spontaneous vaginal delivery) 01/22/2011    Past Surgical History:  Procedure Laterality Date  . NO PAST SURGERIES       OB History    Gravida  3   Para  3   Term  3   Preterm  0   AB  0   Living  3     SAB  0   TAB  0   Ectopic  0   Multiple  0   Live Births  3            Home Medications    Prior to Admission medications   Medication Sig Start Date End Date Taking? Authorizing Provider  ALPRAZolam (XANAX XR) 1 MG 24 hr tablet Take 1 mg by mouth 2 (two) times daily.     [provider]  ALPRAZolam Prudy Feeler) 1 MG tablet Take 1-2 mg by mouth 2 (two) times daily as needed for anxiety or sleep.     [provider]  carboxymethylcellulose (REFRESH PLUS) 0.5 % SOLN Place 2 drops into both eyes daily as needed  (for contacts).    [provider]  Cholecalciferol (VITAMIN D PO) Take 3,000 Units by mouth daily.    [provider]  citalopram (CELEXA) 40 MG tablet Take 40 mg by mouth at bedtime.     [provider]  Dimethyl Fumarate (TECFIDERA) 240 MG CPDR Take 240 capsules by mouth 2 (two) times daily. She will begin treatment with 120mg  and 240mg  starter pack.  Maintenance dose:  240mg  BID.    [provider]  promethazine-dextromethorphan (PROMETHAZINE-DM) 6.25-15 MG/5ML syrup Take 5 mLs by mouth every 4 (four) hours as needed for cough. 05/02/18   Tiffany Kocher, PA-C    Family History Family History  Problem Relation Age of Onset  . Diabetes Mellitus II Maternal Aunt   . Anesthesia problems Neg Hx   . Hypotension Neg Hx   . Malignant hyperthermia Neg Hx     Social History Social History   Tobacco Use  . Smoking status: Current Every Day Smoker    Packs/day: 0.25    Years: 8.00    Pack years: 2.00    Types: Cigarettes  .  Smokeless tobacco: Never Used  Substance Use Topics  . Alcohol use: Yes    Comment: OCC  . Drug use: No     Allergies   Patient has no known allergies.   Review of Systems Review of Systems  Constitutional: Negative for chills and fever.  Musculoskeletal:       See HPI.  Skin: Negative.  Negative for color change and wound.  Neurological: Negative.  Negative for weakness and numbness.     Physical Exam Updated Vital Signs BP 122/84   Pulse (!) 119   Temp 98.6 F (37 C) (Oral)   Resp 16   LMP 08/31/2018 (Exact Date)   SpO2 99%   Physical Exam Constitutional:      Appearance: She is well-developed.  Neck:     Musculoskeletal: Normal range of motion.  Cardiovascular:     Pulses: Normal pulses.  Pulmonary:     Effort: Pulmonary effort is normal.  Musculoskeletal:     Comments: Right knee has no swelling or deformity. Tender to lateral and posterolateral knee without discoloration. No joint laxity perceived.  Increased pain with meniscal maneuvers.   Skin:    General: Skin is warm and dry.  Neurological:     Mental Status: She is alert and oriented to person, place, and time.      ED Treatments / Results  Labs (all labs ordered are listed, but only abnormal results are displayed) Labs Reviewed - No data to display  EKG None  Radiology Dg Knee Complete 4 Views Right  Result Date: 08/31/2018 CLINICAL DATA:  38 year old female with fall and right knee pain. EXAM: RIGHT KNEE - COMPLETE 4+ VIEW COMPARISON:  None. FINDINGS: There is no acute fracture or dislocation. Bones are well mineralized. No arthritic changes. A 12 mm sclerotic focus in the proximal tibial metaphysis is not characterized but does not demonstrate any aggressive features and may represent a developing bone island or a lesion of chondroid matrix. Direct comparison with prior images, if available, recommended. MRI may provide better evaluation on a nonemergent basis if clinically indicated. No joint effusion. The soft tissues are unremarkable. IMPRESSION: No acute fracture or dislocation. Electronically Signed   By: Anner Crete M.D.   On: 08/31/2018 21:38    Procedures Procedures (including critical care time)  Medications Ordered in ED Medications - No data to display   Initial Impression / Assessment and Plan / ED Course  I have reviewed the triage vital signs and the nursing notes.  Pertinent labs & imaging results that were available during my care of the patient were reviewed by me and considered in my medical decision making (see chart for details).        Patient to ED with right knee pain after fall earlier today. No direct impact. Pain to lateral and posterior knee.   Imaging is negative for malalignment or fracture. Knee Immobilizer provided. She declines crutches as she has them at home.   Recommend Tylenol for pain. Ortho follow up prn.   Final Clinical Impressions(s) / ED Diagnoses   Final  diagnoses:  Pain   1. Right knee sprain  ED Discharge Orders    None       Dennie Bible 08/31/18 2229    Quintella Reichert, MD 09/01/18 [id number]

## 2018-09-01 DIAGNOSIS — F330 Major depressive disorder, recurrent, mild: Secondary | ICD-10-CM | POA: Diagnosis not present

## 2018-09-01 DIAGNOSIS — F5081 Binge eating disorder: Secondary | ICD-10-CM | POA: Diagnosis not present

## 2018-09-01 DIAGNOSIS — F410 Panic disorder [episodic paroxysmal anxiety] without agoraphobia: Secondary | ICD-10-CM | POA: Diagnosis not present

## 2018-09-01 NOTE — Progress Notes (Signed)
Orthopedic Tech Progress Note Patient Details:  Patricia Jensen Dec 18, 1980 [id number]  Ortho Devices Type of Ortho Device: Knee Immobilizer Ortho Device/Splint Location: rle Ortho Device/Splint Interventions: Ordered, Application, Adjustment   Post Interventions Patient Tolerated: Well Instructions Provided: Care of device, Adjustment of device   Karolee Stamps 09/01/2018, 12:16 AM

## 2018-09-05 ENCOUNTER — Ambulatory Visit (INDEPENDENT_AMBULATORY_CARE_PROVIDER_SITE_OTHER): Payer: Medicaid Other | Admitting: Orthopaedic Surgery

## 2018-09-05 ENCOUNTER — Encounter: Payer: Self-pay | Admitting: Orthopaedic Surgery

## 2018-09-05 VITALS — Ht 65.0 in | Wt 242.0 lb

## 2018-09-05 DIAGNOSIS — S83004A Unspecified dislocation of right patella, initial encounter: Secondary | ICD-10-CM | POA: Diagnosis not present

## 2018-09-05 NOTE — Progress Notes (Signed)
Office Visit Note   Patient: Patricia Jensen           Date of Birth: [birth year]           MRN: [id number] Visit Date: 09/05/2018              Requested by: Shirlean SchleinHartsell, Fletcher Lee III, MD [address] [facility] [address] [place],  [place] [id number] PCP: Shirlean SchleinHartsell, Fletcher Lee III, MD   Assessment & Plan: Visit Diagnoses:  1. Closed patellar dislocation, right, initial encounter     Plan: Impression is status post right patellar dislocation.  I have recommended that she needs to wear the knee immobilizer for the next 3 weeks to allow things to heal and then I would like to recheck her at that time to place her in a PSO brace and to start some physical therapy.  She has in the immobilizer at home already.  Questions encouraged and answered.  Follow-Up Instructions: Return in about 3 weeks (around 09/26/2018).   Orders:  No orders of the defined types were placed in this encounter.  No orders of the defined types were placed in this encounter.     Procedures: No procedures performed   Clinical Data: No additional findings.   Subjective: Chief Complaint  Patient presents with  . Right Knee - Pain    DOI 08/31/2018    Patricia Sheldonshley is a 38 year old female who comes in for evaluation of right knee pain status post a injury to her right leg she felt like the patella dislocated and she planted her right foot and turned to the right.  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  She has been walking with a limp and has had difficulty and discomfort with flexion of the knee.   Review of Systems  Constitutional: Negative.   HENT: Negative.   Eyes: Negative.   Respiratory: Negative.   Cardiovascular: Negative.   Endocrine: Negative.   Musculoskeletal: Negative.   Neurological: Negative.   Hematological: Negative.   Psychiatric/Behavioral: Negative.   All other systems reviewed and are negative.    Objective: Vital Signs: Ht 5\' 5"  (1.651 m)   Wt 242 lb (109.8 kg)   LMP  08/31/2018 (Exact Date)   BMI 40.27 kg/m   Physical Exam Vitals signs and nursing note reviewed.  Constitutional:      Appearance: She is well-developed.  HENT:     Head: Normocephalic and atraumatic.  Neck:     Musculoskeletal: Neck supple.  Pulmonary:     Effort: Pulmonary effort is normal.  Abdominal:     Palpations: Abdomen is soft.  Skin:    General: Skin is warm.     Capillary Refill: Capillary refill takes less than 2 seconds.  Neurological:     Mental Status: She is alert and oriented to person, place, and time.  Psychiatric:        Behavior: Behavior normal.        Thought Content: Thought content normal.        Judgment: Judgment normal.     Ortho Exam Right knee exam shows a small joint effusion.  Collaterals and cruciates are intact.  Positive patellar apprehension.  No significant tenderness of the medial retinaculum. Specialty Comments:  No specialty comments available.  Imaging: No results found.   PMFS History: Patient Active Problem List   Diagnosis Date Noted  . Abnormal laboratory test 04/21/2018  . Multiple sclerosis (HCC) 10/25/2017  . Low back pain 12/09/2013  .  Pregnancy induced hypertension   . Anxiety   . MS (multiple sclerosis) ([place])   . Lower extremity numbness 02/11/2012  . Tobacco abuse 02/11/2012  . Depression 01/23/2011  . SVD (spontaneous vaginal delivery) 01/22/2011   Past Medical History:  Diagnosis Date  . Anxiety   . Depression   . MS (multiple sclerosis) ([place])   . Pregnancy induced hypertension     Family History  Problem Relation Age of Onset  . Diabetes Mellitus II Maternal Aunt   . Anesthesia problems Neg Hx   . Hypotension Neg Hx   . Malignant hyperthermia Neg Hx     Past Surgical History:  Procedure Laterality Date  . NO PAST SURGERIES     Social History   Occupational History    Comment: Materials engineer  . Occupation: Unemployed  Tobacco Use  . Smoking status: Current Every Day Smoker    Packs/day: 0.25     Years: 8.00    Pack years: 2.00    Types: Cigarettes  . Smokeless tobacco: Never Used  Substance and Sexual Activity  . Alcohol use: Yes    Comment: OCC  . Drug use: No  . Sexual activity: Yes

## 2018-09-26 ENCOUNTER — Ambulatory Visit: Payer: Medicaid Other | Admitting: Orthopaedic Surgery

## 2018-10-05 ENCOUNTER — Telehealth: Payer: Medicaid Other | Admitting: Family

## 2018-10-05 DIAGNOSIS — L0291 Cutaneous abscess, unspecified: Secondary | ICD-10-CM

## 2018-10-05 MED ORDER — SULFAMETHOXAZOLE-TRIMETHOPRIM 800-160 MG PO TABS: 1.0000 | ORAL_TABLET | Freq: Two times a day (BID) | ORAL | 0 refills | Status: DC

## 2018-10-05 NOTE — Progress Notes (Signed)
E Visit for Cellulitis  We are sorry that you are not feeling well. Here is how we plan to help!  Based on what you shared with me it looks like you have cellulitis.  Cellulitis looks like areas of skin redness, swelling, and warmth; it develops as a result of bacteria entering under the skin. Little red spots and/or bleeding can be seen in skin, and tiny surface sacs containing fluid can occur. Fever can be present. Cellulitis is almost always on one side of a body, and the lower limbs are the most common site of involvement.   I have prescribed:  Bactrim DS 1 tablet by mouth BID for 7 days.   You need to call your Gyn or PCP and be seen face to face on Monday.    Approximately 5 minutes was spent documenting and reviewing patient's chart.   HOME CARE:  . Take your medications as ordered and take all of them, even if the skin irritation appears to be healing.   GET HELP RIGHT AWAY IF:  . Symptoms that don't begin to go away within 48 hours. . Severe redness persists or worsens . If the area turns color, spreads or swells. . If it blisters and opens, develops yellow-brown crust or bleeds. . You develop a fever or chills. . If the pain increases or becomes unbearable.  . Are unable to keep fluids and food down.  MAKE SURE YOU    Understand these instructions.  Will watch your condition.  Will get help right away if you are not doing well or get worse.  Thank you for choosing an e-visit. Your e-visit answers were reviewed by a board certified advanced clinical practitioner to complete your personal care plan. Depending upon the condition, your plan could have included both over the counter or prescription medications. Please review your pharmacy choice. Make sure the pharmacy is open so you can pick up prescription now. If there is a problem, you may contact your provider through [employer] and have the prescription routed to another pharmacy. Your safety is important to  [place]. If you have drug allergies check your prescription carefully.  For the next 24 hours you can use MyChart to ask questions about today's visit, request a non-urgent call back, or ask for a work or school excuse. You will get an email in the next two days asking about your experience. I hope that your e-visit has been valuable and will speed your recovery.

## 2018-12-16 DIAGNOSIS — F410 Panic disorder [episodic paroxysmal anxiety] without agoraphobia: Secondary | ICD-10-CM | POA: Diagnosis not present

## 2018-12-16 DIAGNOSIS — F330 Major depressive disorder, recurrent, mild: Secondary | ICD-10-CM | POA: Diagnosis not present

## 2019-07-22 DIAGNOSIS — F410 Panic disorder [episodic paroxysmal anxiety] without agoraphobia: Secondary | ICD-10-CM | POA: Diagnosis not present

## 2019-07-22 DIAGNOSIS — F5081 Binge eating disorder: Secondary | ICD-10-CM | POA: Diagnosis not present

## 2019-07-22 DIAGNOSIS — F3341 Major depressive disorder, recurrent, in partial remission: Secondary | ICD-10-CM | POA: Diagnosis not present

## 2019-07-23 DIAGNOSIS — Z419 Encounter for procedure for purposes other than remedying health state, unspecified: Secondary | ICD-10-CM | POA: Diagnosis not present

## 2019-08-23 DIAGNOSIS — Z419 Encounter for procedure for purposes other than remedying health state, unspecified: Secondary | ICD-10-CM | POA: Diagnosis not present

## 2019-09-23 DIAGNOSIS — Z419 Encounter for procedure for purposes other than remedying health state, unspecified: Secondary | ICD-10-CM | POA: Diagnosis not present

## 2019-10-23 DIAGNOSIS — Z419 Encounter for procedure for purposes other than remedying health state, unspecified: Secondary | ICD-10-CM | POA: Diagnosis not present

## 2019-11-04 ENCOUNTER — Ambulatory Visit: Payer: Medicaid Other | Admitting: Family Medicine

## 2019-11-23 DIAGNOSIS — Z419 Encounter for procedure for purposes other than remedying health state, unspecified: Secondary | ICD-10-CM | POA: Diagnosis not present

## 2019-12-23 DIAGNOSIS — Z419 Encounter for procedure for purposes other than remedying health state, unspecified: Secondary | ICD-10-CM | POA: Diagnosis not present

## 2020-01-18 DIAGNOSIS — F3341 Major depressive disorder, recurrent, in partial remission: Secondary | ICD-10-CM | POA: Diagnosis not present

## 2020-01-18 DIAGNOSIS — F5081 Binge eating disorder: Secondary | ICD-10-CM | POA: Diagnosis not present

## 2020-01-18 DIAGNOSIS — F410 Panic disorder [episodic paroxysmal anxiety] without agoraphobia: Secondary | ICD-10-CM | POA: Diagnosis not present

## 2020-01-23 DIAGNOSIS — Z419 Encounter for procedure for purposes other than remedying health state, unspecified: Secondary | ICD-10-CM | POA: Diagnosis not present

## 2020-02-23 DIAGNOSIS — Z419 Encounter for procedure for purposes other than remedying health state, unspecified: Secondary | ICD-10-CM | POA: Diagnosis not present

## 2020-03-16 ENCOUNTER — Telehealth: Payer: Medicaid Other | Admitting: Family

## 2020-03-16 ENCOUNTER — Encounter (INDEPENDENT_AMBULATORY_CARE_PROVIDER_SITE_OTHER): Payer: Self-pay

## 2020-03-16 DIAGNOSIS — K0889 Other specified disorders of teeth and supporting structures: Secondary | ICD-10-CM | POA: Diagnosis not present

## 2020-03-16 MED ORDER — AMOXICILLIN-POT CLAVULANATE 875-125 MG PO TABS: 1.0000 | ORAL_TABLET | Freq: Two times a day (BID) | ORAL | 0 refills | Status: DC

## 2020-03-16 NOTE — Progress Notes (Signed)
E-Visit for Dental Pain  We are sorry that you are not feeling well.  Here is how we plan to help!  Based on what you have shared with me in the questionnaire, it sounds like you have dental abscess.   Augmentin 875mg  twice per day for 10 days  It is imperative that you see a dentist within 10 days of this eVisit to determine the cause of the dental pain and be sure it is adequately treated  A toothache or tooth pain is caused when the nerve in the root of a tooth or surrounding a tooth is irritated. Dental (tooth) infection, decay, injury, or loss of a tooth are the most common causes of dental pain. Pain may also occur after an extraction (tooth is pulled out). Pain sometimes originates from other areas and radiates to the jaw, thus appearing to be tooth pain.Bacteria growing inside your mouth can contribute to gum disease and dental decay, both of which can cause pain. A toothache occurs from inflammation of the central portion of the tooth called pulp. The pulp contains nerve endings that are very sensitive to pain. Inflammation to the pulp or pulpitis may be caused by dental cavities, trauma, and infection.    HOME CARE:   For toothaches: . Over-the-counter pain medications such as acetaminophen or ibuprofen may be used. Take these as directed on the package while you arrange for a dental appointment. . Avoid very cold or hot foods, because they may make the pain worse. . You may get relief from biting on a cotton ball soaked in oil of cloves. You can get oil of cloves at most drug stores.  For jaw pain: .  Aspirin may be helpful for problems in the joint of the jaw in adults. . If pain happens every time you open your mouth widely, the temporomandibular joint (TMJ) may be the source of the pain. Yawning or taking a large bite of food may worsen the pain. An appointment with your doctor or dentist will help you find the cause.     GET HELP RIGHT AWAY IF:  . You have a high fever or  chills . If you have had a recent head or face injury and develop headache, light headedness, nausea, vomiting, or other symptoms that concern you after an injury to your face or mouth, you could have a more serious injury in addition to your dental injury. . A facial rash associated with a toothache: This condition may improve with medication. Contact your doctor for them to decide what is appropriate. . Any jaw pain occurring with chest pain: Although jaw pain is most commonly caused by dental disease, it is sometimes referred pain from other areas. People with heart disease, especially people who have had stents placed, people with diabetes, or those who have had heart surgery may have jaw pain as a symptom of heart attack or angina. If your jaw or tooth pain is associated with lightheadedness, sweating, or shortness of breath, you should see a doctor as soon as possible. . Trouble swallowing or excessive pain or bleeding from gums: If you have a history of a weakened immune system, diabetes, or steroid use, you may be more susceptible to infections. Infections can often be more severe and extensive or caused by unusual organisms. Dental and gum infections in people with these conditions may require more aggressive treatment. An abscess may need draining or IV antibiotics, for example.  MAKE SURE YOU    Understand these instructions.  Will watch your condition.  Will get help right away if you are not doing well or get worse.  Thank you for choosing an e-visit. Your e-visit answers were reviewed by a board certified advanced clinical practitioner to complete your personal care plan. Depending upon the condition, your plan could have included both over the counter or prescription medications. Please review your pharmacy choice. Make sure the pharmacy is open so you can pick up prescription now. If there is a problem, you may contact your provider through [employer] and have the prescription  routed to another pharmacy. Your safety is important to [place]. If you have drug allergies check your prescription carefully.  For the next 24 hours you can use MyChart to ask questions about today's visit, request a non-urgent call back, or ask for a work or school excuse. You will get an email in the next two days asking about your experience. I hope that your e-visit has been valuable and will speed your recovery.  Approximately 5 minutes was spent documenting and reviewing patient's chart.

## 2020-03-22 DIAGNOSIS — Z419 Encounter for procedure for purposes other than remedying health state, unspecified: Secondary | ICD-10-CM | POA: Diagnosis not present

## 2020-04-22 DIAGNOSIS — Z419 Encounter for procedure for purposes other than remedying health state, unspecified: Secondary | ICD-10-CM | POA: Diagnosis not present

## 2020-05-22 DIAGNOSIS — Z419 Encounter for procedure for purposes other than remedying health state, unspecified: Secondary | ICD-10-CM | POA: Diagnosis not present

## 2020-06-04 ENCOUNTER — Telehealth: Payer: Medicaid Other | Admitting: Orthopedic Surgery

## 2020-06-04 DIAGNOSIS — R21 Rash and other nonspecific skin eruption: Secondary | ICD-10-CM

## 2020-06-04 MED ORDER — DOXYCYCLINE HYCLATE 100 MG PO TABS: 100.0000 mg | ORAL_TABLET | Freq: Two times a day (BID) | ORAL | 0 refills | Status: AC

## 2020-06-04 NOTE — Progress Notes (Signed)
E Visit for Rash  We are sorry that you are not feeling well. Here is how we plan to help!  That's an unusual rash and I think it may be related to a skin infection. I am prescribing you:   Doxycycline 100 mg twice per day for 7 days    If this does not begin to improve within 48 hours please arrange to see someone in person.  HOME CARE:   Take cool showers and avoid direct sunlight.  Apply cool compress or wet dressings.  Take a bath in an oatmeal bath.  Sprinkle content of one Aveeno packet under running faucet with comfortably warm water.  Bathe for 15-20 minutes, 1-2 times daily.  Pat dry with a towel. Do not rub the rash.  Use hydrocortisone cream.  Take an antihistamine like Benadryl for widespread rashes that itch.  The adult dose of Benadryl is 25-50 mg by mouth 4 times daily.  Caution:  This type of medication may cause sleepiness.  Do not drink alcohol, drive, or operate dangerous machinery while taking antihistamines.  Do not take these medications if you have prostate enlargement.  Read package instructions thoroughly on all medications that you take.  GET HELP RIGHT AWAY IF:   Symptoms don't go away after treatment.  Severe itching that persists.  If you rash spreads or swells.  If you rash begins to smell.  If it blisters and opens or develops a yellow-brown crust.  You develop a fever.  You have a sore throat.  You become short of breath.  MAKE SURE YOU:  Understand these instructions. Will watch your condition. Will get help right away if you are not doing well or get worse.  Thank you for choosing an e-visit. Your e-visit answers were reviewed by a board certified advanced clinical practitioner to complete your personal care plan. Depending upon the condition, your plan could have included both over the counter or prescription medications. Please review your pharmacy choice. Be sure that the pharmacy you have chosen is open so that you can pick  up your prescription now.  If there is a problem you may message your provider in MyChart to have the prescription routed to another pharmacy. Your safety is important to [place]. If you have drug allergies check your prescription carefully.  For the next 24 hours, you can use MyChart to ask questions about today's visit, request a non-urgent call back, or ask for a work or school excuse from your e-visit provider. You will get an email in the next two days asking about your experience. I hope that your e-visit has been valuable and will speed your recovery.  Greater than 5 minutes, yet less than 10 minutes of time have been spent researching, coordinating and implementing care for this patient today.

## 2020-06-05 ENCOUNTER — Encounter: Payer: Medicaid Other | Admitting: Physician Assistant

## 2020-06-05 NOTE — Progress Notes (Signed)
Based on what you shared with me, I feel your condition warrants further evaluation and I recommend that you be seen for a face to face visit. We cannot treat minors via e-visit. Giving the facial swelling for him, I recommend taking him to be seen at a local Urgent Care this morning ASAP. You can also reach out to his pediatrician as they should have an after-hours nurse or doctor you can speak to for further instruction. This is not something I would feel comfortable taking care of via video at this point. I know he was given the antibiotic for cellulitis but I am concerned that something else is actually the cause of all his symptoms and he needs a further assessment than what we can do via e-visit or video on demand visit. Please contact your primary care physician practice to be seen. Many offices offer virtual options to be seen via video if you are not comfortable going in person to a medical facility at this time.  If you do not have a PCP, [facility] offers a free physician referral service available at [phone]. Our trained staff has the experience, knowledge and resources to put you in touch with a physician who is right for you.   You also have the option of a video visit through [email or web address]  If you are having a true medical emergency please call 911.  NOTE: If you entered your credit card information for this eVisit, you will not be charged. You may see a "hold" on your card for the $35 but that hold will drop off and you will not have a charge processed.  Your e-visit answers were reviewed by a board certified advanced clinical practitioner to complete your personal care plan.  Thank you for using e-Visits.

## 2020-06-22 DIAGNOSIS — Z419 Encounter for procedure for purposes other than remedying health state, unspecified: Secondary | ICD-10-CM | POA: Diagnosis not present

## 2020-07-14 DIAGNOSIS — F5081 Binge eating disorder: Secondary | ICD-10-CM | POA: Diagnosis not present

## 2020-07-14 DIAGNOSIS — F410 Panic disorder [episodic paroxysmal anxiety] without agoraphobia: Secondary | ICD-10-CM | POA: Diagnosis not present

## 2020-07-14 DIAGNOSIS — F330 Major depressive disorder, recurrent, mild: Secondary | ICD-10-CM | POA: Diagnosis not present

## 2020-07-22 DIAGNOSIS — Z419 Encounter for procedure for purposes other than remedying health state, unspecified: Secondary | ICD-10-CM | POA: Diagnosis not present

## 2020-08-22 DIAGNOSIS — Z419 Encounter for procedure for purposes other than remedying health state, unspecified: Secondary | ICD-10-CM | POA: Diagnosis not present

## 2020-09-22 DIAGNOSIS — Z419 Encounter for procedure for purposes other than remedying health state, unspecified: Secondary | ICD-10-CM | POA: Diagnosis not present

## 2020-09-28 ENCOUNTER — Telehealth: Payer: Medicaid Other | Admitting: Family Medicine

## 2020-09-28 DIAGNOSIS — L02221 Furuncle of abdominal wall: Secondary | ICD-10-CM

## 2020-09-28 MED ORDER — DOXYCYCLINE HYCLATE 100 MG PO TABS: 100.0000 mg | ORAL_TABLET | Freq: Two times a day (BID) | ORAL | 0 refills | Status: AC

## 2020-09-28 NOTE — Progress Notes (Signed)
  E Visit for Folliculitis   We are sorry you are not feeling well.  Here is how we plan to help!  Based on what you have shared with me it looks like you have folliculitis.  Folliculitis refers to inflammation of the superficial or deep portio of the hair follicle.  It can be infectious or non-infectious. Various bacteria, fungi, viruses, and parasites can cause infectious folliculis  Based upon what you have shared with me it looks like you have a bacterial follicultits.  Folliculitis is inflammation of the hair follicles that can be caused by a superficial infection of the skin and is treated with an antibiotic. I have prescribed: and Doxycycline 100 mg twice per day for 7 days    HOME CARE: Apply a warm, moist washcloth or compress using a saltwater solution (1 teaspoon of table salt to 2 cups water) Apply over the counter antibiotic cream, gel or wash  Apply soothing lotions such as oatmeal lotion or over the counter hydrocortisone cream Clean the affected skin twice daily with antibacterial soap. Use clean washcloth and towel each time and do not share with anyone.  Wash these items and clothes that have touched the area with hot soapy water. Protect the skin. If possible avoid shaving.  If you must shave, try an electric razor.  When done, rinse skin with warm water and apply moisturizer.  GET HELP RIGHT AWAY IF: You have extensive skin involvement or the symptoms return after treatment Symptoms don't go away after treatment. Severe itching that persists. If you rash spreads or swells. If you rash begins to smell. If it blisters and opens or develops a yellow-brown crust. You develop a fever. You have a sore throat. You become short of breath.  MAKE SURE YOU:  Understand these instructions. Will watch your condition. Will get help right away if you are not doing well or get worse.  Thank you for choosing an e-visit.  Your e-visit answers were reviewed by a board certified  advanced clinical practitioner to complete your personal care plan. Depending upon the condition, your plan could have included both over the counter or prescription medications.  Please review your pharmacy choice. Make sure the pharmacy is open so you can pick up prescription now. If there is a problem, you may contact your provider through MyChart messaging and have the prescription routed to another pharmacy.  Your safety is important to us. If you have drug allergies check your prescription carefully.   For the next 24 hours you can use MyChart to ask questions about today's visit, request a non-urgent call back, or ask for a work or school excuse. You will get an email in the next two days asking about your experience. I hope that your e-visit has been valuable and will speed your recovery.  I provided 5 minutes of non face-to-face time during this encounter for chart review, medication and order placement, as well as and documentation.   

## 2020-10-11 DIAGNOSIS — Z0271 Encounter for disability determination: Secondary | ICD-10-CM

## 2020-10-22 DIAGNOSIS — Z419 Encounter for procedure for purposes other than remedying health state, unspecified: Secondary | ICD-10-CM | POA: Diagnosis not present

## 2020-11-16 ENCOUNTER — Telehealth: Payer: Medicaid Other | Admitting: Family Medicine

## 2020-11-16 DIAGNOSIS — J069 Acute upper respiratory infection, unspecified: Secondary | ICD-10-CM

## 2020-11-16 DIAGNOSIS — J329 Chronic sinusitis, unspecified: Secondary | ICD-10-CM

## 2020-11-16 MED ORDER — FLUTICASONE PROPIONATE 50 MCG/ACT NA SUSP: 2.0000 | Freq: Every day | NASAL | 0 refills | Status: DC

## 2020-11-16 MED ORDER — BENZONATATE 100 MG PO CAPS: 100.0000 mg | ORAL_CAPSULE | Freq: Two times a day (BID) | ORAL | 0 refills | Status: DC | PRN

## 2020-11-16 MED ORDER — PROMETHAZINE-DM 6.25-15 MG/5ML PO SYRP: 2.5000 mL | ORAL_SOLUTION | Freq: Three times a day (TID) | ORAL | 0 refills | Status: DC | PRN

## 2020-11-16 NOTE — Addendum Note (Signed)
Addended by: Freddy Finner on: 11/16/2020 10:30 AM   Modules accepted: Orders, Level of Service

## 2020-11-16 NOTE — Progress Notes (Addendum)
[place]  This was for her son- I have removed the visit and cancled the medications and provided info on going in to her sons chart for a visit for him.

## 2020-11-22 DIAGNOSIS — Z419 Encounter for procedure for purposes other than remedying health state, unspecified: Secondary | ICD-10-CM | POA: Diagnosis not present

## 2020-12-22 DIAGNOSIS — Z419 Encounter for procedure for purposes other than remedying health state, unspecified: Secondary | ICD-10-CM | POA: Diagnosis not present

## 2020-12-24 ENCOUNTER — Emergency Department (HOSPITAL_COMMUNITY)
Admission: EM | Admit: 2020-12-24 | Discharge: 2020-12-25 | Disposition: A | Discharge status: Home / Self Care | Payer: Medicaid Other | Attending: Emergency Medicine | Admitting: Emergency Medicine

## 2020-12-24 DIAGNOSIS — R0902 Hypoxemia: Secondary | ICD-10-CM | POA: Diagnosis not present

## 2020-12-24 DIAGNOSIS — R609 Edema, unspecified: Secondary | ICD-10-CM | POA: Diagnosis not present

## 2020-12-24 DIAGNOSIS — F17210 Nicotine dependence, cigarettes, uncomplicated: Secondary | ICD-10-CM | POA: Insufficient documentation

## 2020-12-24 DIAGNOSIS — W010XXA Fall on same level from slipping, tripping and stumbling without subsequent striking against object, initial encounter: Secondary | ICD-10-CM | POA: Insufficient documentation

## 2020-12-24 DIAGNOSIS — Y92818 Other transport vehicle as the place of occurrence of the external cause: Secondary | ICD-10-CM | POA: Diagnosis not present

## 2020-12-24 DIAGNOSIS — S8992XA Unspecified injury of left lower leg, initial encounter: Secondary | ICD-10-CM | POA: Diagnosis present

## 2020-12-24 DIAGNOSIS — S83005A Unspecified dislocation of left patella, initial encounter: Secondary | ICD-10-CM

## 2020-12-24 DIAGNOSIS — M25562 Pain in left knee: Secondary | ICD-10-CM | POA: Diagnosis not present

## 2020-12-24 DIAGNOSIS — S83002A Unspecified subluxation of left patella, initial encounter: Secondary | ICD-10-CM | POA: Insufficient documentation

## 2020-12-24 DIAGNOSIS — S80919A Unspecified superficial injury of unspecified knee, initial encounter: Secondary | ICD-10-CM | POA: Diagnosis not present

## 2020-12-24 DIAGNOSIS — M25462 Effusion, left knee: Secondary | ICD-10-CM | POA: Diagnosis not present

## 2020-12-24 DIAGNOSIS — S83012A Lateral subluxation of left patella, initial encounter: Secondary | ICD-10-CM | POA: Diagnosis not present

## 2020-12-24 DIAGNOSIS — Z743 Need for continuous supervision: Secondary | ICD-10-CM | POA: Diagnosis not present

## 2020-12-24 DIAGNOSIS — R6889 Other general symptoms and signs: Secondary | ICD-10-CM | POA: Diagnosis not present

## 2020-12-24 NOTE — ED Provider Notes (Signed)
Emergency Medicine Provider Triage Evaluation Note  RUDY DOMEK , a 40 y.o. female  was evaluated in triage.  Pt complains of left knee pain.  States stepped out of her [place] and patella dislocated laterally.  No head injury or LOC.  States when trying to bear weight patella keeps moving out of position.  Admits to 4 beers PTA.  Review of Systems  Positive: Knee pain Negative: Head injury, LOC  Physical Exam    Gen:   Awake, no distress   Resp:  Normal effort  MSK:   Moves extremities without difficulty  Other:  Left leg in knee splint from EMS, some swelling noted  Medical Decision Making  Medically screening exam initiated at 11:48 PM.  Appropriate orders placed.  HARPER VANDERVOORT was informed that the remainder of the evaluation will be completed by another provider, this initial triage assessment does not replace that evaluation, and the importance of remaining in the ED until their evaluation is complete.  Left knee pain s/p fall.  X-ray ordered.   Garlon Hatchet, PA-C 12/25/20 0002    Nira Conn, MD 12/25/20 [phone]

## 2020-12-24 NOTE — ED Triage Notes (Signed)
The pt arrived by gems from home  she slipped in the rail  and fell in the mud twisting her lt knee.  Hx of ms  the pt reports drinking  4 beers prior to her fall

## 2020-12-25 ENCOUNTER — Encounter (HOSPITAL_COMMUNITY): Payer: Self-pay | Admitting: *Deleted

## 2020-12-25 ENCOUNTER — Emergency Department (HOSPITAL_COMMUNITY): Payer: Medicaid Other

## 2020-12-25 ENCOUNTER — Other Ambulatory Visit: Payer: Self-pay

## 2020-12-25 DIAGNOSIS — M25562 Pain in left knee: Secondary | ICD-10-CM | POA: Diagnosis not present

## 2020-12-25 DIAGNOSIS — M25462 Effusion, left knee: Secondary | ICD-10-CM | POA: Diagnosis not present

## 2020-12-25 DIAGNOSIS — S83012A Lateral subluxation of left patella, initial encounter: Secondary | ICD-10-CM | POA: Diagnosis not present

## 2020-12-25 MED ORDER — HYDROMORPHONE HCL 1 MG/ML IJ SOLN
1.0000 mg | Freq: Once | INTRAMUSCULAR | Status: AC
  Administered 2020-12-25: 09:00:00 1 mg via INTRAVENOUS
  Filled 2020-12-25: qty 1

## 2020-12-25 MED ORDER — ONDANSETRON HCL 4 MG/2ML IJ SOLN
4.0000 mg | Freq: Once | INTRAMUSCULAR | Status: AC
  Administered 2020-12-25: 09:00:00 4 mg via INTRAVENOUS
  Filled 2020-12-25: qty 2

## 2020-12-25 MED ORDER — OXYCODONE-ACETAMINOPHEN 5-325 MG PO TABS: 1.0000 | ORAL_TABLET | Freq: Three times a day (TID) | ORAL | 0 refills | Status: DC | PRN

## 2020-12-25 MED ORDER — KETOROLAC TROMETHAMINE 30 MG/ML IJ SOLN
30.0000 mg | Freq: Once | INTRAMUSCULAR | Status: AC
  Administered 2020-12-25: 08:00:00 30 mg via INTRAMUSCULAR
  Filled 2020-12-25: qty 1

## 2020-12-25 NOTE — ED Provider Notes (Signed)
[facility] EMERGENCY DEPARTMENT Provider Note   CSN: [id number] Arrival date & time: 12/24/20  2129     History Chief Complaint  Patient presents with   Leg Injury    Patricia Jensen is a 40 y.o. female.  HPI Patient presents with left knee injury.  States she was stepping out of the [place] and stepped on some mud.  States her knee twisted and had severe pain.  States her patella was off to the side.  States it is since popped back but is also popped in and out since then.  Pain on the medial aspect of the knee also.  States there is more swelling now.  Had over 10 hours from first arriving in the ER until she made it back into the room although she had been screened prior to this.  No other injury.  States she has had knee injury before but not had surgery.    Past Medical History:  Diagnosis Date   Anxiety    Depression    MS (multiple sclerosis) (HCC)    Pregnancy induced hypertension     Patient Active Problem List   Diagnosis Date Noted   Abnormal laboratory test 04/21/2018   Multiple sclerosis (HCC) 10/25/2017   Low back pain 12/09/2013   Pregnancy induced hypertension    Anxiety    MS (multiple sclerosis) (HCC)    Lower extremity numbness 02/11/2012   Tobacco abuse 02/11/2012   Depression 01/23/2011   SVD (spontaneous vaginal delivery) 01/22/2011    Past Surgical History:  Procedure Laterality Date   NO PAST SURGERIES       OB History     Gravida  3   Para  3   Term  3   Preterm  0   AB  0   Living  3      SAB  0   IAB  0   Ectopic  0   Multiple  0   Live Births  3           Family History  Problem Relation Age of Onset   Diabetes Mellitus II Maternal Aunt    Anesthesia problems Neg Hx    Hypotension Neg Hx    Malignant hyperthermia Neg Hx     Social History   Tobacco Use   Smoking status: Every Day    Packs/day: 0.25    Years: 8.00    Pack years: 2.00    Types: Cigarettes   Smokeless tobacco: Never   Substance Use Topics   Alcohol use: Yes    Comment: OCC   Drug use: No    Home Medications Prior to Admission medications   Medication Sig Start Date End Date Taking? Authorizing Provider  oxyCODONE-acetaminophen (PERCOCET/ROXICET) 5-325 MG tablet Take 1-2 tablets by mouth every 8 (eight) hours as needed for severe pain. 12/25/20  Yes Benjiman Core, MD  ALPRAZolam (XANAX XR) 1 MG 24 hr tablet Take 1 mg by mouth 2 (two) times daily.     [provider]  ALPRAZolam Prudy Feeler) 1 MG tablet Take 1-2 mg by mouth 2 (two) times daily as needed for anxiety or sleep.     [provider]  carboxymethylcellulose (REFRESH PLUS) 0.5 % SOLN Place 2 drops into both eyes daily as needed (for contacts).    [provider]  Cholecalciferol (VITAMIN D PO) Take 3,000 Units by mouth daily.    [provider]  citalopram (CELEXA) 40 MG tablet Take 40 mg  by mouth at bedtime.     [provider]  Dimethyl Fumarate (TECFIDERA) 240 MG CPDR Take 240 capsules by mouth 2 (two) times daily. She will begin treatment with 120mg  and 240mg  starter pack.  Maintenance dose:  240mg  BID.    [provider]    Allergies    Patient has no known allergies.  Review of Systems   Review of Systems  Constitutional:  Negative for appetite change.  Cardiovascular:  Negative for chest pain.  Gastrointestinal:  Negative for abdominal pain.  Genitourinary:  Negative for flank pain.  Musculoskeletal:        Left knee pain.  Skin:  Negative for wound.  Neurological:  Negative for weakness.  Psychiatric/Behavioral:  Negative for confusion.    Physical Exam Updated Vital Signs BP (!) 141/72   Pulse 94   Temp 97.9 F (36.6 C) (Oral)   Resp 17   Ht 5\' 5"  (1.651 m)   Wt 102.1 kg   SpO2 98%   BMI 37.44 kg/m   Physical Exam Vitals and nursing note reviewed.  HENT:     Head: Normocephalic.  Musculoskeletal:        General: Tenderness present.     Cervical back: Neck  supple.     Comments: Tenderness to the left knee particularly medially.  Patella appears anterior and located.  Tenderness with some effusion laterally but particularly tender medially but also more tender over the inferior aspect of the knee medially.  No tenderness over hip or ankle.  Skin:    Capillary Refill: Capillary refill takes less than 2 seconds.  Neurological:     Mental Status: She is alert and oriented to person, place, and time.    ED Results / Procedures / Treatments   Labs (all labs ordered are listed, but only abnormal results are displayed) Labs Reviewed - No data to display  EKG None  Radiology CT Knee Left Wo Contrast  Result Date: 12/25/2020 CLINICAL DATA:  Twisting injury left knee after fall. EXAM: CT OF THE left KNEE WITHOUT CONTRAST TECHNIQUE: Multidetector CT imaging of the left knee was performed according to the standard protocol. Multiplanar CT image reconstructions were also generated. COMPARISON:  Radiographs 12/25/2020 FINDINGS: Bones/Joint/Cartilage The patella that is subluxed laterally but not overtly dislocated at this time. There is substantial abnormal edema tracking along the medial retinaculum and expected region of the medial patellofemoral ligament, which may be torn or injured. Accordingly the possibility of recent transient patellar dislocation is a distinct possibility. 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 Tibial tubercle-trochlear groove distance 2.1 cm, mildly abnormally elevated. Small knee joint effusion. Ligaments Suboptimally assessed by CT. 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 Muscles and Tendons There is considerable abnormal fluid signal along the posterior sartorius muscle for example on image 55 of series 4., with  indistinctness of the distal sartorius tendon which could be torn. Soft tissues Edema tracks superficial to the pes anserinus. There is also some low-level edema infiltrating the soft tissues posterior to the distal biceps femoris musculotendinous junction for example on image 83 series 4. IMPRESSION: 1. The patella is subluxed laterally but not overtly dislocated at this time. Probably torn medial patellar retinaculum and medial patellofemoral ligament given the degree of surrounding edema and indistinctness. 2. Elevated tibial tubercle-trochlear groove distance at 2.1 cm.  3. 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 4. Small knee joint effusion. 5. Abnormal fluid tracks along the posterior margin of the distal sartorius muscle, and the distal sartorius tendon is indistinct and could be torn. Edema tracks superficial to the pes anserinus. 6. Low-grade infiltrative edema posterior to the distal biceps femoris musculotendinous junction, nonspecific. Electronically Signed   By: Gaylyn Rong M.D.   On: 12/25/2020 09:46   DG Knee Complete 4 Views Left  Result Date: 12/25/2020 CLINICAL DATA:  Recent trip and fall with left knee pain, initial encounter EXAM: LEFT KNEE - COMPLETE 4+ VIEW COMPARISON:  None. FINDINGS: The patella appears laterally displaced consistent with dislocation. Avulsion from the medial femoral condyle superiorly is noted. Small joint effusion is seen. No other bony abnormality is noted. IMPRESSION: Findings suggestive of patellar dislocation laterally. Avulsion from the superior aspect of the medial femoral condyle. Electronically Signed   By: Alcide Clever M.D.   On: 12/25/2020 00:27    Procedures Procedures   Medications Ordered in ED Medications  ketorolac (TORADOL) 30 MG/ML injection 30 mg (30 mg Intramuscular Given 12/25/20  0815)  ondansetron (ZOFRAN) injection 4 mg (4 mg Intravenous Given 12/25/20 0845)  HYDROmorphone (DILAUDID) injection 1 mg (1 mg Intravenous Given 12/25/20 0846)    ED Course  I have reviewed the triage vital signs and the nursing notes.  Pertinent labs & imaging results that were available during my care of the patient were reviewed by me and considered in my medical decision making (see chart for details).    MDM Rules/Calculators/A&P                           Patient presents after mechanical fall.  Did have delay before my seeing her.  Does have subluxation of left patella.  Has dislocated while in the ER but will spontaneously reduce.  Did have tenderness more than expected on the inferior medial aspect of the knee.  CT scan done and showed possible sartorius tendon injury and laterally subluxed patella.  Discussed with Dr. Blanchie Dessert, from orthopedic surgery.  Knee immobilizer weight-bear as tolerated.  Pain medicine.  Follow-up with the office.  Will discharge home. Final Clinical Impression(s) / ED Diagnoses Final diagnoses:  Dislocation of left patella, initial encounter    Rx / DC Orders ED Discharge Orders          Ordered    oxyCODONE-acetaminophen (PERCOCET/ROXICET) 5-325 MG tablet  Every 8 hours PRN        12/25/20 1059             Benjiman Core, MD 12/25/20 1147

## 2020-12-25 NOTE — ED Notes (Signed)
Patient transported to CT 

## 2020-12-26 ENCOUNTER — Telehealth: Payer: Self-pay

## 2020-12-26 DIAGNOSIS — Z9189 Other specified personal risk factors, not elsewhere classified: Secondary | ICD-10-CM

## 2020-12-26 NOTE — Telephone Encounter (Signed)
Transition Care Management Follow-up Telephone Call Date of discharge and from where: 12/25/2020-[place]  How have you been since you were released from the hospital? Patient stated she is doing ok but still having a hard time with crutches.  Any questions or concerns? No  Items Reviewed: Did the pt receive and understand the discharge instructions provided? Yes  Medications obtained and verified? Yes  Other? No  Any new allergies since your discharge? No  Dietary orders reviewed? No Do you have support at home? Yes   Home Care and Equipment/Supplies: Were home health services ordered? not applicable If so, what is the name of the agency? N/A  Has the agency set up a time to come to the patient's home? not applicable Were any new equipment or medical supplies ordered?  No What is the name of the medical supply agency? N/A Were you able to get the supplies/equipment? not applicable Do you have any questions related to the use of the equipment or supplies? No  Functional Questionnaire: (I = Independent and D = Dependent) ADLs: I  Bathing/Dressing- I  Meal Prep- I  Eating- I  Maintaining continence- I  Transferring/Ambulation- I  Managing Meds- I  Follow up appointments reviewed:  PCP Hospital f/u appt confirmed? No   Specialist Hospital f/u appt confirmed? Yes  Scheduled to see Ortho on 12/27/2020 @ 9am. Are transportation arrangements needed? No  If their condition worsens, is the pt aware to call PCP or go to the Emergency Dept.? Yes Was the patient provided with contact information for the PCP's office or ED? Yes Was to pt encouraged to call back with questions or concerns? Yes  Patient is requesting referral for assistance with finding a PCP.

## 2020-12-27 DIAGNOSIS — M25562 Pain in left knee: Secondary | ICD-10-CM | POA: Diagnosis not present

## 2020-12-27 DIAGNOSIS — M25561 Pain in right knee: Secondary | ICD-10-CM | POA: Diagnosis not present

## 2021-01-07 DIAGNOSIS — M25562 Pain in left knee: Secondary | ICD-10-CM | POA: Diagnosis not present

## 2021-01-10 ENCOUNTER — Other Ambulatory Visit: Payer: Self-pay

## 2021-01-10 ENCOUNTER — Encounter (HOSPITAL_BASED_OUTPATIENT_CLINIC_OR_DEPARTMENT_OTHER): Payer: Self-pay | Admitting: Orthopaedic Surgery

## 2021-01-10 DIAGNOSIS — M25562 Pain in left knee: Secondary | ICD-10-CM | POA: Diagnosis not present

## 2021-01-17 DIAGNOSIS — M25562 Pain in left knee: Secondary | ICD-10-CM | POA: Diagnosis not present

## 2021-01-17 NOTE — H&P (Signed)
PREOPERATIVE H&P  Chief Complaint: LEFT KNEE MCL ACL TEARS, MEDIAL MENISCUS BUCKET HANDLE TEAR  HPI: Patricia Jensen is a 40 y.o. female who is scheduled for, Procedure(s): KNEE ARTHROSCOPY WITH ANTERIOR CRUCIATE LIGAMENT (ACL) REPAIR KNEE ARTHROSCOPY WITH MEDIAL COLLATERAL LIGAMENT RECONSTRUCTION WITH MEDIAL MENISCUS REPAIR VS PARTIAL MENISECTOMY.   Patient has a past medical history significant for MS.   Patricia Jensen presents for a follow-up after going to the emergency room  for her left knee patella dislocation. 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 She reports no significant issues for many years but for the past few months she has had increasing episodes of instability in the left patella as well as a dislocation which occurred on Saturday. She was seen in the emergency room and was found to have an avulsion fracture. She was placed in a knee immobilizer. Since the dislocation she has had significant swelling in the knee, difficulty bending the knee and weight bearing. She has been placed in a knee immobilizer. She also reports subluxation events occurring more recently in the right knee but no frank dislocation. She has significant pain at this time.   She has apprehension and feelings of instability.  Her symptoms are rated as moderate to severe, and have been worsening.  This is significantly impairing activities of daily living.    Please see clinic note for further details on this patient's care.    She has elected for surgical management.   Past Medical History:  Diagnosis Date   Anxiety    Depression    MS (multiple sclerosis) (HCC)    Pregnancy induced hypertension    Past Surgical History:  Procedure Laterality Date   NO PAST SURGERIES     Social History   Socioeconomic History   Marital status: Married    Spouse name: Patricia Jensen   Number of children: 3   Years of education: 12   Highest education level: Not on file   Occupational History    Comment: Home maker   Occupation: Unemployed  Tobacco Use   Smoking status: Every Day    Packs/day: 0.25    Years: 8.00    Pack years: 2.00    Types: Cigarettes   Smokeless tobacco: Never  Vaping Use   Vaping Use: Never used  Substance and Sexual Activity   Alcohol use: Yes    Comment: OCC   Drug use: No   Sexual activity: Yes  Other Topics Concern   Not on file  Social History Narrative   Patient lives at home with with her husband (Patricia Jensen- Patricia Jensen)  and children. Patient has a high school education.   Right handed.   Caffeine- one cup daily.   Social Determinants of Health   Financial Resource Strain: Not on file  Food Insecurity: Not on file  Transportation Needs: Not on file  Physical Activity: Not on file  Stress: Not on file  Social Connections: Not on file   Family History  Problem Relation Age of Onset   Diabetes Mellitus II Maternal Aunt    Anesthesia problems Neg Hx    Hypotension Neg Hx    Malignant hyperthermia Neg Hx    No Known Allergies Prior to Admission medications   Medication Sig Start Date End Date Taking? Authorizing Provider  ALPRAZolam (XANAX XR) 1 MG 24 hr tablet Take 1 mg by mouth 2 (two) times daily.    Yes [provider]  ALPRAZolam Prudy Feeler)  1 MG tablet Take 1-2 mg by mouth 2 (two) times daily as needed for anxiety or sleep.    Yes [provider]  carboxymethylcellulose (REFRESH PLUS) 0.5 % SOLN Place 2 drops into both eyes daily as needed (for contacts).   Yes [provider]  citalopram (CELEXA) 40 MG tablet Take 40 mg by mouth at bedtime.    Yes [provider]  lisdexamfetamine (VYVANSE) 40 MG capsule Take 40 mg by mouth every morning.   Yes [provider]  Cholecalciferol (VITAMIN D PO) Take 3,000 Units by mouth daily.    [provider]  oxyCODONE-acetaminophen (PERCOCET/ROXICET) 5-325 MG tablet Take 1-2 tablets by mouth every 8 (eight) hours as  needed for severe pain. 12/25/20   Patricia Core, MD    ROS: All other systems have been reviewed and were otherwise negative with the exception of those mentioned in the HPI and as above.  Physical Exam: General: Alert, no acute distress Cardiovascular: No pedal edema Respiratory: No cyanosis, no use of accessory musculature GI: No organomegaly, abdomen is soft and non-tender Skin: No lesions in the area of chief complaint Neurologic: Sensation intact distally Psychiatric: Patient is competent for consent with normal mood and affect Lymphatic: No axillary or cervical lymphadenopathy  MUSCULOSKELETAL:  Left knee: large knee effusion. She does not tolerate range of motion due to pain. She has diffuse tenderness about the knee. Patella clinically appears to be reduced in the trochlear groove. She has apprehension with testing. Her knee is stable to varus and valgus stress. The right knee has a range of motion of 0-120 degrees. No knee effusion. She does have patellar apprehension with testing. She has a 1+ Lachman's.  Negative anterior drawer, negative posterior drawer.  Imaging: X-rays and CT scan from the emergency room as well as obtained today were reviewed and demonstrate a small avulsion fracture along the medial epicondyle of the femur. Lateral tracking of the patella but otherwise reduced. The right knee demonstrated good patellar tracking with no fracture, dislocation or other osseous abnormalities. She did not tolerate the sunrise view of the left knee.   Assessment: LEFT KNEE MCL ACL TEARS, MEDIAL MENISCUS BUCKET HANDLE TEAR  Plan: Plan for Procedure(s): KNEE ARTHROSCOPY WITH ANTERIOR CRUCIATE LIGAMENT (ACL) REPAIR KNEE ARTHROSCOPY WITH MEDIAL COLLATERAL LIGAMENT RECONSTRUCTION WITH MEDIAL MENISCUS REPAIR VS PARTIAL MENISECTOMY  The risks benefits and alternatives were discussed with the patient including but not limited to the risks of nonoperative treatment, versus surgical  intervention including infection, bleeding, nerve injury,  blood clots, cardiopulmonary complications, morbidity, mortality, among others, and they were willing to proceed.   The patient acknowledged the explanation, agreed to proceed with the plan and consent was signed.   Operative Plan: Left knee arthroscopy with ACL with allograft, MCL reconstruction, medial meniscus repair versus meniscectomy  Discharge Medications: Standard DVT Prophylaxis: Aspirin Physical Therapy: Outpatient PT Special Discharge needs: [place]. [facility]   Vernetta Honey, PA-C  01/17/2021 9:37 AM

## 2021-01-17 NOTE — Progress Notes (Signed)

## 2021-01-22 DIAGNOSIS — Z419 Encounter for procedure for purposes other than remedying health state, unspecified: Secondary | ICD-10-CM | POA: Diagnosis not present

## 2021-01-24 ENCOUNTER — Telehealth: Payer: Self-pay

## 2021-01-24 NOTE — Telephone Encounter (Signed)
° °  Telephone encounter was:  Unsuccessful.  01/24/2021 Name: Patricia Jensen MRN: [id number] DOB: Feb 22, 1980  Unsuccessful outbound call made today to assist with:   PCP Search  Outreach Attempt:  1st Attempt  A HIPAA compliant voice message was left requesting a return call.  Instructed patient to call back at [phone] at their earliest convenience.  [place] management  [place], [place]  Main Phone: [phone]   E-mail: [email or web address]  Website: [email or web address]

## 2021-01-25 ENCOUNTER — Telehealth: Payer: Self-pay

## 2021-01-25 NOTE — Telephone Encounter (Signed)
° °  Telephone encounter was:  Successful.  01/25/2021 Name: Patricia Jensen MRN: [id number] DOB: March 12, 1980  Patricia Jensen is a 41 y.o. year old female who is a primary care patient of Hartsell, Marga Hoots, MD . The community resource team was consulted for assistance with  PCP Search, Transportation, Medical Equipment and [facility] guide performed the following interventions:  We discussed her needs in finding a pcp. I have sent pt [facility]/Providers in her area who are accepting new patients and also sent providers information searched by her insurance plan. Pt also added she was interested in Transportation, finding in-home care assistance, and also needed information pertaining to medical equipment (wheelchair).  Pt has been educated on insurance transportation to call DSS for [facility] when rides are needed. Pt has also been sent out [place]'s transportation application. Pt advised she will only need transportation to her physical therapy appointments whenever those are scheduled. Pt feels after knee surgery, she will need in-home care and I have sent her the list of [facility] in-home care agencies. Pt believes she may also need a wheelchair after surgery, therefore, a list of medical equipment companies have been sent starting with [employer].    Follow Up Plan:   Wait on confirmation that e-mail has been received.  [place] management  [place], [place]  Main Phone: [phone]   E-mail: [email or web address]  Website: [email or web address]

## 2021-01-25 NOTE — Telephone Encounter (Signed)
° °  Telephone encounter was:  Unsuccessful.  01/25/2021 Name: Patricia Jensen MRN: [id number] DOB: 02-19-1980  Unsuccessful outbound call made today to assist with:   PCP Search  Outreach Attempt:  2nd Attempt  A HIPAA compliant voice message was left requesting a return call.  Instructed patient to call back at [phone] at their earliest convenience.  [place] management  [place], [place]  Main Phone: [phone]   E-mail: [email or web address]  Website: [email or web address]

## 2021-01-26 ENCOUNTER — Ambulatory Visit (HOSPITAL_BASED_OUTPATIENT_CLINIC_OR_DEPARTMENT_OTHER): Payer: 59 | Admitting: Certified Registered"

## 2021-01-26 ENCOUNTER — Encounter (HOSPITAL_BASED_OUTPATIENT_CLINIC_OR_DEPARTMENT_OTHER): Payer: Self-pay | Admitting: Orthopaedic Surgery

## 2021-01-26 ENCOUNTER — Other Ambulatory Visit: Payer: Self-pay

## 2021-01-26 ENCOUNTER — Encounter (HOSPITAL_BASED_OUTPATIENT_CLINIC_OR_DEPARTMENT_OTHER)
Admission: RE | Disposition: A | Discharge status: Home / Self Care | Payer: Self-pay | Source: Home / Self Care | Attending: Orthopaedic Surgery

## 2021-01-26 ENCOUNTER — Ambulatory Visit (HOSPITAL_BASED_OUTPATIENT_CLINIC_OR_DEPARTMENT_OTHER)
Admission: RE | Admit: 2021-01-26 | Discharge: 2021-01-26 | Disposition: A | Discharge status: Home / Self Care | Payer: 59 | Attending: Orthopaedic Surgery | Admitting: Orthopaedic Surgery

## 2021-01-26 ENCOUNTER — Ambulatory Visit (HOSPITAL_BASED_OUTPATIENT_CLINIC_OR_DEPARTMENT_OTHER): Payer: 59

## 2021-01-26 DIAGNOSIS — S83512A Sprain of anterior cruciate ligament of left knee, initial encounter: Secondary | ICD-10-CM | POA: Insufficient documentation

## 2021-01-26 DIAGNOSIS — X58XXXA Exposure to other specified factors, initial encounter: Secondary | ICD-10-CM | POA: Insufficient documentation

## 2021-01-26 DIAGNOSIS — F17210 Nicotine dependence, cigarettes, uncomplicated: Secondary | ICD-10-CM | POA: Insufficient documentation

## 2021-01-26 DIAGNOSIS — G35 Multiple sclerosis: Secondary | ICD-10-CM | POA: Insufficient documentation

## 2021-01-26 DIAGNOSIS — S83412A Sprain of medial collateral ligament of left knee, initial encounter: Secondary | ICD-10-CM | POA: Insufficient documentation

## 2021-01-26 DIAGNOSIS — S83242A Other tear of medial meniscus, current injury, left knee, initial encounter: Secondary | ICD-10-CM | POA: Diagnosis not present

## 2021-01-26 HISTORY — DX: Keratoconus, unspecified, unspecified eye: H18.609

## 2021-01-26 HISTORY — PX: KNEE ARTHROSCOPY WITH MEDIAL COLLATERAL LIGAMENT RECONSTRUCTION: SHX5650

## 2021-01-26 HISTORY — PX: KNEE ARTHROSCOPY WITH ANTERIOR CRUCIATE LIGAMENT (ACL) REPAIR: SHX5644

## 2021-01-26 LAB — POCT PREGNANCY, URINE: Preg Test, Ur: NEGATIVE

## 2021-01-26 SURGERY — KNEE ARTHROSCOPY WITH ANTERIOR CRUCIATE LIGAMENT (ACL) REPAIR
Anesthesia: General | Site: Knee | Laterality: Left

## 2021-01-26 MED ORDER — PROMETHAZINE HCL 25 MG/ML IJ SOLN: 6.2500 mg | INTRAMUSCULAR | Status: DC | PRN

## 2021-01-26 MED ORDER — BUPIVACAINE-EPINEPHRINE (PF) 0.5% -1:200000 IJ SOLN
INTRAMUSCULAR | Status: DC | PRN
  Administered 2021-01-26: 15 mL via PERINEURAL

## 2021-01-26 MED ORDER — FENTANYL CITRATE (PF) 100 MCG/2ML IJ SOLN
INTRAMUSCULAR | Status: AC
  Filled 2021-01-26: qty 2

## 2021-01-26 MED ORDER — ONDANSETRON HCL 4 MG/2ML IJ SOLN
INTRAMUSCULAR | Status: AC
  Filled 2021-01-26: qty 2

## 2021-01-26 MED ORDER — ACETAMINOPHEN 500 MG PO TABS
1000.0000 mg | ORAL_TABLET | Freq: Once | ORAL | Status: AC
  Administered 2021-01-26: 1000 mg via ORAL

## 2021-01-26 MED ORDER — OXYCODONE HCL 5 MG PO TABS
5.0000 mg | ORAL_TABLET | Freq: Once | ORAL | Status: AC | PRN
  Administered 2021-01-26: 5 mg via ORAL

## 2021-01-26 MED ORDER — MIDAZOLAM HCL 2 MG/2ML IJ SOLN
2.0000 mg | Freq: Once | INTRAMUSCULAR | Status: AC
  Administered 2021-01-26: 2 mg via INTRAVENOUS

## 2021-01-26 MED ORDER — PROPOFOL 10 MG/ML IV BOLUS
INTRAVENOUS | Status: DC | PRN
Start: 2021-01-26 — End: 2021-01-26
  Administered 2021-01-26: 200 mg via INTRAVENOUS

## 2021-01-26 MED ORDER — ACETAMINOPHEN 500 MG PO TABS
ORAL_TABLET | ORAL | Status: AC
  Filled 2021-01-26: qty 2

## 2021-01-26 MED ORDER — DEXAMETHASONE SODIUM PHOSPHATE 10 MG/ML IJ SOLN
INTRAMUSCULAR | Status: DC | PRN
  Administered 2021-01-26: 5 mg via INTRAVENOUS

## 2021-01-26 MED ORDER — PROPOFOL 10 MG/ML IV BOLUS
INTRAVENOUS | Status: AC
  Filled 2021-01-26: qty 20

## 2021-01-26 MED ORDER — OXYCODONE HCL 5 MG PO TABS: ORAL_TABLET | ORAL | 0 refills | Status: AC

## 2021-01-26 MED ORDER — HYDROMORPHONE HCL 1 MG/ML IJ SOLN
INTRAMUSCULAR | Status: AC
  Filled 2021-01-26: qty 0.5

## 2021-01-26 MED ORDER — DEXAMETHASONE SODIUM PHOSPHATE 10 MG/ML IJ SOLN
INTRAMUSCULAR | Status: AC
  Filled 2021-01-26: qty 1

## 2021-01-26 MED ORDER — SODIUM CHLORIDE 0.9 % IR SOLN
Status: DC | PRN
  Administered 2021-01-26: 9000 mL

## 2021-01-26 MED ORDER — GABAPENTIN 100 MG PO CAPS: 100.0000 mg | ORAL_CAPSULE | Freq: Three times a day (TID) | ORAL | 0 refills | Status: AC

## 2021-01-26 MED ORDER — DEXMEDETOMIDINE HCL IN NACL 200 MCG/50ML IV SOLN
INTRAVENOUS | Status: DC | PRN
  Administered 2021-01-26 (×2): 8 ug via INTRAVENOUS

## 2021-01-26 MED ORDER — HYDROMORPHONE HCL 1 MG/ML IJ SOLN
0.2500 mg | INTRAMUSCULAR | Status: DC | PRN
  Administered 2021-01-26 (×4): 0.5 mg via INTRAVENOUS

## 2021-01-26 MED ORDER — LACTATED RINGERS IV SOLN: INTRAVENOUS | Status: DC

## 2021-01-26 MED ORDER — LIDOCAINE 2% (20 MG/ML) 5 ML SYRINGE
INTRAMUSCULAR | Status: AC
  Filled 2021-01-26: qty 5

## 2021-01-26 MED ORDER — FENTANYL CITRATE (PF) 100 MCG/2ML IJ SOLN
100.0000 ug | Freq: Once | INTRAMUSCULAR | Status: AC
  Administered 2021-01-26: 50 ug via INTRAVENOUS

## 2021-01-26 MED ORDER — ONDANSETRON HCL 4 MG/2ML IJ SOLN
INTRAMUSCULAR | Status: DC | PRN
  Administered 2021-01-26: 4 mg via INTRAVENOUS

## 2021-01-26 MED ORDER — OXYCODONE HCL 5 MG PO TABS
ORAL_TABLET | ORAL | Status: AC
  Filled 2021-01-26: qty 1

## 2021-01-26 MED ORDER — ACETAMINOPHEN 500 MG PO TABS: 1000.0000 mg | ORAL_TABLET | Freq: Three times a day (TID) | ORAL | 0 refills | Status: AC

## 2021-01-26 MED ORDER — CLONIDINE HCL (ANALGESIA) 100 MCG/ML EP SOLN
EPIDURAL | Status: DC | PRN
  Administered 2021-01-26: 100 ug

## 2021-01-26 MED ORDER — OXYCODONE HCL 5 MG/5ML PO SOLN: 5.0000 mg | Freq: Once | ORAL | Status: AC | PRN

## 2021-01-26 MED ORDER — VANCOMYCIN HCL 1000 MG IV SOLR
INTRAVENOUS | Status: DC | PRN
  Administered 2021-01-26: 1000 mg via TOPICAL

## 2021-01-26 MED ORDER — FENTANYL CITRATE (PF) 100 MCG/2ML IJ SOLN
INTRAMUSCULAR | Status: DC | PRN
Start: 2021-01-26 — End: 2021-01-26
  Administered 2021-01-26 (×3): 50 ug via INTRAVENOUS
  Administered 2021-01-26: 25 ug via INTRAVENOUS
  Administered 2021-01-26 (×2): 50 ug via INTRAVENOUS
  Administered 2021-01-26: 25 ug via INTRAVENOUS

## 2021-01-26 MED ORDER — MIDAZOLAM HCL 2 MG/2ML IJ SOLN
INTRAMUSCULAR | Status: AC
  Filled 2021-01-26: qty 2

## 2021-01-26 MED ORDER — DICLOFENAC SODIUM 75 MG PO TBEC: 75.0000 mg | DELAYED_RELEASE_TABLET | Freq: Two times a day (BID) | ORAL | 0 refills | Status: DC

## 2021-01-26 MED ORDER — SCOPOLAMINE 1 MG/3DAYS TD PT72
1.0000 | MEDICATED_PATCH | Freq: Once | TRANSDERMAL | Status: DC
  Administered 2021-01-26: 1.5 mg via TRANSDERMAL

## 2021-01-26 MED ORDER — CEFAZOLIN SODIUM-DEXTROSE 2-4 GM/100ML-% IV SOLN
2.0000 g | INTRAVENOUS | Status: AC
  Administered 2021-01-26: 2 g via INTRAVENOUS

## 2021-01-26 MED ORDER — LIDOCAINE HCL (CARDIAC) PF 100 MG/5ML IV SOSY
PREFILLED_SYRINGE | INTRAVENOUS | Status: DC | PRN
  Administered 2021-01-26: 100 mg via INTRAVENOUS

## 2021-01-26 MED ORDER — SCOPOLAMINE 1 MG/3DAYS TD PT72
MEDICATED_PATCH | TRANSDERMAL | Status: AC
  Filled 2021-01-26: qty 1

## 2021-01-26 MED ORDER — ONDANSETRON HCL 4 MG PO TABS: 4.0000 mg | ORAL_TABLET | Freq: Three times a day (TID) | ORAL | 0 refills | Status: AC | PRN

## 2021-01-26 MED ORDER — CEFAZOLIN SODIUM-DEXTROSE 2-4 GM/100ML-% IV SOLN
INTRAVENOUS | Status: AC
  Filled 2021-01-26: qty 100

## 2021-01-26 MED ORDER — ASPIRIN 81 MG PO CHEW
81.0000 mg | CHEWABLE_TABLET | Freq: Two times a day (BID) | ORAL | 0 refills | Status: AC
Start: 2021-01-26 — End: 2021-03-09

## 2021-01-26 SURGICAL SUPPLY — 80 items
ANCH SUT 5 FBRTK 2.6 KNTLS SLF (Anchor) ×2 IMPLANT
ANCH SUT FBRTK 1.3 2 TPE (Anchor) ×1 IMPLANT
ANCHOR FBRTK 2.6 SUTURETAP 1.3 (Anchor) ×1 IMPLANT
ANCHOR SUT FBRTK 2.6 SP #5 (Anchor) ×2 IMPLANT
ANCHOR TIGHTROPE II 20 W/IB (Anchor) ×1 IMPLANT
APL PRP STRL LF DISP 70% ISPRP (MISCELLANEOUS) ×1
BLADE AVERAGE 25X9 (BLADE) ×2 IMPLANT
BLADE SHAVER BONE 5.0X13 (MISCELLANEOUS) ×2 IMPLANT
BLADE SURG 10 STRL SS (BLADE) ×2 IMPLANT
BLADE SURG 15 STRL LF DISP TIS (BLADE) ×1 IMPLANT
BLADE SURG 15 STRL SS (BLADE) ×2
BNDG ELASTIC 6X5.8 VLCR STR LF (GAUZE/BANDAGES/DRESSINGS) ×1 IMPLANT
BONE TUNNEL PLUG CANNULATED (MISCELLANEOUS) ×2 IMPLANT
BURR OVAL 8 FLU 4.0X13 (MISCELLANEOUS) IMPLANT
CHLORAPREP W/TINT 26 (MISCELLANEOUS) ×2 IMPLANT
COOLER ICEMAN CLASSIC (MISCELLANEOUS) ×2 IMPLANT
COVER BACK TABLE 60X90IN (DRAPES) ×2 IMPLANT
CUFF TOURN SGL QUICK 34 (TOURNIQUET CUFF) ×2
CUFF TRNQT CYL 34X4.125X (TOURNIQUET CUFF) IMPLANT
DECANTER SPIKE VIAL GLASS SM (MISCELLANEOUS) IMPLANT
DISSECTOR 3.5MM X 13CM CVD (MISCELLANEOUS) IMPLANT
DISSECTOR 4.0MMX13CM CVD (MISCELLANEOUS) ×2 IMPLANT
DRAPE IMP U-DRAPE 54X76 (DRAPES) IMPLANT
DRAPE U-SHAPE 47X51 STRL (DRAPES) ×2 IMPLANT
DRAPE-T ARTHROSCOPY W/POUCH (DRAPES) ×2 IMPLANT
ELECT REM PT RETURN 9FT ADLT (ELECTROSURGICAL) ×2
ELECTRODE REM PT RTRN 9FT ADLT (ELECTROSURGICAL) ×1 IMPLANT
GAUZE SPONGE 4X4 12PLY STRL (GAUZE/BANDAGES/DRESSINGS) ×4 IMPLANT
GLOVE SRG 8 PF TXTR STRL LF DI (GLOVE) ×1 IMPLANT
GLOVE SURG ENC MOIS LTX SZ6.5 (GLOVE) ×4 IMPLANT
GLOVE SURG LTX SZ8 (GLOVE) ×3 IMPLANT
GLOVE SURG UNDER POLY LF SZ6.5 (GLOVE) ×2 IMPLANT
GLOVE SURG UNDER POLY LF SZ8 (GLOVE) ×2
GOWN STRL REUS W/ TWL LRG LVL3 (GOWN DISPOSABLE) ×2 IMPLANT
GOWN STRL REUS W/TWL LRG LVL3 (GOWN DISPOSABLE) ×4
GOWN STRL REUS W/TWL XL LVL3 (GOWN DISPOSABLE) ×2 IMPLANT
GRAFT ACHILLES CALC BNE BLCK (Bone Implant) IMPLANT
GRAFT ACHILLES TENDON (Bone Implant) ×2 IMPLANT
GRAFT TISS ANT TIB TNDN (Tissue) IMPLANT
GRAFT TISS SEMITEND 4-8 (Bone Implant) IMPLANT
GUIDEPIN FLEX PATHFINDER 2.4MM (WIRE) IMPLANT
IMMOBILIZER KNEE 22 UNIV (SOFTGOODS) IMPLANT
IMMOBILIZER KNEE 24 THIGH 36 (MISCELLANEOUS) IMPLANT
IMMOBILIZER KNEE 24 UNIV (MISCELLANEOUS)
IV NS IRRIG 3000ML ARTHROMATIC (IV SOLUTION) ×9 IMPLANT
KIT TRANSTIBIAL (DISPOSABLE) ×2 IMPLANT
KNIFE GRAFT ACL 10MM 5952 (MISCELLANEOUS) ×2 IMPLANT
KNIFE GRAFT ACL 9MM (MISCELLANEOUS) IMPLANT
MANIFOLD NEPTUNE II (INSTRUMENTS) ×2 IMPLANT
NDL MAYO TROCAR (NEEDLE) IMPLANT
NDL SAFETY ECLIPSE 18X1.5 (NEEDLE) ×1 IMPLANT
NEEDLE HYPO 18GX1.5 SHARP (NEEDLE) ×2
NEEDLE MAYO TROCAR (NEEDLE) ×2 IMPLANT
NS IRRIG 1000ML POUR BTL (IV SOLUTION) ×2 IMPLANT
PACK ARTHROSCOPY DSU (CUSTOM PROCEDURE TRAY) ×2 IMPLANT
PACK BASIN DAY SURGERY FS (CUSTOM PROCEDURE TRAY) ×2 IMPLANT
PAD COLD SHLDR WRAP-ON (PAD) ×2 IMPLANT
PENCIL SMOKE EVACUATOR (MISCELLANEOUS) ×1 IMPLANT
PORT APPOLLO RF 90DEGREE MULTI (SURGICAL WAND) ×1 IMPLANT
SCREW FT BIOCOMP 10X30 (Screw) ×1 IMPLANT
SCREW INTERFERENCE FT BC 9X20 (Screw) ×1 IMPLANT
SCREW LO PRO 25MM (Screw) ×1 IMPLANT
SLEEVE SCD COMPRESS KNEE MED (STOCKING) ×1 IMPLANT
SPONGE T-LAP 4X18 ~~LOC~~+RFID (SPONGE) ×2 IMPLANT
STRIP CLOSURE SKIN 1/2X4 (GAUZE/BANDAGES/DRESSINGS) ×3 IMPLANT
SUT FIBERWIRE #2 38 T-5 BLUE (SUTURE)
SUT MNCRL AB 4-0 PS2 18 (SUTURE) ×2 IMPLANT
SUT VIC AB 0 CT1 27 (SUTURE) ×4
SUT VIC AB 0 CT1 27XBRD ANBCTR (SUTURE) ×1 IMPLANT
SUT VIC AB 3-0 SH 27 (SUTURE) ×4
SUT VIC AB 3-0 SH 27X BRD (SUTURE) ×1 IMPLANT
SUTURE FIBERWR #2 38 T-5 BLUE (SUTURE) IMPLANT
SYR 5ML LL (SYRINGE) ×2 IMPLANT
TENDON ANTERIOR TIBIALIS (Tissue) ×2 IMPLANT
TENDON SEMI-TENDINOSUS (Bone Implant) ×2 IMPLANT
TOWEL GREEN STERILE FF (TOWEL DISPOSABLE) ×4 IMPLANT
TUBE CONNECTING 20X1/4 (TUBING) ×2 IMPLANT
TUBE SUCTION HIGH CAP CLEAR NV (SUCTIONS) ×2 IMPLANT
TUBING ARTHROSCOPY IRRIG 16FT (MISCELLANEOUS) ×2 IMPLANT
WASHER SPIKED 18MM (Orthopedic Implant) ×1 IMPLANT

## 2021-01-26 NOTE — Discharge Instructions (Addendum)
Ramond Marrow MD, MPH Alfonse Alpers, PA-C [facility] [address] [place], [address] [phone] (tel)   [phone] (fax)   POST-OPERATIVE INSTRUCTIONS - KNEE  WOUND CARE - You may remove the Operative Dressing on Post-Op Day #3 (72hrs after surgery).   - Alternatively if you would like you can leave dressing on until follow-up if within 7-8 days but keep it dry. - Leave steri-strips in place until they fall off on their own, usually 2 weeks postop. - An ACE wrap may be used to control swelling, do not wrap this too tight.  If the initial ACE wrap feels too tight you may loosen it. - There may be a small amount of fluid/bleeding leaking at the surgical site.  - This is normal; the knee is filled with fluid during the procedure and can leak for 24-48hrs after surgery.  - You may change/reinforce the bandage as needed.  - Use the Cryocuff or Ice as often as possible for the first 7 days, then as needed for pain relief. Always keep a towel, ACE wrap or other barrier between the cooling unit and your skin.  - You may shower on Post-Op Day #3. Gently pat the area dry. Do not soak the knee in water or submerge it.  - Do not go swimming in the pool or ocean until 4 weeks after surgery or when otherwise instructed.  Keep incisions as dry as possible.   BRACE/AMBULATION - You will be placed in a brace post-operatively.  - Wear your brace at all times until follow-up. (Including when you sleep!) - You may remove for hygiene ONLY. -           Use crutches or a walker to help you ambulate -           Touch-down weight bearing: when you stand or walk, you may only touch your toes to the floor for balance -           Do NOT put any body weight on your leg  - A physical therapy referral was sent to [facility]  - Please make a PT appointment as soon as possible, if you have not already done so - Physical therapy will be very important for your recovery    REGIONAL  ANESTHESIA (NERVE BLOCKS) - The anesthesia team may have performed a nerve block for you if safe in the setting of your care.  This is a great tool used to minimize pain.  Typically the block may start wearing off overnight.  This can be a challenging period but please utilize your as needed pain medications to try and manage this period and know it will be a brief transition as the nerve block wears completely   POST-OP MEDICATIONS - Multimodal approach to pain control - In general your pain will be controlled with a combination of substances.  Prescriptions unless otherwise discussed are electronically sent to your pharmacy.  This is a carefully made plan we use to minimize narcotic use.     - Diclofenac - Anti-inflammatory medication taken on a scheduled basis - Acetaminophen - Non-narcotic pain medicine taken on a scheduled basis  - Gabapentin - this is a medication to help with nerve based pain, take on a scheduled basis - Oxycodone - This is a strong narcotic, to be used only on an "as needed" basis for SEVERE pain. - Aspirin 81mg  - This medicine is used to minimize the risk of blood clots after surgery. -  Zofran - take as needed for nausea  FOLLOW-UP   Please call the office to schedule a follow-up appointment for your incision check, 7-10 days post-operatively.  IF YOU HAVE ANY QUESTIONS, PLEASE FEEL FREE TO CALL OUR OFFICE.   HELPFUL INFORMATION  - If you had a block, it will wear off between 8-24 hrs postop typically.  This is period when your pain may go from nearly zero to the pain you would have had post-op without the block.  This is an abrupt transition but nothing dangerous is happening.   *You may take an extra dose of narcotic when this happens.*   Keep your leg elevated to decrease swelling, which will then in turn decrease your pain. I would elevate the foot of your bed by putting a couple of couch pillows between your mattress and box spring. I would not keep pillow  directly under your ankle.  - Do not sleep with a pillow behind your knee even if it is more comfortable as this may make it harder to get your knee fully straight long term.   There will be MORE swelling on days 1-3 than there is on the day of surgery.  This also is normal. The swelling will decrease with the anti-inflammatory medication, ice and keeping it elevated. The swelling will make it more difficult to bend your knee. As the swelling goes down your motion will become easier   You may develop swelling and bruising that extends from your knee down to your calf and perhaps even to your foot over the next week. Do not be alarmed. This too is normal, and it is due to gravity   There may be some numbness adjacent to the incision site. This may last for 6-12 months or longer in some patients and is expected.   You may return to sedentary work/school in the next couple of days when you feel up to it. You will need to keep your leg elevated as much as possible    You should wean off your narcotic medicines as soon as you are able.    -  You should be off or using minimal narcotics before their first postop appointment   We suggest you use the pain medication the first night prior to going to bed, in order to ease any pain when the anesthesia wears off. You should avoid taking pain medications on an empty stomach as it will make you nauseous.   Do not drink alcoholic beverages or take illicit drugs when taking pain medications.   It is against the law to drive while taking narcotics. You cannot drive if your Right leg is in brace locked in extension.   Pain medication may make you constipated.  Below are a few solutions to try in this order:  o Decrease the amount of pain medication if you aren't having pain.  o Drink lots of decaffeinated fluids.  o Drink prune juice and/or each dried prunes   o If the first 3 don't work start with additional solutions  o Take Colace - an  over-the-counter stool softener  o Take Senokot - an over-the-counter laxative  o Take Miralax - a stronger over-the-counter laxative   For more information including helpful videos and documents visit our website:   [email or web address]  *You had 1000 mg of Tylenol at 10:20 am  Post Anesthesia Home Care Instructions  Activity: Get plenty of rest for the remainder of the day. A responsible individual must stay with you for  24 hours following the procedure.  For the next 24 hours, DO NOT: -Drive a car -Advertising copywriter -Drink alcoholic beverages -Take any medication unless instructed by your physician -Make any legal decisions or sign important papers.  Meals: Start with liquid foods such as gelatin or soup. Progress to regular foods as tolerated. Avoid greasy, spicy, heavy foods. If nausea and/or vomiting occur, drink only clear liquids until the nausea and/or vomiting subsides. Call your physician if vomiting continues.  Special Instructions/Symptoms: Your throat may feel dry or sore from the anesthesia or the breathing tube placed in your throat during surgery. If this causes discomfort, gargle with warm salt water. The discomfort should disappear within 24 hours.  If you had a scopolamine patch placed behind your ear for the management of post- operative nausea and/or vomiting:  1. The medication in the patch is effective for 72 hours, after which it should be removed.  Wrap patch in a tissue and discard in the trash. Wash hands thoroughly with soap and water. 2. You may remove the patch earlier than 72 hours if you experience unpleasant side effects which may include dry mouth, dizziness or visual disturbances. 3. Avoid touching the patch. Wash your hands with soap and water after contact with the patch.     Regional Anesthesia Blocks  1. Numbness or the inability to move the "blocked" extremity may last from 3-48 hours after placement. The  length of time depends on the medication injected and your individual response to the medication. If the numbness is not going away after 48 hours, call your surgeon.  2. The extremity that is blocked will need to be protected until the numbness is gone and the  Strength has returned. Because you cannot feel it, you will need to take extra care to avoid injury. Because it may be weak, you may have difficulty moving it or using it. You may not know what position it is in without looking at it while the block is in effect.  3. For blocks in the legs and feet, returning to weight bearing and walking needs to be done carefully. You will need to wait until the numbness is entirely gone and the strength has returned. You should be able to move your leg and foot normally before you try and bear weight or walk. You will need someone to be with you when you first try to ensure you do not fall and possibly risk injury.  4. Bruising and tenderness at the needle site are common side effects and will resolve in a few days.  5. Persistent numbness or new problems with movement should be communicated to the surgeon or the [facility] [phone] [facility] [phone]).

## 2021-01-26 NOTE — Anesthesia Preprocedure Evaluation (Addendum)
Anesthesia Evaluation  Patient identified by MRN, date of birth, ID band Patient awake    Reviewed: Allergy & Precautions, NPO status , Patient's Chart, lab work & pertinent test results  History of Anesthesia Complications Negative for: history of anesthetic complications  Airway Mallampati: II  TM Distance: >3 FB Neck ROM: Full    Dental no notable dental hx.    Pulmonary Current Smoker,    Pulmonary exam normal        Cardiovascular negative cardio ROS Normal cardiovascular exam     Neuro/Psych Anxiety Depression MS    GI/Hepatic negative GI ROS, Neg liver ROS,   Endo/Other  negative endocrine ROS  Renal/GU negative Renal ROS  negative genitourinary   Musculoskeletal LEFT KNEE MCL ACL TEARS, MEDIAL MENISCUS BUCKET HANDLE TEAR   Abdominal   Peds  Hematology negative hematology ROS (+)   Anesthesia Other Findings Day of surgery medications reviewed with patient.  Reproductive/Obstetrics negative OB ROS                            Anesthesia Physical Anesthesia Plan  ASA: 2  Anesthesia Plan: General   Post-op Pain Management: Tylenol PO (pre-op) and Regional block   Induction: Intravenous  PONV Risk Score and Plan: 3 and Treatment may vary due to age or medical condition, Ondansetron, Dexamethasone, Midazolam and Scopolamine patch - Pre-op  Airway Management Planned: LMA  Additional Equipment: None  Intra-op Plan:   Post-operative Plan: Extubation in OR  Informed Consent: I have reviewed the patients History and Physical, chart, labs and discussed the procedure including the risks, benefits and alternatives for the proposed anesthesia with the patient or authorized representative who has indicated his/her understanding and acceptance.     Dental advisory given  Plan Discussed with: CRNA  Anesthesia Plan Comments:        Anesthesia Quick Evaluation

## 2021-01-26 NOTE — Progress Notes (Signed)
Assisted Dr. Howze with left, ultrasound guided, adductor canal block. Side rails up, monitors on throughout procedure. See vital signs in flow sheet. Tolerated Procedure well.  

## 2021-01-26 NOTE — Anesthesia Postprocedure Evaluation (Signed)
Anesthesia Post Note  Patient: Patricia Jensen  Procedure(s) Performed: KNEE ARTHROSCOPY WITH ANTERIOR CRUCIATE LIGAMENT (ACL) REPAIR (Left: Knee) KNEE ARTHROSCOPY WITH MEDIAL COLLATERAL LIGAMENT RECONSTRUCTION WITH MEDIAL PARTIAL MENISECTOMY (Left: Knee)     Patient location during evaluation: PACU Anesthesia Type: General Level of consciousness: awake and alert Pain management: pain level controlled Vital Signs Assessment: post-procedure vital signs reviewed and stable Respiratory status: spontaneous breathing, nonlabored ventilation, respiratory function stable and patient connected to nasal cannula oxygen Cardiovascular status: blood pressure returned to baseline and stable Postop Assessment: no apparent nausea or vomiting Anesthetic complications: no   No notable events documented.  Last Vitals:  Vitals:   01/26/21 1415 01/26/21 1430  BP: (!) 141/92 (!) 132/101  Pulse: 84 83  Resp: 19 19  Temp:    SpO2: 100% 100%    Last Pain:  Vitals:   01/26/21 1404  TempSrc:   PainSc: 10-Worst pain ever                 Daune Divirgilio S

## 2021-01-26 NOTE — Interval H&P Note (Signed)
All questions answered, patient wants to proceed with procedure. ? ?

## 2021-01-26 NOTE — Op Note (Signed)
Orthopaedic Surgery Operative Note (CSN: [id number])  Patricia Jensen  [birth year] Date of Surgery: 01/26/2021   Diagnoses:  Left knee ACL and MCL rupture with bucket-handle medial meniscal tear and baseline patellofemoral instability  Procedure: Left allograft ACL reconstruction with tibialis anterior Left MCL reconstruction Manipulation under anesthesia Partial medial meniscectomy    Operative Finding Exam under anesthesia: Patient had 30 degrees of flexion and relatively fixed contracture though she was able to get to full extension.  We performed a manipulation and were able to get full motion.  The patient did have clear instability to valgus stress and a grossly unstable Lachman.   Suprapatellar pouch: Normal Patellofemoral Compartment: Mild lateral tracking patella but today was not particularly unstable.  2 quadrants lateral translation. Medial Compartment: Drive-through sign, bucket-handle medial meniscus tear with very little peripheral meniscus intact.  We were able to reduce the meniscus however it is contracted and was complex in its tear.  It was unable to be repaired.  Near complete medial meniscectomy performed with 80% total meniscal volume resected. Lateral Compartment: Normal Intercondylar Notch: Complete ACL rupture.  Successful completion of the planned procedure.  Patient had significant arthrosis at the beginning of the case and is at extraordinarily high likelihood of continued instability.  Based on this we were worried about the possibility of trying to perform an MPFL reconstruction in addition to her MCL and ACL as we felt that this would almost assuredly make her very stiff.  If the patient continues to have instability she may be a candidate for an MPFL reconstruction with an onlay technique later however we will address this at a later date.  She is at extremely high risk of arthrofibrosis.  Post-operative plan: The patient will be nonweightbearing with a hinged  knee brace locked in extension.  The patient will be discharged home.  DVT prophylaxis aspirin.  Pain control with PRN pain medication preferring oral medicines.  Follow up plan will be scheduled in approximately 7 days for incision check and XR.  Post-Op Diagnosis: Same Surgeons:Primary: Bjorn Pippin, MD Assistants:Caroline McBane PA-C Location: [facility] OR ROOM 6 Anesthesia: General with adductor canal Antibiotics: Ancef 2 g with local vancomycin powder 1 g at the surgical site Tourniquet time:  Total Tourniquet Time Documented: Thigh (Left) - 97 minutes Total: Thigh (Left) - 97 minutes  Estimated Blood Loss: Minimal Complications: None Specimens: None Implants: Implant Name Type Inv. Item Serial No. Manufacturer Lot No. LRB No. Used Action  ANCHOR SUT FBRTK 2.6 SP #5 - [id number] Anchor ANCHOR SUT FBRTK 2.6 SP #5  [employer] [id number] Left 2 Implanted  TENDON SEMI-TENDINOSUS - [id number] Bone Implant TENDON SEMI-TENDINOSUS [id number] [facility] [id number] Left 1 Implanted  TENDON ANTERIOR TIBIALIS - [id number] Tissue TENDON ANTERIOR TIBIALIS [id number] [employer] [id number] Left 1 Implanted  GRAFT ACHILLES TENDON - [id number] Bone Implant GRAFT ACHILLES TENDON [id number] [facility] [id number] Left 1 Implanted  ANCHOR TIGHTROPE II 20 W/IB - [id number] Anchor ANCHOR TIGHTROPE II 20 W/IB  [employer] [id number] Left 1 Implanted  ANCHOR FBRTK 2.6 SUTURETAP 1.3 - [id number] Anchor ANCHOR FBRTK 2.6 SUTURETAP 1.3  [employer] [id number] Left 1 Implanted  SCREW FT BIOCOMP 10X30 - [id number] Screw SCREW FT BIOCOMP 10X30  [employer] [id number] Left 1 Implanted  SCREW INTERFERENCE FT BC 9X20 - [id number] Screw SCREW INTERFERENCE FT BC 9X20  [employer] [id number] Left 1 Implanted  SCREW LO PRO - [id number] Screw SCREW LO PRO  [employer] [id number] Left 1 Implanted  WASHER  SPIKED - [id number] Orthopedic Implant WASHER SPIKED  [employer] [id number] Left 1 Implanted     Indications for Surgery:   Patricia Jensen is a 41 y.o. female with injury resulting in a knee dislocation with ACL and MCL injuries noted on MRI as well as a bucket-handle medial meniscus tear.  Due to the patient's insurance she was not approved for more urgent surgery and had a delay to surgery.  Benefits and risks of operative and nonoperative management were discussed prior to surgery with patient/guardian(s) and informed consent form was completed.  Specific risks including infection, need for additional surgery, stiffness, instability, continued patellofemoral instability.   Procedure:   The patient was identified properly. Informed consent was obtained and the surgical site was marked. The patient was taken up to suite where general anesthesia was induced. The patient was placed in the supine position with a post against the surgical leg and a nonsterile tourniquet applied. The surgical leg was then prepped and draped usual sterile fashion.  A standard surgical timeout was performed.  2 standard anterior portals were made and diagnostic arthroscopy performed. Please note the findings as noted above.  We initially looked at the medial meniscus and addressed its pathology.  We initially reduced it with a blunt trocar but unfortunately the meniscus was not able to be reduced back to its native position as it had contracted and was partially attenuated being locked and then notch.  At that point we decided it was appropriate for a partial meniscectomy.  We used a basket as well as a shaver to remove the unstable portion of the meniscus.  There is very little capsule to repair the meniscus to as well.  80% total meniscal volume was resected.  We cleared the joint and proceeded with our ACL.  We began by preparing a tibialis anterior allograft.  Unfortunately it was not big enough on its own at only 8 mm and were able to make a 10 mm graft by adding in a semitendinosus.  We prepared this by  whipstitching the ends and placing over the button from [employer].  This point we cleared the joint and remove the ACL stump.  We prepared the tibial and the femoral aperture positions and marked these.  We then used an low anterior medial portal to place our guidewire at the aperture double checking its position while the knee was in hyperflexion.  At that point we reamed to 10 mm on the femur.  We passed a passing stitch.  We turned our attention to the tibia and placed a 10 mm tunnel which we dilated to 11 in the standard position.  Were happy with our position.  We shuttled our passing stitch to the tibial tunnel and turned our attention to the MCL.  MCL Reconstruction: We began with a incision starting at the medial epicondyle and going down just distal to the pedis anserine [place].  We dissected through soft tissues bluntly take care to protect posterior structures including the saphenous nerve and vein.  We achieved hemostasis to be progressed were able to identify the distal insertion of the MCL.  There is a complete MCL rupture noted with significant soft tissue trauma in this area.  Joint line was palpated and were able to find the epicondyle within incision.  At this point we placed a 2.4 mm guidepin just proximal and posterior to the epicondyle and the MCL origin and used fluoroscopy to verify its position.  Once were happy with  this position we then placed another guidewire at the insertion on the tibia and checked for graft isometry which was appropriate with less than 2 mm of and isometry.  We then removed the tibial pin and reamed a 25 mm deep 10 mm tunnel into the femur.  It was cleared of soft tissue.  We had good walls on all sides of the tunnel.  We then prepped the graft in the form of allograft with a 10 mm bone block and the full length of the Achilles soft tissue itself.  We used a bone tamp to place the bone block which was 20 mm in length into our tunnel in the femur and it was  secured with a 9 x 20 mm biocomposite screw.  We then turned our attention to the fixation for the deep MCL as well as the superficial MCL along the tibia.  We placed 1 double loaded fiber tack anchors 1 cm proximal to the joint line on the femur and two 1 cm distal to the joint line on the tibia.    These were double loaded suture anchors in each limb was passed in a Mason-Allen type fashion to tension the graft as it was tied with the remaining limb as post.  Once all the limbs were passed we then began tying starting near the joint and progressing away from the joint as we proceeded.  We held the knee in slight varus with 20 degrees of flexion to avoid capturing the knee while tying these knots.  We then turned our attention to the distal most fixation.  We cleared an area distal to the pes tendons and used an arthrex soft tissue screw and washer construct to drill then place a cancellous screw holding tension on the graft as we tightened the screw.  The sutures were cut and we imbricated the remaining native MCL tissue into our graft.  We had a robust fixation and good stability to a valgus stress that was gently placed in the operating room at 0,30, and 60 degrees.  We turned attention back to the ACL.  A graft was shuttled and fixed on the femur with a tight rope and was confirmed to be on the lateral femoral cortex.  We then were able to shuttle our graft into the joint and had good tension and fixation on the femur.  We then cycled the knee repeatedly and were able to place a 10 x 30 mm bio composite screw.  Internal brace sutures were also used.  Final construct had no translation with Lachman and good stability with valgus stress.   Incisions closed with absorbable suture. The patient was awoken from general anesthesia and taken to the PACU in stable condition without complication.   Alfonse Alpers, PA-C, present and scrubbed throughout the case, critical for completion in a timely fashion,  and for retraction, instrumentation, closure.

## 2021-01-26 NOTE — Anesthesia Procedure Notes (Signed)
Anesthesia Regional Block: Adductor canal block   Pre-Anesthetic Checklist: , timeout performed,  Correct Patient, Correct Site, Correct Laterality,  Correct Procedure, Correct Position, site marked,  Risks and benefits discussed,  Pre-op evaluation,  At surgeon's request and post-op pain management  Laterality: Left  Prep: Maximum Sterile Barrier Precautions used, chloraprep       Needles:  Injection technique: Single-shot  Needle Type: Echogenic Stimulator Needle     Needle Length: 9cm  Needle Gauge: 22     Additional Needles:   Procedures:,,,, ultrasound used (permanent image in chart),,    Narrative:  Start time: 01/26/2021 10:52 AM End time: 01/26/2021 10:55 AM Injection made incrementally with aspirations every 5 mL.  Performed by: Personally  Anesthesiologist: Kaylyn Layer, MD  Additional Notes: Risks, benefits, and alternative discussed. Patient gave consent for procedure. Patient prepped and draped in sterile fashion. Sedation administered, patient remains easily responsive to voice. Relevant anatomy identified with ultrasound guidance. Local anesthetic given in 5cc increments with no signs or symptoms of intravascular injection. No pain or paraesthesias with injection. Patient monitored throughout procedure with signs of LAST or immediate complications. Tolerated well. Ultrasound image placed in chart.  Amalia Greenhouse, MD

## 2021-01-26 NOTE — Progress Notes (Signed)
Orthopedic Tech Progress Note Patient Details:  Patricia Jensen 25-May-1980 [id number]  Bledsoe brace called into [facility] @ 1016. Specified to be dropped off at [facility].   Patient ID: Patricia Jensen, female   DOB: [birth year], 41 y.o.   MRN: [id number]  Carin Primrose 01/26/2021, 10:22 AM

## 2021-01-26 NOTE — Transfer of Care (Signed)
Immediate Anesthesia Transfer of Care Note  Patient: Patricia Jensen  Procedure(s) Performed: KNEE ARTHROSCOPY WITH ANTERIOR CRUCIATE LIGAMENT (ACL) REPAIR (Left: Knee) KNEE ARTHROSCOPY WITH MEDIAL COLLATERAL LIGAMENT RECONSTRUCTION WITH MEDIAL MENISCUS REPAIR VS PARTIAL MENISECTOMY (Left: Knee)  Patient Location: PACU  Anesthesia Type:GA combined with regional for post-op pain  Level of Consciousness: awake, alert , oriented, drowsy and patient cooperative  Airway & Oxygen Therapy: Patient Spontanous Breathing and Patient connected to face mask oxygen  Post-op Assessment: Report given to RN and Post -op Vital signs reviewed and stable  Post vital signs: Reviewed and stable  Last Vitals:  Vitals Value Taken Time  BP    Temp    Pulse    Resp    SpO2      Last Pain:  Vitals:   01/26/21 1017  TempSrc: Oral  PainSc: 4          Complications: No notable events documented.

## 2021-01-26 NOTE — Anesthesia Procedure Notes (Signed)
Procedure Name: LMA Insertion Date/Time: 01/26/2021 11:45 AM Performed by: Lavonia Dana, CRNA Pre-anesthesia Checklist: Patient identified, Emergency Drugs available, Suction available and Patient being monitored Patient Re-evaluated:Patient Re-evaluated prior to induction Oxygen Delivery Method: Circle system utilized Preoxygenation: Pre-oxygenation with 100% oxygen Induction Type: IV induction Ventilation: Mask ventilation without difficulty LMA: LMA inserted LMA Size: 4.0 Number of attempts: 1 Airway Equipment and Method: Bite block Placement Confirmation: positive ETCO2 Tube secured with: Tape Dental Injury: Teeth and Oropharynx as per pre-operative assessment

## 2021-01-27 ENCOUNTER — Encounter (HOSPITAL_BASED_OUTPATIENT_CLINIC_OR_DEPARTMENT_OTHER): Payer: Self-pay | Admitting: Orthopaedic Surgery

## 2021-01-27 NOTE — Progress Notes (Signed)
Left message stating courtesy call and if any questions or concerns please call the doctors office.  

## 2021-01-30 ENCOUNTER — Telehealth: Payer: Self-pay

## 2021-01-30 NOTE — Telephone Encounter (Signed)
° °  Telephone encounter was:  Successful.  01/30/2021 Name: Patricia Jensen MRN: [id number] DOB: 11/28/1980  Patricia Jensen is a 41 y.o. year old female who is a primary care patient of Hartsell, Marga Hoots, MD . The community resource team was consulted for assistance with  PCP Search, Transportation, Medical Equipment and [place] guide performed the following interventions: Per last call: I was awaiting confirmation e-mail was received. Pt did not respond by e-mail, therefore a call was placed to inquire if e-mail was received and if pt had any questions or concerns. Pt stated e-mail received and no further assistance needed at this time.  Follow Up Plan:  No further follow up planned at this time. The patient has been provided with needed resources. Pt will call me or e-mail if assistance is needed.  [employer] [facility]  [place], [employer] [place]  Main Phone: [phone]   E-mail: [email or web address]  Website: [email or web address]

## 2021-02-03 ENCOUNTER — Ambulatory Visit (HOSPITAL_COMMUNITY)
Admission: RE | Admit: 2021-02-03 | Discharge: 2021-02-03 | Disposition: A | Discharge status: Home / Self Care | Payer: 59 | Source: Ambulatory Visit | Attending: Orthopaedic Surgery | Admitting: Orthopaedic Surgery

## 2021-02-03 ENCOUNTER — Other Ambulatory Visit (HOSPITAL_COMMUNITY): Payer: Self-pay | Admitting: Orthopaedic Surgery

## 2021-02-03 ENCOUNTER — Other Ambulatory Visit: Payer: Self-pay

## 2021-02-03 DIAGNOSIS — M79605 Pain in left leg: Secondary | ICD-10-CM

## 2021-02-03 DIAGNOSIS — M7989 Other specified soft tissue disorders: Secondary | ICD-10-CM | POA: Insufficient documentation

## 2021-02-03 NOTE — Progress Notes (Signed)
Lower extremity venous LT study completed.   Please see CV Proc for preliminary results.   Hettie Roselli, RDMS, RVT  

## 2021-02-09 ENCOUNTER — Ambulatory Visit: Payer: 59 | Attending: Orthopaedic Surgery

## 2021-02-09 ENCOUNTER — Other Ambulatory Visit: Payer: Self-pay

## 2021-02-09 DIAGNOSIS — M25562 Pain in left knee: Secondary | ICD-10-CM | POA: Diagnosis not present

## 2021-02-09 DIAGNOSIS — R2689 Other abnormalities of gait and mobility: Secondary | ICD-10-CM

## 2021-02-09 DIAGNOSIS — M6281 Muscle weakness (generalized): Secondary | ICD-10-CM

## 2021-02-09 NOTE — Therapy (Signed)
OUTPATIENT PHYSICAL THERAPY LOWER EXTREMITY EVALUATION   Patient Name: Patricia Jensen MRN: [id number] DOB:1980-05-27, 41 y.o., female Today's Date: 02/09/2021   PT End of Session - 02/09/21 0957     Visit Number 1    Number of Visits 25    Date for PT Re-Evaluation 05/04/21    Authorization Type [employer]    Authorization Time Period initial auth submitted    PT Start Time 0958    PT Stop Time 1044    PT Time Calculation (min) 46 min    Activity Tolerance Patient tolerated treatment well    Behavior During Therapy WFL for tasks assessed/performed             Past Medical History:  Diagnosis Date   Anxiety    Depression    Keratoconus    MS (multiple sclerosis) (HCC)    Pregnancy induced hypertension    Past Surgical History:  Procedure Laterality Date   KNEE ARTHROSCOPY WITH ANTERIOR CRUCIATE LIGAMENT (ACL) REPAIR Left 01/26/2021   Procedure: KNEE ARTHROSCOPY WITH ANTERIOR CRUCIATE LIGAMENT (ACL) REPAIR;  Surgeon: Bjorn Pippin, MD;  Location: [facility];  Service: Orthopedics;  Laterality: Left;   KNEE ARTHROSCOPY WITH MEDIAL COLLATERAL LIGAMENT RECONSTRUCTION Left 01/26/2021   Procedure: KNEE ARTHROSCOPY WITH MEDIAL COLLATERAL LIGAMENT RECONSTRUCTION WITH MEDIAL PARTIAL MENISECTOMY;  Surgeon: Bjorn Pippin, MD;  Location: [facility];  Service: Orthopedics;  Laterality: Left;   WISDOM TOOTH EXTRACTION     Patient Active Problem List   Diagnosis Date Noted   Abnormal laboratory test 04/21/2018   Multiple sclerosis (HCC) 10/25/2017   Low back pain 12/09/2013   Pregnancy induced hypertension    Anxiety    MS (multiple sclerosis) (HCC)    Lower extremity numbness 02/11/2012   Tobacco abuse 02/11/2012   Depression 01/23/2011   SVD (spontaneous vaginal delivery) 01/22/2011    PCP: Shirlean Schlein, MD  REFERRING PROVIDER: Bjorn Pippin, MD  REFERRING DIAG:  Left allograft ACL reconstruction with tibialis anterior, MCL  reconstruction Manipulation  ander anesthesia, Partial medial meniscectomy1/05/2021  THERAPY DIAG:  Acute pain of left knee - Plan: PT plan of care cert/re-cert  Muscle weakness (generalized) - Plan: PT plan of care cert/re-cert  Other abnormalities of gait and mobility - Plan: PT plan of care cert/re-cert  ONSET DATE: DOS: 01/26/2021  SUBJECTIVE:   SUBJECTIVE STATEMENT: Pt presents to PT s/p s/p L ACL reconstruction w/ tibialis anterior, MCL reconstruction, partial medial meniscectomy and manipulation under anesthesia. She notes that she has been in pretty significant pain, but has started to be more well controlled with tylenol. Has noticed some continued swelling, does note benefit with RICE. Pt has PMH of multiple sclerosis, has not had an exacerbation in approximately one year, only has residual distal L foot numbness. Initial twisting injury on 12/24/2021 with resultant indication for surgery.   PERTINENT HISTORY: Pt is s/p L ACL reconstruction w/ tibialis anterior, MCL reconstruction, partial medial meniscectomy and manipulation under anesthesia  PAIN:  Are you having pain? Yes NPRS scale: 3/10 (7/10 at worst) Pain location: L Knee PAIN TYPE: aching Pain description: intermittent  Aggravating factors: transfers Relieving factors: ice, tylenol  PRECAUTIONS: Knee; 0-6wks TDWB; Left Knee 0-90deg with hinged knee brace locked in extension  WEIGHT BEARING RESTRICTIONS Yes TDWB  FALLS:  Has patient fallen in last 6 months? Yes, Number of falls: a few falls d/t L knee instability prior to surgery  LIVING ENVIRONMENT: Lives with: lives with  their family Lives in: House/apartment Stairs: Yes; does not have to use stairs, has ramp entry Has following equipment at home: Crutches and hinged knee brace  OCCUPATION: no currently working  PLOF: Independent and Independent with basic ADLs  PATIENT GOALS:    OBJECTIVE:   DIAGNOSTIC FINDINGS:  CLINICAL DATA:  Twisting injury left  knee after fall.   EXAM: CT OF THE left KNEE WITHOUT CONTRAST   TECHNIQUE: Multidetector CT imaging of the left knee was performed according to the standard protocol. Multiplanar CT image reconstructions were also generated.   COMPARISON:  Radiographs 12/25/2020   FINDINGS: Bones/Joint/Cartilage   The patella that is subluxed laterally but not overtly dislocated at this time. There is substantial abnormal edema tracking along the medial retinaculum and expected region of the medial patellofemoral ligament, which may be torn or injured. Accordingly the possibility of recent transient patellar dislocation is a distinct possibility.   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   Tibial tubercle-trochlear groove distance 2.1 cm, mildly abnormally elevated.   Small knee joint effusion.   Ligaments   Suboptimally assessed by CT. 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   Muscles and Tendons   There is considerable abnormal fluid signal along the posterior sartorius muscle for example on image 55 of series 4., with indistinctness of the distal sartorius tendon which could be torn.   Soft tissues   Edema tracks superficial to the pes anserinus. There is also some low-level edema infiltrating the soft tissues posterior to the distal biceps femoris musculotendinous junction for example on image 83 series 4.   IMPRESSION: 1. The patella is subluxed laterally but not overtly dislocated at this time. Probably torn medial patellar retinaculum and medial patellofemoral ligament given the degree of surrounding edema and indistinctness. 2. Elevated tibial tubercle-trochlear groove distance at 2.1 cm. 3. 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 4. Small knee joint effusion. 5. Abnormal fluid tracks along the posterior margin of the distal sartorius muscle, and the distal sartorius tendon is indistinct and could be torn. Edema tracks superficial to the pes anserinus. 6. Low-grade infiltrative edema posterior to the distal biceps femoris musculotendinous junction, nonspecific.    PATIENT SURVEYS:  LEFS 16/80  COGNITION:  Overall cognitive status: Within functional limits for tasks assessed     SENSATION:  Light touch: Appears intact   POSTURE:  Medium body habitus, rounded shoulders, fwd head  PALPATION: Decreased quad activation L, slight TTP to medial L tibia  LE AROM/PROM:  A/PROM Right 02/09/2021 Left 02/09/2021  Knee flexion  40  Knee extension  6   (Blank rows = not tested)  LE MMT:  MMT Right 02/09/2021 Left 02/09/2021  Hip flexion [facility] DNT  Hip abduction [facility] DNT  Hip adduction WFL DNT  Knee flexion WFL DNT  Knee extension WFL DNT   (Blank rows = not tested)  LOWER EXTREMITY SPECIAL TESTS:  DNT  FUNCTIONAL TESTS:  DNT  GAIT: Distance walked: 9ft Assistive device utilized: Crutches Level of assistance: Modified independence Comments: pt ambulates in locked extension hinged brace, L LE NWB  TODAY'S TREATMENT: Therapeutic Exercise: L quad set x 10 - 5" L heel slide x 10 - 5" L  ankle pumps x 20   PATIENT EDUCATION:  Education details: eval findings, LEFS, HEP, POC Person educated: Patient Education method: Explanation, Demonstration, and Handouts Education comprehension: verbalized understanding, returned demonstration, and needs further education   HOME EXERCISE PROGRAM: Access Code: [email or web address]  ASSESSMENT:  CLINICAL IMPRESSION: Patient is a 41 y.o. F who was seen today for physical therapy evaluation and treatment for s/p s/p L ACL  reconstruction w/ tibialis anterior, MCL reconstruction, partial medial meniscectomy and manipulation under anesthesia. Physical findings are consistent with surgery and recovery timeline, as patient demonstrates objective impairments including Abnormal gait, decreased balance, decreased mobility, decreased ROM, decreased strength, and pain. These impairments are limiting patient from cleaning, community activity, occupation, and yard work. Personal factors including 1-2 comorbidities: multiple sclerosis and depression  are also affecting patient's functional outcome. Her LEFS score indicates severe disability in the functioning of home ADLS and mobility, indicating she is operating well below PLOF. Patient will benefit from skilled PT to address above impairments and improve overall function.  REHAB POTENTIAL: Good  CLINICAL DECISION MAKING: Evolving/moderate complexity  EVALUATION COMPLEXITY: Moderate   GOALS: Goals reviewed with patient? No  SHORT TERM GOALS:  STG Name Target Date Goal status  1 Pt will be compliant with initial HEP for improved comfort and function Baseline: initial HEP given 03/02/2021 INITIAL  2 Pt will self report L knee pain no greater than 3/10 for improved comfort and functional ability  Baseline: 7/10 at worst 03/02/2021 INITIAL  3 Pt will improve L knee AROM to range of 0-90 degrees for improved functional mobility and progression Baseline: 6-40 degrees 03/02/2021 INITIAL  4 Pt will be able to perform SLR with no L knee quad lag for improved strength and progression in protocol Baseline: unable 03/02/2021 INITIAL   LONG TERM GOALS:   LTG Name Target Date Goal status  1 Pt will improve LEFS to no less than 55/80 as proxy for functional improvement Baseline: 16/80 05/04/2021 INITIAL  2 Pt will self report L LE pain no greater than 1/10 for improved comfort and functional ability  Baseline: 7/10 at worst 05/04/2021 INITIAL  3 Pt will improve L knee flexion to no less  than 120 degrees for improved comfort and functional mobility Baseline: 40 degrees 05/04/2021 INITIAL  4 Pt will be able to stand in SLS on L LE for at least 30" for improved balance and stability Baseline: unable 05/04/2021 INITIAL  5 Pt will perform at least 10 reps in 30 Second Sit to Stand test for improved balance and mobility Baseline: unable to perform 05/04/2021 INITIAL   PLAN: PT FREQUENCY: 2x/week  PT DURATION: 12 weeks  PLANNED INTERVENTIONS: Therapeutic exercises, Therapeutic activity, Neuro Muscular re-education, Balance training, Gait training, Patient/Family education, Joint mobilization, Dry Needling, Electrical stimulation, Cryotherapy, Moist heat, Vasopneumatic device, and Manual therapy  PLAN FOR NEXT SESSION: assess response to HEP; TDWB gait; possible ESTIM for increase L quad contraction   Eloy End 02/09/2021, 12:37 PM

## 2021-02-13 NOTE — Therapy (Incomplete)
OUTPATIENT PHYSICAL THERAPY TREATMENT NOTE   Patient Name: Patricia Jensen MRN: [id number] DOB:1981/01/05, 41 y.o., female Today's Date: 02/13/2021  PCP: Shirlean Schlein, MD REFERRING PROVIDER: Bjorn Pippin, MD    Past Medical History:  Diagnosis Date   Anxiety    Depression    Keratoconus    MS (multiple sclerosis) (HCC)    Pregnancy induced hypertension    Past Surgical History:  Procedure Laterality Date   KNEE ARTHROSCOPY WITH ANTERIOR CRUCIATE LIGAMENT (ACL) REPAIR Left 01/26/2021   Procedure: KNEE ARTHROSCOPY WITH ANTERIOR CRUCIATE LIGAMENT (ACL) REPAIR;  Surgeon: Bjorn Pippin, MD;  Location: [facility];  Service: Orthopedics;  Laterality: Left;   KNEE ARTHROSCOPY WITH MEDIAL COLLATERAL LIGAMENT RECONSTRUCTION Left 01/26/2021   Procedure: KNEE ARTHROSCOPY WITH MEDIAL COLLATERAL LIGAMENT RECONSTRUCTION WITH MEDIAL PARTIAL MENISECTOMY;  Surgeon: Bjorn Pippin, MD;  Location: [facility];  Service: Orthopedics;  Laterality: Left;   WISDOM TOOTH EXTRACTION     Patient Active Problem List   Diagnosis Date Noted   Abnormal laboratory test 04/21/2018   Multiple sclerosis (HCC) 10/25/2017   Low back pain 12/09/2013   Pregnancy induced hypertension    Anxiety    MS (multiple sclerosis) (HCC)    Lower extremity numbness 02/11/2012   Tobacco abuse 02/11/2012   Depression 01/23/2011   SVD (spontaneous vaginal delivery) 01/22/2011    REFERRING DIAG: Left allograft ACL reconstruction with tibialis anterior, MCL reconstruction Manipulation  ander anesthesia, Partial medial meniscectomy1/05/2021  THERAPY DIAG:  No diagnosis found.  PERTINENT HISTORY: Pt is s/p L ACL reconstruction w/ tibialis anterior, MCL reconstruction, partial medial meniscectomy and manipulation under anesthesia  PRECAUTIONS: Knee; 0-6wks TDWB; Left Knee 0-90deg with hinged knee brace locked in extension  WEIGHT BEARING RESTRICTIONS Yes TDWB  SUBJECTIVE: ***  PAIN:   Are you having pain? {yes/no:20286} NPRS scale: ***/10 Pain location: *** Pain orientation: {Pain Orientation:25161}  PAIN TYPE: {type:313116} Pain description: {PAIN DESCRIPTION:21022940}  Aggravating factors: *** Relieving factors: ***     PATIENT SURVEYS:  LEFS 16/80   COGNITION:          Overall cognitive status: Within functional limits for tasks assessed                        SENSATION:          Light touch: Appears intact           POSTURE:  Medium body habitus, rounded shoulders, fwd head   PALPATION: Decreased quad activation L, slight TTP to medial L tibia   LE AROM/PROM:   A/PROM Right 02/09/2021 Left 02/09/2021  Knee flexion   40  Knee extension   6   (Blank rows = not tested)   LE MMT:   MMT Right 02/09/2021 Left 02/09/2021  Hip flexion [facility] DNT  Hip abduction [facility] DNT  Hip adduction WFL DNT  Knee flexion WFL DNT  Knee extension WFL DNT   (Blank rows = not tested)   LOWER EXTREMITY SPECIAL TESTS:  DNT   FUNCTIONAL TESTS:  DNT   GAIT: Distance walked: 30ft Assistive device utilized: Crutches Level of assistance: Modified independence Comments: pt ambulates in locked extension hinged brace, L LE NWB   TODAY'S TREATMENT: Therapeutic Exercise: L quad set x 10 - 5" L heel slide x 10 - 5" L ankle pumps x 20     PATIENT EDUCATION:  Education details: eval findings, LEFS, HEP, POC Person educated: Patient Education method: Explanation,  Demonstration, and Handouts Education comprehension: verbalized understanding, returned demonstration, and needs further education     HOME EXERCISE PROGRAM: Access Code: [email or web address]   ASSESSMENT:   CLINICAL IMPRESSION: Patient is a 41 y.o. F who was seen today for physical therapy evaluation and treatment for s/p s/p L ACL reconstruction w/ tibialis anterior, MCL reconstruction, partial medial meniscectomy and manipulation under anesthesia. Physical findings are consistent with surgery and recovery  timeline, as patient demonstrates objective impairments including Abnormal gait, decreased balance, decreased mobility, decreased ROM, decreased strength, and pain. These impairments are limiting patient from cleaning, community activity, occupation, and yard work. Personal factors including 1-2 comorbidities: multiple sclerosis and depression  are also affecting patient's functional outcome. Her LEFS score indicates severe disability in the functioning of home ADLS and mobility, indicating she is operating well below PLOF. Patient will benefit from skilled PT to address above impairments and improve overall function.   REHAB POTENTIAL: Good   CLINICAL DECISION MAKING: Evolving/moderate complexity   EVALUATION COMPLEXITY: Moderate     GOALS: Goals reviewed with patient? No   SHORT TERM GOALS:   STG Name Target Date Goal status  1 Pt will be compliant with initial HEP for improved comfort and function Baseline: initial HEP given 03/02/2021 INITIAL  2 Pt will self report L knee pain no greater than 3/10 for improved comfort and functional ability  Baseline: 7/10 at worst 03/02/2021 INITIAL  3 Pt will improve L knee AROM to range of 0-90 degrees for improved functional mobility and progression Baseline: 6-40 degrees 03/02/2021 INITIAL  4 Pt will be able to perform SLR with no L knee quad lag for improved strength and progression in protocol Baseline: unable 03/02/2021 INITIAL    LONG TERM GOALS:    LTG Name Target Date Goal status  1 Pt will improve LEFS to no less than 55/80 as proxy for functional improvement Baseline: 16/80 05/04/2021 INITIAL  2 Pt will self report L LE pain no greater than 1/10 for improved comfort and functional ability  Baseline: 7/10 at worst 05/04/2021 INITIAL  3 Pt will improve L knee flexion to no less than 120 degrees for improved comfort and functional mobility Baseline: 40 degrees 05/04/2021 INITIAL  4 Pt will be able to stand in SLS on L LE for at least 30" for  improved balance and stability Baseline: unable 05/04/2021 INITIAL  5 Pt will perform at least 10 reps in 30 Second Sit to Stand test for improved balance and mobility Baseline: unable to perform 05/04/2021 INITIAL    PLAN: PT FREQUENCY: 2x/week   PT DURATION: 12 weeks   PLANNED INTERVENTIONS: Therapeutic exercises, Therapeutic activity, Neuro Muscular re-education, Balance training, Gait training, Patient/Family education, Joint mobilization, Dry Needling, Electrical stimulation, Cryotherapy, Moist heat, Vasopneumatic device, and Manual therapy   PLAN FOR NEXT SESSION: assess response to HEP; TDWB gait; possible ESTIM for increase L quad contraction    Jenelle Mages, PT 02/13/2021, 8:47 PM

## 2021-02-14 ENCOUNTER — Ambulatory Visit: Payer: 59 | Admitting: Physical Therapy

## 2021-02-15 NOTE — Therapy (Signed)
OUTPATIENT PHYSICAL THERAPY TREATMENT NOTE   Patient Name: Patricia Jensen MRN: [id number] DOB:Mar 06, 1980, 41 y.o., female Today's Date: 02/16/2021  PCP: Shirlean Schlein, MD REFERRING PROVIDER: Bjorn Pippin, MD   PT End of Session - 02/16/21 1007     Visit Number 2    Number of Visits 25    Date for PT Re-Evaluation 05/04/21    Authorization Type [employer]    PT Start Time 1010    PT Stop Time 1108    PT Time Calculation (min) 58 min    Activity Tolerance Patient tolerated treatment well    Behavior During Therapy WFL for tasks assessed/performed             Past Medical History:  Diagnosis Date   Anxiety    Depression    Keratoconus    MS (multiple sclerosis) (HCC)    Pregnancy induced hypertension    Past Surgical History:  Procedure Laterality Date   KNEE ARTHROSCOPY WITH ANTERIOR CRUCIATE LIGAMENT (ACL) REPAIR Left 01/26/2021   Procedure: KNEE ARTHROSCOPY WITH ANTERIOR CRUCIATE LIGAMENT (ACL) REPAIR;  Surgeon: Bjorn Pippin, MD;  Location: [facility];  Service: Orthopedics;  Laterality: Left;   KNEE ARTHROSCOPY WITH MEDIAL COLLATERAL LIGAMENT RECONSTRUCTION Left 01/26/2021   Procedure: KNEE ARTHROSCOPY WITH MEDIAL COLLATERAL LIGAMENT RECONSTRUCTION WITH MEDIAL PARTIAL MENISECTOMY;  Surgeon: Bjorn Pippin, MD;  Location: [facility];  Service: Orthopedics;  Laterality: Left;   WISDOM TOOTH EXTRACTION     Patient Active Problem List   Diagnosis Date Noted   Abnormal laboratory test 04/21/2018   Multiple sclerosis (HCC) 10/25/2017   Low back pain 12/09/2013   Pregnancy induced hypertension    Anxiety    MS (multiple sclerosis) (HCC)    Lower extremity numbness 02/11/2012   Tobacco abuse 02/11/2012   Depression 01/23/2011   SVD (spontaneous vaginal delivery) 01/22/2011    REFERRING DIAG: Left allograft ACL reconstruction with tibialis anterior , MCL reconstruction Manipulation  ander anesthesia ,Partial medial  meniscectomy1/05/2021  THERAPY DIAG:  Acute pain of left knee  Muscle weakness (generalized)  Other abnormalities of gait and mobility  PERTINENT HISTORY: Pt is s/p L ACL reconstruction w/ tibialis anterior, MCL reconstruction, partial medial meniscectomy and manipulation under anesthesia  PRECAUTIONS: Surgery 01-26-21 Knee; 0-6wks TDWB; Left Knee 0-90deg with hinged knee brace locked in extension  SUBJECTIVE: SUBJECTIVE STATEMENT: Pt enters clinic tentatively using crutches and non wt bearing.  Pt reports 3/10 at rest and with stiffness increased since last visit. I dont know if I iced my leg too much. I am afraid I am regressing. And I feel like my knee is really stiff. I like using my ACE wrap. It is my security blanket Pt has PMH of multiple sclerosis, has not had an exacerbation in approximately one year, only has residual distal L foot numbness. Initial twisting injury on 12/24/2021 with resultant indication for surgery 12-26-21     PAIN:  Are you having pain? Yes 3 weeks post op 02-16-21) NPRS scale: 3/10 (7/10 at worst) Pain location: L Knee PAIN TYPE: aching Pain description: intermittent  Aggravating factors: transfers Relieving factors: ice, tylenol      OBJECTIVE: All objective information taken at eval unless otherwise noted   DIAGNOSTIC FINDINGS:  CLINICAL DATA:  Twisting injury left knee after fall.   EXAM: CT OF THE left KNEE WITHOUT CONTRAST   TECHNIQUE: Multidetector CT imaging of the left knee was performed according to the standard protocol. Multiplanar CT  image reconstructions were also generated.   COMPARISON:  Radiographs 12/25/2020   FINDINGS: Bones/Joint/Cartilage   The patella that is subluxed laterally but not overtly dislocated at this time. There is substantial abnormal edema tracking along the medial retinaculum and expected region of the medial patellofemoral ligament, which may be torn or injured. Accordingly the possibility of recent  transient patellar dislocation is a distinct possibility.   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   Tibial tubercle-trochlear groove distance 2.1 cm, mildly abnormally elevated.   Small knee joint effusion.   Ligaments   Suboptimally assessed by CT. 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   Muscles and Tendons   There is considerable abnormal fluid signal along the posterior sartorius muscle for example on image 55 of series 4., with indistinctness of the distal sartorius tendon which could be torn.   Soft tissues   Edema tracks superficial to the pes anserinus. There is also some low-level edema infiltrating the soft tissues posterior to the distal biceps femoris musculotendinous junction for example on image 83 series 4.   IMPRESSION: 1. The patella is subluxed laterally but not overtly dislocated at this time. Probably torn medial patellar retinaculum and medial patellofemoral ligament given the degree of surrounding edema and indistinctness. 2. Elevated tibial tubercle-trochlear groove distance at 2.1 cm. 3. 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 4. Small knee joint effusion. 5. Abnormal fluid tracks along the posterior margin of the distal sartorius muscle, and the distal sartorius tendon is indistinct and could be torn. Edema tracks superficial to the pes anserinus. 6. Low-grade infiltrative edema posterior to the distal biceps femoris musculotendinous junction, nonspecific.      PATIENT SURVEYS:  LEFS 16/80   COGNITION:          Overall cognitive status: Within functional limits  for tasks assessed                        SENSATION:          Light touch: Appears intact           POSTURE:  Medium body habitus, rounded shoulders, fwd head   PALPATION: Decreased quad activation L, slight TTP to medial L tibia   LE AROM/PROM:   A/PROM Right 02/09/2021 Left 02/09/2021 Left  02-16-21  Knee flexion   40 45  Knee extension   6 6   (Blank rows = not tested)   LE MMT:   MMT Right 02/09/2021 Left 02/09/2021  Hip flexion [facility] DNT  Hip abduction [facility] DNT  Hip adduction [facility] DNT  Knee flexion [facility] DNT  Knee extension WFL DNT   (Blank rows = not tested)   LOWER EXTREMITY SPECIAL TESTS:  DNT   FUNCTIONAL TESTS:  DNT   GAIT: Distance walked: 211ft  Assistive device utilized: Crutches Level of assistance: Modified independence Comments: pt ambulates in locked extension hinged brace, L LE TDWB Worked on proper TTWB and step through and proper use of crutches   TODAY'S TREATMENT:   [facility] Adult PT Treatment:  DATE: 02-16-21 Therapeutic Exercise: L quad set x 20  - 5" with towel roll at ankle L heel slide  3 x 10 - 5" L ankle pumps x 20 In locked extension brace SLR 3 x 10 GAIT-  Educated pt with proper use of crutches and TDWB L  Pt entered clinic NWB L and tentative about any wt bearing in L with step to gait.  Pt practiced for 250 ft and gained more confident, step through gait pattern Manual Therapy: Educated pt in patellar mobs to perform every day for 5 to 10 min.  Pt returned demo after instruction.  Concentrated on Inf and sup patellar mobs Retrograde massage for Left knee for edema.  Pt with increased tissue tension around patellar tuberosity  in a band. Pt may have been using compressive ace bandage too tightly Modalites  [place] Stim to Left quad with instructon for quad set for ramp ups for 15 minutes Pulse duration , freg 50 Hz, ramp up 2 sec ontime 15 sec off time 50 sec  [facility] Adult PT eval                                                 DATE: 02-09-21 Therapeutic Exercise L quad set x 10 - 5" L heel slide x 10 - 5" L ankle pumps x 20    Access Code: [email or web address] URL: [email or web address] Date: 02/16/2021 Prepared by: Garen Lah  Exercises Long Sitting Quad Set - 8 x daily - 7 x weekly - 2-3 sets - 10 reps - 5 sec hold Supine Ankle Pumps - 8 x daily - 7 x weekly - 20-40 reps Supine Heel Slide - 8 x daily - 7 x weekly - 2-3 sets - 10 reps - 5 sec hold Active Straight Leg Raise with Quad Set - 1 x daily - 7 x weekly - 3 sets - 10 reps Long Sitting Inferior Patellar Glide - 1 x daily - 7 x weekly - 1 sets - patellar self mobs 5-10 min daily Long Sitting Superior Patellar Glide - 1 x daily - 7 x weekly - 1 sets - 5-10 min patellar mob  PATIENT EDUCATION:  Education details: updated Person educated: Patient Education method: Programmer, multimedia, Demonstration, and Handouts Education comprehension: verbalized understanding, returned demonstration, and needs further education     HOME EXERCISE PROGRAM: Access Code: [email or web address]   ASSESSMENT:   CLINICAL IMPRESSION: Chanda Laperle  enters clinic with crutches and NWB L but was progressed to TDWB L.  Pt updated HEP today to include initial HEP and added SLR with locked extension brace.  Pt was also educated in patellar mobilization and given instructions to perform daily for 5-10 min.  Pt needed extra time to perform patellar mobs correctly.  Pt is fearful of regressing and was wearing ACE wrap too tightly and around knee. Pt AROM L knee flexion 45 and ext 6.     REHAB POTENTIAL: Good   CLINICAL DECISION MAKING: Evolving/moderate complexity   EVALUATION COMPLEXITY: Moderate     GOALS: Goals reviewed with patient? No   SHORT TERM GOALS:   STG Name Target Date Goal status  1 Pt will be compliant with initial HEP for improved comfort and function Baseline: initial HEP given 03/02/2021 INITIAL  2 Pt will self report L knee pain  no greater than 3/10 for improved comfort and functional  ability  Baseline: 7/10 at worst 03/02/2021 INITIAL  3 Pt will improve L knee AROM to range of 0-90 degrees for improved functional mobility and progression Baseline: 6-40 degrees 03/02/2021 INITIAL  4 Pt will be able to perform SLR with no L knee quad lag for improved strength and progression in protocol Baseline: unable 03/02/2021 INITIAL    LONG TERM GOALS:    LTG Name Target Date Goal status  1 Pt will improve LEFS to no less than 55/80 as proxy for functional improvement Baseline: 16/80 05/04/2021 INITIAL  2 Pt will self report L LE pain no greater than 1/10 for improved comfort and functional ability  Baseline: 7/10 at worst 05/04/2021 INITIAL  3 Pt will improve L knee flexion to no less than 120 degrees for improved comfort and functional mobility Baseline: 40 degrees 05/04/2021 INITIAL  4 Pt will be able to stand in SLS on L LE for at least 30" for improved balance and stability Baseline: unable 05/04/2021 INITIAL  5 Pt will perform at least 10 reps in 30 Second Sit to Stand test for improved balance and mobility Baseline: unable to perform 05/04/2021 INITIAL    PLAN: PT FREQUENCY: 2x/week   PT DURATION: 12 weeks   PLANNED INTERVENTIONS: Therapeutic exercises, Therapeutic activity, Neuro Muscular re-education, Balance training, Gait training, Patient/Family education, Joint mobilization, Dry Needling, Electrical stimulation, Cryotherapy, Moist heat, Vasopneumatic device, and Manual therapy   PLAN FOR NEXT SESSION: assess response to HEP and self patell possible ESTIM for increase L quad contraction , show pt how to unlock brace to 90 for exercise heel slides to 90       Garen Lah, PT, [facility] Certified Exercise Expert for the Aging Adult  02/16/21 1:19 PM Phone: [phone] Fax: [phone]

## 2021-02-16 ENCOUNTER — Ambulatory Visit: Payer: 59 | Admitting: Physical Therapy

## 2021-02-16 ENCOUNTER — Encounter: Payer: Self-pay | Admitting: Physical Therapy

## 2021-02-16 ENCOUNTER — Other Ambulatory Visit: Payer: Self-pay

## 2021-02-16 DIAGNOSIS — M25562 Pain in left knee: Secondary | ICD-10-CM

## 2021-02-16 DIAGNOSIS — M6281 Muscle weakness (generalized): Secondary | ICD-10-CM

## 2021-02-16 DIAGNOSIS — R2689 Other abnormalities of gait and mobility: Secondary | ICD-10-CM

## 2021-02-21 ENCOUNTER — Ambulatory Visit: Payer: 59

## 2021-02-22 ENCOUNTER — Other Ambulatory Visit: Payer: Self-pay

## 2021-02-22 ENCOUNTER — Ambulatory Visit: Payer: 59 | Attending: Orthopaedic Surgery

## 2021-02-22 DIAGNOSIS — Z419 Encounter for procedure for purposes other than remedying health state, unspecified: Secondary | ICD-10-CM | POA: Diagnosis not present

## 2021-02-22 DIAGNOSIS — M6281 Muscle weakness (generalized): Secondary | ICD-10-CM | POA: Diagnosis present

## 2021-02-22 DIAGNOSIS — M25562 Pain in left knee: Secondary | ICD-10-CM | POA: Diagnosis not present

## 2021-02-22 DIAGNOSIS — R2689 Other abnormalities of gait and mobility: Secondary | ICD-10-CM | POA: Diagnosis present

## 2021-02-22 NOTE — Therapy (Incomplete)
OUTPATIENT PHYSICAL THERAPY TREATMENT NOTE   Patient Name: Patricia Jensen MRN: [id number] DOB:May 21, 1980, 41 y.o., female Today's Date: 02/22/2021  PCP: Shirlean Schlein, MD REFERRING PROVIDER: Bjorn Pippin, MD     Past Medical History:  Diagnosis Date   Anxiety    Depression    Keratoconus    MS (multiple sclerosis) (HCC)    Pregnancy induced hypertension    Past Surgical History:  Procedure Laterality Date   KNEE ARTHROSCOPY WITH ANTERIOR CRUCIATE LIGAMENT (ACL) REPAIR Left 01/26/2021   Procedure: KNEE ARTHROSCOPY WITH ANTERIOR CRUCIATE LIGAMENT (ACL) REPAIR;  Surgeon: Bjorn Pippin, MD;  Location: [facility];  Service: Orthopedics;  Laterality: Left;   KNEE ARTHROSCOPY WITH MEDIAL COLLATERAL LIGAMENT RECONSTRUCTION Left 01/26/2021   Procedure: KNEE ARTHROSCOPY WITH MEDIAL COLLATERAL LIGAMENT RECONSTRUCTION WITH MEDIAL PARTIAL MENISECTOMY;  Surgeon: Bjorn Pippin, MD;  Location: [facility];  Service: Orthopedics;  Laterality: Left;   WISDOM TOOTH EXTRACTION     Patient Active Problem List   Diagnosis Date Noted   Abnormal laboratory test 04/21/2018   Multiple sclerosis (HCC) 10/25/2017   Low back pain 12/09/2013   Pregnancy induced hypertension    Anxiety    MS (multiple sclerosis) (HCC)    Lower extremity numbness 02/11/2012   Tobacco abuse 02/11/2012   Depression 01/23/2011   SVD (spontaneous vaginal delivery) 01/22/2011    REFERRING DIAG: Left allograft ACL reconstruction with tibialis anterior , MCL reconstruction Manipulation  ander anesthesia ,Partial medial meniscectomy1/05/2021  THERAPY DIAG:  No diagnosis found.  PERTINENT HISTORY: Pt is s/p L ACL reconstruction w/ tibialis anterior, MCL reconstruction, partial medial meniscectomy and manipulation under anesthesia  PRECAUTIONS: Surgery 01-26-21 Knee; 0-6wks TDWB; Left Knee 0-90deg with hinged knee brace locked in extension  SUBJECTIVE:  ***    PAIN:  Are you having  pain? No  NPRS scale: 0/10 (7/10 at worst) Pain location: L Knee PAIN TYPE: aching Pain description: intermittent  Aggravating factors: transfers Relieving factors: ice, tylenol      OBJECTIVE: All objective information taken at eval unless otherwise noted   PATIENT SURVEYS:  LEFS 16/80   LE AROM/PROM:   A/PROM Left 02/09/2021 Left  02/16/2021 Left  02/22/2021  Knee flexion 40 45 47  Knee extension 6 6 1    (Blank rows = not tested)   LE MMT:   MMT Right 02/09/2021 Left 02/09/2021  Hip flexion [facility] DNT  Hip abduction [facility] DNT  Hip adduction [facility] DNT  Knee flexion [facility] DNT  Knee extension WFL DNT   (Blank rows = not tested)   TODAY'S TREATMENT:   [facility] Adult PT Treatment:                                                DATE: 02/22/2021 Therapeutic Exercise: *** Manual Therapy: *** Neuromuscular re-ed: *** Therapeutic Activity: *** Modalities: *** Self Care: ***   [facility] Adult PT Treatment:                                                DATE: 02-22-21 Therapeutic Exercise: L quad set x 20  - 5" with towel roll at ankle L heel slide  3 x 10 - 5" Long sitting  heel slide with strap 4x10 - 5" L ankle pumps x 20 In locked extension brace SLR 3 x 10 Modalites  Russian Stim to Left quad with instructon for quad set for ramp ups for 15 minutes Pulse duration , freg 50 Hz, ramp up 2 sec ontime 15 sec off time 50 sec    [facility] Adult PT Treatment:                                                DATE: 02-16-21 Therapeutic Exercise: L quad set x 20  - 5" with towel roll at ankle Supine L heel slide  3 x 10 - 5" L ankle pumps x 20 In locked extension brace SLR 3 x 10 GAIT-  Educated pt with proper use of crutches and TDWB L  Pt entered clinic NWB L and tentative about any wt bearing in L with step to gait.  Pt practiced for 250 ft and gained more confident, step through gait pattern Manual Therapy: Educated pt in patellar mobs to perform every day for 5 to 10 min.  Pt  returned demo after instruction.  Concentrated on Inf and sup patellar mobs Retrograde massage for Left knee for edema.  Pt with increased tissue tension around patellar tuberosity  in a band. Pt may have been using compressive ace bandage too tightly Modalites  [place] Stim to Left quad with instructon for quad set for ramp ups for 15 minutes Pulse duration , freg 27 Hz, ramp up 2 sec ontime 10 on and 10 off  [facility] Adult PT eval                                                DATE: 02-09-21 Therapeutic Exercise L quad set x 10 - 5" L heel slide x 10 - 5" L ankle pumps x 20   PATIENT EDUCATION:  Education details: updated Person educated: Patient Education method: Explanation, Demonstration, and Handouts Education comprehension: verbalized understanding, returned demonstration, and needs further education     HOME EXERCISE PROGRAM: Access Code: [email or web address] URL: [email or web address] Date: 02/16/2021 Prepared by: Garen Lah  Exercises Long Sitting Quad Set - 8 x daily - 7 x weekly - 2-3 sets - 10 reps - 5 sec hold Supine Ankle Pumps - 8 x daily - 7 x weekly - 20-40 reps Supine Heel Slide - 8 x daily - 7 x weekly - 2-3 sets - 10 reps - 5 sec hold Active Straight Leg Raise with Quad Set - 1 x daily - 7 x weekly - 3 sets - 10 reps Long Sitting Inferior Patellar Glide - 1 x daily - 7 x weekly - 1 sets - patellar self mobs 5-10 min daily Long Sitting Superior Patellar Glide - 1 x daily - 7 x weekly - 1 sets - 5-10 min patellar mob Supine Heel Slide with Strap - 8 x daily - 7 x weekly - 3 sets - 10 reps - 5 sec hold   ASSESSMENT:   CLINICAL IMPRESSION: ***   REHAB POTENTIAL: Good   CLINICAL DECISION MAKING: Evolving/moderate complexity   EVALUATION COMPLEXITY: Moderate     GOALS: Goals reviewed with patient? No   SHORT TERM GOALS:   STG Name Target  Date Goal status  1 Pt will be compliant with initial HEP for improved comfort and function Baseline:  initial HEP given 03/02/2021 INITIAL  2 Pt will self report L knee pain no greater than 3/10 for improved comfort and functional ability  Baseline: 7/10 at worst 03/02/2021 INITIAL  3 Pt will improve L knee AROM to range of 0-90 degrees for improved functional mobility and progression Baseline: 6-40 degrees 03/02/2021 INITIAL  4 Pt will be able to perform SLR with no L knee quad lag for improved strength and progression in protocol Baseline: unable 03/02/2021 INITIAL    LONG TERM GOALS:    LTG Name Target Date Goal status  1 Pt will improve LEFS to no less than 55/80 as proxy for functional improvement Baseline: 16/80 05/04/2021 INITIAL  2 Pt will self report L LE pain no greater than 1/10 for improved comfort and functional ability  Baseline: 7/10 at worst 05/04/2021 INITIAL  3 Pt will improve L knee flexion to no less than 120 degrees for improved comfort and functional mobility Baseline: 40 degrees 05/04/2021 INITIAL  4 Pt will be able to stand in SLS on L LE for at least 30" for improved balance and stability Baseline: unable 05/04/2021 INITIAL  5 Pt will perform at least 10 reps in 30 Second Sit to Stand test for improved balance and mobility Baseline: unable to perform 05/04/2021 INITIAL    PLAN: PT FREQUENCY: 2x/week   PT DURATION: 12 weeks   PLANNED INTERVENTIONS: Therapeutic exercises, Therapeutic activity, Neuro Muscular re-education, Balance training, Gait training, Patient/Family education, Joint mobilization, Dry Needling, Electrical stimulation, Cryotherapy, Moist heat, Vasopneumatic device, and Manual therapy   PLAN FOR NEXT SESSION: assess response to HEP and self patell possible ESTIM for increase L quad contraction , show pt how to unlock brace to 90 for exercise heel slides to 90   Carmelina Dane, PT, DPT 02/22/21 3:22 PM

## 2021-02-22 NOTE — Therapy (Signed)
OUTPATIENT PHYSICAL THERAPY TREATMENT NOTE   Patient Name: Patricia Jensen MRN: [id number] DOB:09/07/1980, 41 y.o., female Today's Date: 02/22/2021  PCP: Shirlean Schlein, MD REFERRING PROVIDER: Olena Heckle *   PT End of Session - 02/22/21 1030     Visit Number 3    Number of Visits 25    Date for PT Re-Evaluation 05/04/21    Authorization Type [employer]    PT Start Time 1045    PT Stop Time 1124   estim 15 min post session   PT Time Calculation (min) 39 min    Activity Tolerance Patient tolerated treatment well    Behavior During Therapy WFL for tasks assessed/performed             Past Medical History:  Diagnosis Date   Anxiety    Depression    Keratoconus    MS (multiple sclerosis) (HCC)    Pregnancy induced hypertension    Past Surgical History:  Procedure Laterality Date   KNEE ARTHROSCOPY WITH ANTERIOR CRUCIATE LIGAMENT (ACL) REPAIR Left 01/26/2021   Procedure: KNEE ARTHROSCOPY WITH ANTERIOR CRUCIATE LIGAMENT (ACL) REPAIR;  Surgeon: Bjorn Pippin, MD;  Location: [facility];  Service: Orthopedics;  Laterality: Left;   KNEE ARTHROSCOPY WITH MEDIAL COLLATERAL LIGAMENT RECONSTRUCTION Left 01/26/2021   Procedure: KNEE ARTHROSCOPY WITH MEDIAL COLLATERAL LIGAMENT RECONSTRUCTION WITH MEDIAL PARTIAL MENISECTOMY;  Surgeon: Bjorn Pippin, MD;  Location: [facility];  Service: Orthopedics;  Laterality: Left;   WISDOM TOOTH EXTRACTION     Patient Active Problem List   Diagnosis Date Noted   Abnormal laboratory test 04/21/2018   Multiple sclerosis (HCC) 10/25/2017   Low back pain 12/09/2013   Pregnancy induced hypertension    Anxiety    MS (multiple sclerosis) (HCC)    Lower extremity numbness 02/11/2012   Tobacco abuse 02/11/2012   Depression 01/23/2011   SVD (spontaneous vaginal delivery) 01/22/2011    REFERRING DIAG: Left allograft ACL reconstruction with tibialis anterior , MCL reconstruction Manipulation  ander  anesthesia ,Partial medial meniscectomy1/05/2021  THERAPY DIAG:  Acute pain of left knee  Muscle weakness (generalized)  Other abnormalities of gait and mobility  PERTINENT HISTORY: Pt is s/p L ACL reconstruction w/ tibialis anterior, MCL reconstruction, partial medial meniscectomy and manipulation under anesthesia  PRECAUTIONS: Surgery 01-26-21 Knee; 0-6wks TDWB; Left Knee 0-90deg with hinged knee brace locked in extension  SUBJECTIVE:  Pt presents to PT with reports of decreased pain. Has been compliant with HEP with no adverse effect. She is ready to begin PT at this time.     PAIN:  Are you having pain? No  NPRS scale: 0/10 (7/10 at worst) Pain location: L Knee PAIN TYPE: aching Pain description: intermittent  Aggravating factors: transfers Relieving factors: ice, tylenol      OBJECTIVE: All objective information taken at eval unless otherwise noted   PATIENT SURVEYS:  LEFS 16/80   LE AROM/PROM:   A/PROM Left 02/09/2021 Left  02/16/2021 Left  02/22/2021  Knee flexion 40 45 47  Knee extension 6 6 1    (Blank rows = not tested)   LE MMT:   MMT Right 02/09/2021 Left 02/09/2021  Hip flexion [facility] DNT  Hip abduction [facility] DNT  Hip adduction [facility] DNT  Knee flexion [facility] DNT  Knee extension WFL DNT   (Blank rows = not tested)   TODAY'S TREATMENT:  [facility] Adult PT Treatment:  DATE: 02-22-21 Therapeutic Exercise: L quad set x 20  - 5" with towel roll at ankle L heel slide  3 x 10 - 5" Long sitting heel slide with strap 4x10 - 5" L ankle pumps x 20 In locked extension brace SLR 3 x 10 Modalites  Russian Stim to Left quad with instructon for quad set for ramp ups for 15 minutes Pulse duration , freg 50 Hz, ramp up 2 sec ontime 15 sec off time 50 sec    [facility] Adult PT Treatment:                                                DATE: 02-16-21 Therapeutic Exercise: L quad set x 20  - 5" with towel roll at ankle Supine L heel  slide  3 x 10 - 5" L ankle pumps x 20 In locked extension brace SLR 3 x 10 GAIT-  Educated pt with proper use of crutches and TDWB L  Pt entered clinic NWB L and tentative about any wt bearing in L with step to gait.  Pt practiced for 250 ft and gained more confident, step through gait pattern Manual Therapy: Educated pt in patellar mobs to perform every day for 5 to 10 min.  Pt returned demo after instruction.  Concentrated on Inf and sup patellar mobs Retrograde massage for Left knee for edema.  Pt with increased tissue tension around patellar tuberosity  in a band. Pt may have been using compressive ace bandage too tightly Modalites  [place] Stim to Left quad with instructon for quad set for ramp ups for 15 minutes Pulse duration , freg 27 Hz, ramp up 2 sec ontime 10 on and 10 off  [facility] Adult PT eval                                                DATE: 02-09-21 Therapeutic Exercise L quad set x 10 - 5" L heel slide x 10 - 5" L ankle pumps x 20   PATIENT EDUCATION:  Education details: updated Person educated: Patient Education method: Explanation, Demonstration, and Handouts Education comprehension: verbalized understanding, returned demonstration, and needs further education     HOME EXERCISE PROGRAM: Access Code: [email or web address] URL: [email or web address] Date: 02/16/2021 Prepared by: Garen Lah  Exercises Long Sitting Quad Set - 8 x daily - 7 x weekly - 2-3 sets - 10 reps - 5 sec hold Supine Ankle Pumps - 8 x daily - 7 x weekly - 20-40 reps Supine Heel Slide - 8 x daily - 7 x weekly - 2-3 sets - 10 reps - 5 sec hold Active Straight Leg Raise with Quad Set - 1 x daily - 7 x weekly - 3 sets - 10 reps Long Sitting Inferior Patellar Glide - 1 x daily - 7 x weekly - 1 sets - patellar self mobs 5-10 min daily Long Sitting Superior Patellar Glide - 1 x daily - 7 x weekly - 1 sets - 5-10 min patellar mob Supine Heel Slide with Strap - 8 x daily - 7 x weekly - 3 sets  - 10 reps - 5 sec hold   ASSESSMENT:   CLINICAL IMPRESSION: Pt was able to complete all prescribed exercises with  no adverse effect or increase in pain. Therapy today continued to focus on progression of L knee ROM post surgery, with pt continuing to show improvement with L knee extension in particular. She continues to lack L knee flexion, but has made slight improvements since evaluation. HEP updated for L knee flexion strength in long sitting with a strap for increasing mobility. She continues to benefit from skilled PT services, with PT continuing to progress per protocol as tolerated.    REHAB POTENTIAL: Good   CLINICAL DECISION MAKING: Evolving/moderate complexity   EVALUATION COMPLEXITY: Moderate     GOALS: Goals reviewed with patient? No   SHORT TERM GOALS:   STG Name Target Date Goal status  1 Pt will be compliant with initial HEP for improved comfort and function Baseline: initial HEP given 03/02/2021 INITIAL  2 Pt will self report L knee pain no greater than 3/10 for improved comfort and functional ability  Baseline: 7/10 at worst 03/02/2021 INITIAL  3 Pt will improve L knee AROM to range of 0-90 degrees for improved functional mobility and progression Baseline: 6-40 degrees 03/02/2021 INITIAL  4 Pt will be able to perform SLR with no L knee quad lag for improved strength and progression in protocol Baseline: unable 03/02/2021 INITIAL    LONG TERM GOALS:    LTG Name Target Date Goal status  1 Pt will improve LEFS to no less than 55/80 as proxy for functional improvement Baseline: 16/80 05/04/2021 INITIAL  2 Pt will self report L LE pain no greater than 1/10 for improved comfort and functional ability  Baseline: 7/10 at worst 05/04/2021 INITIAL  3 Pt will improve L knee flexion to no less than 120 degrees for improved comfort and functional mobility Baseline: 40 degrees 05/04/2021 INITIAL  4 Pt will be able to stand in SLS on L LE for at least 30" for improved balance and  stability Baseline: unable 05/04/2021 INITIAL  5 Pt will perform at least 10 reps in 30 Second Sit to Stand test for improved balance and mobility Baseline: unable to perform 05/04/2021 INITIAL    PLAN: PT FREQUENCY: 2x/week   PT DURATION: 12 weeks   PLANNED INTERVENTIONS: Therapeutic exercises, Therapeutic activity, Neuro Muscular re-education, Balance training, Gait training, Patient/Family education, Joint mobilization, Dry Needling, Electrical stimulation, Cryotherapy, Moist heat, Vasopneumatic device, and Manual therapy   PLAN FOR NEXT SESSION: assess response to HEP and self patell possible ESTIM for increase L quad contraction , show pt how to unlock brace to 90 for exercise heel slides to 90   Eloy End, PT 02/22/21 11:51 AM

## 2021-02-23 ENCOUNTER — Ambulatory Visit: Payer: 59

## 2021-02-28 ENCOUNTER — Other Ambulatory Visit: Payer: Self-pay

## 2021-02-28 ENCOUNTER — Ambulatory Visit: Payer: 59

## 2021-02-28 DIAGNOSIS — R2689 Other abnormalities of gait and mobility: Secondary | ICD-10-CM

## 2021-02-28 DIAGNOSIS — M25562 Pain in left knee: Secondary | ICD-10-CM

## 2021-02-28 DIAGNOSIS — M6281 Muscle weakness (generalized): Secondary | ICD-10-CM

## 2021-02-28 NOTE — Therapy (Signed)
OUTPATIENT PHYSICAL THERAPY TREATMENT NOTE   Patient Name: Patricia Jensen MRN: [id number] DOB:[birth year], 41 y.o., female Today's Date: 02/28/2021  PCP: Concepcion Living, MD REFERRING PROVIDER: Reinaldo Berber *   PT End of Session - 02/28/21 0911     Visit Number 4    Number of Visits 25    Date for PT Re-Evaluation 05/04/21    Authorization Type [employer]    PT Start Time 0915    PT Stop Time 1000   additional 12 minutes of unattendend ESTIM post session   PT Time Calculation (min) 45 min    Activity Tolerance Patient tolerated treatment well    Behavior During Therapy WFL for tasks assessed/performed             Past Medical History:  Diagnosis Date   Anxiety    Depression    Keratoconus    MS (multiple sclerosis) ([place])    Pregnancy induced hypertension    Past Surgical History:  Procedure Laterality Date   KNEE ARTHROSCOPY WITH ANTERIOR CRUCIATE LIGAMENT (ACL) REPAIR Left 01/26/2021   Procedure: KNEE ARTHROSCOPY WITH ANTERIOR CRUCIATE LIGAMENT (ACL) REPAIR;  Surgeon: Hiram Gash, MD;  Location: [facility];  Service: Orthopedics;  Laterality: Left;   KNEE ARTHROSCOPY WITH MEDIAL COLLATERAL LIGAMENT RECONSTRUCTION Left 01/26/2021   Procedure: KNEE ARTHROSCOPY WITH MEDIAL COLLATERAL LIGAMENT RECONSTRUCTION WITH MEDIAL PARTIAL MENISECTOMY;  Surgeon: Hiram Gash, MD;  Location: [facility];  Service: Orthopedics;  Laterality: Left;   WISDOM TOOTH EXTRACTION     Patient Active Problem List   Diagnosis Date Noted   Abnormal laboratory test 04/21/2018   Multiple sclerosis ([place]) 10/25/2017   Low back pain 12/09/2013   Pregnancy induced hypertension    Anxiety    MS (multiple sclerosis) (HCC)    Lower extremity numbness 02/11/2012   Tobacco abuse 02/11/2012   Depression 01/23/2011   SVD (spontaneous vaginal delivery) 01/22/2011    REFERRING DIAG: Left allograft ACL reconstruction with tibialis anterior , MCL  reconstruction Manipulation  ander anesthesia ,Partial medial meniscectomy1/05/2021  THERAPY DIAG:  Acute pain of left knee  Muscle weakness (generalized)  Other abnormalities of gait and mobility  PERTINENT HISTORY: Pt is s/p L ACL reconstruction w/ tibialis anterior, MCL reconstruction, partial medial meniscectomy and manipulation under anesthesia  PRECAUTIONS: Surgery 01-26-21 Knee; 0-6wks TDWB; Left Knee 0-90deg with hinged knee brace locked in extension  SUBJECTIVE:  Pt presents to  PT with reports of HEP compliance and no current pain noted. Demonstrates improved ambulation with axillary crutches and TDWB on LLE. Pt is ready to begin PT at this time.     PAIN:  Are you having pain? No  NPRS scale: 0/10 (7/10 at worst) Pain location: L Knee PAIN TYPE: aching Pain description: intermittent  Aggravating factors: transfers Relieving factors: ice, tylenol      OBJECTIVE: All objective information taken at eval unless otherwise noted   PATIENT SURVEYS:  LEFS 16/80   LE AROM/PROM:   A/PROM Left 02/09/2021 Left  02/16/2021 Left  02/22/2021 Left  02/28/2021  Knee flexion 40 45 47 53  Knee extension _0 0   (Blank rows = not tested)   LE MMT:   MMT Right 02/09/2021 Left 02/09/2021  Hip flexion [facility] DNT  Hip abduction [facility] DNT  Hip adduction [facility] DNT  Knee flexion [facility] DNT  Knee extension WFL DNT   (Blank rows = not tested)   TODAY'S TREATMENT:  [facility] Adult PT  Treatment:                                                DATE: 02-27-2021 Therapeutic Exercise: L quad set x 20  - 5" with towel roll at ankle Long sitting heel slide with strap 4x10 - 5" Seated heel slides 3x10 - 5" L knee flexion wall slide x 3 min  In locked extension brace SLR 4x15 Sidelying hip abduction L LE 2x10 (in locked extension brace) Modalites  Russian Stim to Left quad with instructon for quad set for ramp ups for 12 minutes Pulse duration 454m, freg 37 Hz, ramp up 2 sec ontime 10 sec on/off     [facility] Adult PT Treatment:                                                DATE: 02-22-2021 Therapeutic Exercise: L quad set x 20  - 5" with towel roll at ankle L heel slide  3 x 10 - 5" Long sitting heel slide with strap 4x10 - 5" L ankle pumps x 20 In locked extension brace SLR 3 x 10 Modalites  Russian Stim to Left quad with instructon for quad set for ramp ups for 15 minutes Pulse duration 4047m freg 50 Hz, ramp up 2 sec ontime 15 sec off time 50 sec    [facility] Adult PT Treatment:                                                DATE: 02-16-2021 Therapeutic Exercise: L quad set x 20  - 5" with towel roll at ankle Supine L heel slide  3 x 10 - 5" L ankle pumps x 20 In locked extension brace SLR 3 x 10 GAIT-  Educated pt with proper use of crutches and TDWB L  Pt entered clinic NWB L and tentative about any wt bearing in L with step to gait.  Pt practiced for 250 ft and gained more confident, step through gait pattern Manual Therapy: Educated pt in patellar mobs to perform every day for 5 to 10 min.  Pt returned demo after instruction.  Concentrated on Inf and sup patellar mobs Retrograde massage for Left knee for edema.  Pt with increased tissue tension around patellar tuberosity  in a band. Pt may have been using compressive ace bandage too tightly Modalites  [place] to Left quad with instructon for quad set for ramp ups for 15 minutes Pulse duration [id number] 27 Hz, ramp up 2 sec ontime 10 on and 10 off    PATIENT EDUCATION:  Education details: updated Person educated: Patient Education method: Explanation, Demonstration, and Handouts Education comprehension: verbalized understanding, returned demonstration, and needs further education     HOME EXERCISE PROGRAM: Access Code: [email or web address]: [email or web address] Date: 02/28/2021 Prepared by: DavOctavio Mannsxercises Long Sitting Quad Set - 8 x daily - 7 x weekly - 2-3 sets - 10 reps - 5 sec hold Supine  Ankle Pumps - 8 x daily - 7 x weekly - 20-40 reps Supine Heel Slide - 8 x daily -  7 x weekly - 2-3 sets - 10 reps - 5 sec hold Active Straight Leg Raise with Quad Set - 1 x daily - 7 x weekly - 3 sets - 10 reps Long Sitting Inferior Patellar Glide - 1 x daily - 7 x weekly - 1 sets - patellar self mobs 5-10 min daily Long Sitting Superior Patellar Glide - 1 x daily - 7 x weekly - 1 sets - 5-10 min patellar mob Supine Heel Slide with Strap - 8 x daily - 7 x weekly - 3 sets - 10 reps - 5 sec hold Supine Knee Flexion Wall Slide - 1 x daily - 7 x weekly - 3 min hold    ASSESSMENT:   CLINICAL IMPRESSION: Pt was able to complete all prescribed exercises with no adverse effect. Therapy today focused on improving L knee flexion, quad strength, and overall function per protocol. She has continued lack of L knee flexion that is her current most significant deficit post surgery and limiting continued progression through protocol. HEP updated for lower load and longer duration stretch. She continues to benefit from skilled PT services and will continue to be seen and progressed as tolerated.      GOALS: Goals reviewed with patient? No   SHORT TERM GOALS:   STG Name Target Date Goal status  1 Pt will be compliant with initial HEP for improved comfort and function Baseline: initial HEP given 03/02/2021 MET  2 Pt will self report L knee pain no greater than 3/10 for improved comfort and functional ability  Baseline: 7/10 at worst 03/02/2021 MET  3 Pt will improve L knee AROM to range of 0-90 degrees for improved functional mobility and progression Baseline: 6-40 degrees 03/02/2021 INITIAL  4 Pt will be able to perform SLR with no L knee quad lag for improved strength and progression in protocol Baseline: unable 03/02/2021 INITIAL    LONG TERM GOALS:    LTG Name Target Date Goal status  1 Pt will improve LEFS to no less than 55/80 as proxy for functional improvement Baseline: 16/80 05/04/2021 INITIAL  2 Pt  will self report L LE pain no greater than 1/10 for improved comfort and functional ability  Baseline: 7/10 at worst 05/04/2021 INITIAL  3 Pt will improve L knee flexion to no less than 120 degrees for improved comfort and functional mobility Baseline: 40 degrees 05/04/2021 INITIAL  4 Pt will be able to stand in SLS on L LE for at least 30" for improved balance and stability Baseline: unable 05/04/2021 INITIAL  5 Pt will perform at least 10 reps in 30 Second Sit to Stand test for improved balance and mobility Baseline: unable to perform 05/04/2021 INITIAL    PLAN: PT FREQUENCY: 2x/week   PT DURATION: 12 weeks   PLANNED INTERVENTIONS: Therapeutic exercises, Therapeutic activity, Neuro Muscular re-education, Balance training, Gait training, Patient/Family education, Joint mobilization, Dry Needling, Electrical stimulation, Cryotherapy, Moist heat, Vasopneumatic device, and Manual therapy   PLAN FOR NEXT SESSION: assess response to HEP and self patell possible ESTIM for increase L quad contraction , show pt how to unlock brace to 90 for exercise heel slides to 90   Ward Chatters, PT 02/28/21 10:16 AM

## 2021-03-02 ENCOUNTER — Ambulatory Visit: Payer: 59

## 2021-03-07 ENCOUNTER — Ambulatory Visit: Payer: 59

## 2021-03-07 ENCOUNTER — Other Ambulatory Visit: Payer: Self-pay

## 2021-03-07 DIAGNOSIS — M6281 Muscle weakness (generalized): Secondary | ICD-10-CM

## 2021-03-07 DIAGNOSIS — M25562 Pain in left knee: Secondary | ICD-10-CM | POA: Diagnosis not present

## 2021-03-07 DIAGNOSIS — R2689 Other abnormalities of gait and mobility: Secondary | ICD-10-CM

## 2021-03-07 NOTE — Therapy (Signed)
OUTPATIENT PHYSICAL THERAPY TREATMENT NOTE   Patient Name: Patricia Jensen MRN: [id number] DOB:20-Aug-1980, 41 y.o., female Today's Date: 03/07/2021  PCP: Concepcion Living, MD REFERRING PROVIDER: Reinaldo Berber *   PT End of Session - 03/07/21 0945     Visit Number 5    Number of Visits 25    Date for PT Re-Evaluation 05/04/21    Authorization Type [employer]    PT Start Time 1000    PT Stop Time 1035   game ready 10 minutes post session   PT Time Calculation (min) 35 min    Activity Tolerance Patient tolerated treatment well    Behavior During Therapy WFL for tasks assessed/performed              Past Medical History:  Diagnosis Date   Anxiety    Depression    Keratoconus    MS (multiple sclerosis) ([place])    Pregnancy induced hypertension    Past Surgical History:  Procedure Laterality Date   KNEE ARTHROSCOPY WITH ANTERIOR CRUCIATE LIGAMENT (ACL) REPAIR Left 01/26/2021   Procedure: KNEE ARTHROSCOPY WITH ANTERIOR CRUCIATE LIGAMENT (ACL) REPAIR;  Surgeon: Hiram Gash, MD;  Location: [place];  Service: Orthopedics;  Laterality: Left;   KNEE ARTHROSCOPY WITH MEDIAL COLLATERAL LIGAMENT RECONSTRUCTION Left 01/26/2021   Procedure: KNEE ARTHROSCOPY WITH MEDIAL COLLATERAL LIGAMENT RECONSTRUCTION WITH MEDIAL PARTIAL MENISECTOMY;  Surgeon: Hiram Gash, MD;  Location: [place];  Service: Orthopedics;  Laterality: Left;   WISDOM TOOTH EXTRACTION     Patient Active Problem List   Diagnosis Date Noted   Abnormal laboratory test 04/21/2018   Multiple sclerosis ([place]) 10/25/2017   Low back pain 12/09/2013   Pregnancy induced hypertension    Anxiety    MS (multiple sclerosis) ([facility])    Lower extremity numbness 02/11/2012   Tobacco abuse 02/11/2012   Depression 01/23/2011   SVD (spontaneous vaginal delivery) 01/22/2011    REFERRING DIAG: Left allograft ACL reconstruction with tibialis anterior , MCL reconstruction  Manipulation  ander anesthesia ,Partial medial meniscectomy1/05/2021  THERAPY DIAG:  Acute pain of left knee  Muscle weakness (generalized)  Other abnormalities of gait and mobility  PERTINENT HISTORY: Pt is s/p L ACL reconstruction w/ tibialis anterior, MCL reconstruction, partial medial meniscectomy and manipulation under anesthesia  PRECAUTIONS: Surgery 01-26-21 Knee; 0-6wks TDWB; Left Knee 0-90deg with hinged knee brace locked in extension  SUBJECTIVE:  Pt presents to PT with reports of increased L knee pain. Believes she overdid it with her HEP working on knee flexion. Pt has follow up visit on 03/10/2021 with Dr. Griffin Basil. Pt is ready to begin PT treatment at this time.     PAIN:  Are you having pain? No  NPRS scale: 6/10  Pain location: L Knee PAIN TYPE: aching Pain description: intermittent  Aggravating factors: transfers Relieving factors: ice, tylenol      OBJECTIVE: All objective information taken at eval unless otherwise noted   PATIENT SURVEYS:  LEFS 16/80   LE AROM/PROM:   A/PROM Left 02/09/2021 Left  02/16/2021 Left  02/22/2021 Left  02/28/2021 Left 03/07/2021  Knee flexion 40 45 47 53 57  Knee extension _0 0    (Blank rows = not tested)   LE MMT:   MMT Right 02/09/2021 Left 02/09/2021  Hip flexion [facility] DNT  Hip abduction [facility] DNT  Hip adduction [facility] DNT  Knee flexion [facility] DNT  Knee extension WFL DNT   (Blank rows =  not tested)   TODAY'S TREATMENT:  [facility] Adult PT Treatment:                                                DATE: 03-07-2021 Therapeutic Exercise: L quad set x 20  - 5" with towel roll at ankle Long sitting heel slide with strap 3x10 - 5" Seated heel slides 3x10 - 5" L knee flexion wall slide x 3 min  In locked extension brace SLR 3x15 Sidelying hip abduction L LE 3x10 (in locked extension brace) Modalites  GameReady: Low compression; 36deg, L LE x 10 min  [facility] Adult PT Treatment:                                                DATE:  02-27-2021 Therapeutic Exercise: L quad set x 20  - 5" with towel roll at ankle Long sitting heel slide with strap 4x10 - 5" Seated heel slides 3x10 - 5" L knee flexion wall slide x 3 min  In locked extension brace SLR 4x15 Sidelying hip abduction L LE 2x10 (in locked extension brace) Modalites  Russian Stim to Left quad with instructon for quad set for ramp ups for 12 minutes Pulse duration 427m, freg 37 Hz, ramp up 2 sec ontime 10 sec on/off    [facility] Adult PT Treatment:                                                DATE: 02-22-2021 Therapeutic Exercise: L quad set x 20  - 5" with towel roll at ankle L heel slide  3 x 10 - 5" Long sitting heel slide with strap 4x10 - 5" L ankle pumps x 20 In locked extension brace SLR 3 x 10 Modalites  Russian Stim to Left quad with instructon for quad set for ramp ups for 15 minutes Pulse duration 4051m freg 50 Hz, ramp up 2 sec ontime 15 sec off time 50 sec   PATIENT EDUCATION:  Education details: updated Person educated: Patient Education method: ExConsulting civil engineerDemonstration, and Handouts Education comprehension: verbalized understanding, returned demonstration, and needs further education     HOME EXERCISE PROGRAM: Access Code: [email or web address]: [email or web address] Date: 02/28/2021 Prepared by: DaOctavio MannsExercises Long Sitting Quad Set - 8 x daily - 7 x weekly - 2-3 sets - 10 reps - 5 sec hold Supine Ankle Pumps - 8 x daily - 7 x weekly - 20-40 reps Supine Heel Slide - 8 x daily - 7 x weekly - 2-3 sets - 10 reps - 5 sec hold Active Straight Leg Raise with Quad Set - 1 x daily - 7 x weekly - 3 sets - 10 reps Long Sitting Inferior Patellar Glide - 1 x daily - 7 x weekly - 1 sets - patellar self mobs 5-10 min daily Long Sitting Superior Patellar Glide - 1 x daily - 7 x weekly - 1 sets - 5-10 min patellar mob Supine Heel Slide with Strap - 8 x daily - 7 x weekly - 3 sets - 10 reps - 5 sec hold Supine Knee Flexion  Wall Slide - 1  x daily - 7 x weekly - 3 min hold    ASSESSMENT: Pt was able to complete all prescribed exercises with no adverse effect. Therapy today continued to focus on improving L knee flexion and general mobility. Pt continues to benefit and require skilled PT services and should continue to be seen and progressed as tolerated.      GOALS: Goals reviewed with patient? No   SHORT TERM GOALS:   STG Name Target Date Goal status  1 Pt will be compliant with initial HEP for improved comfort and function Baseline: initial HEP given 03/02/2021 MET  2 Pt will self report L knee pain no greater than 3/10 for improved comfort and functional ability  Baseline: 7/10 at worst 03/02/2021 MET  3 Pt will improve L knee AROM to range of 0-90 degrees for improved functional mobility and progression Baseline: 6-40 degrees 03/02/2021 INITIAL  4 Pt will be able to perform SLR with no L knee quad lag for improved strength and progression in protocol Baseline: unable 03/02/2021 INITIAL    LONG TERM GOALS:    LTG Name Target Date Goal status  1 Pt will improve LEFS to no less than 55/80 as proxy for functional improvement Baseline: 16/80 05/04/2021 INITIAL  2 Pt will self report L LE pain no greater than 1/10 for improved comfort and functional ability  Baseline: 7/10 at worst 05/04/2021 INITIAL  3 Pt will improve L knee flexion to no less than 120 degrees for improved comfort and functional mobility Baseline: 40 degrees 05/04/2021 INITIAL  4 Pt will be able to stand in SLS on L LE for at least 30" for improved balance and stability Baseline: unable 05/04/2021 INITIAL  5 Pt will perform at least 10 reps in 30 Second Sit to Stand test for improved balance and mobility Baseline: unable to perform 05/04/2021 INITIAL    PLAN: PT FREQUENCY: 2x/week   PT DURATION: 12 weeks   PLANNED INTERVENTIONS: Therapeutic exercises, Therapeutic activity, Neuro Muscular re-education, Balance training, Gait training, Patient/Family  education, Joint mobilization, Dry Needling, Electrical stimulation, Cryotherapy, Moist heat, Vasopneumatic device, and Manual therapy   PLAN FOR NEXT SESSION: assess response to HEP and self patell possible ESTIM for increase L quad contraction , show pt how to unlock brace to 90 for exercise heel slides to 90   Ward Chatters, PT 03/07/21 11:37 AM

## 2021-03-09 ENCOUNTER — Ambulatory Visit: Payer: 59

## 2021-03-14 ENCOUNTER — Ambulatory Visit: Payer: 59

## 2021-03-15 ENCOUNTER — Other Ambulatory Visit: Payer: Self-pay

## 2021-03-15 ENCOUNTER — Ambulatory Visit: Payer: 59

## 2021-03-15 DIAGNOSIS — M25562 Pain in left knee: Secondary | ICD-10-CM

## 2021-03-15 DIAGNOSIS — M6281 Muscle weakness (generalized): Secondary | ICD-10-CM

## 2021-03-15 NOTE — Therapy (Signed)
OUTPATIENT PHYSICAL THERAPY TREATMENT NOTE   Patient Name: Patricia Jensen MRN: [id number] DOB:05/02/1980, 41 y.o., female Today's Date: 03/15/2021  PCP: Concepcion Living, MD REFERRING PROVIDER: Hiram Gash, MD   PT End of Session - 03/15/21 1145     Visit Number 6    Number of Visits 25    Date for PT Re-Evaluation 05/04/21    Authorization Type MCD [employer]    PT Start Time 1212    PT Stop Time 1255    PT Time Calculation (min) 43 min    Activity Tolerance Patient tolerated treatment well    Behavior During Therapy WFL for tasks assessed/performed               Past Medical History:  Diagnosis Date   Anxiety    Depression    Keratoconus    MS (multiple sclerosis) ([place])    Pregnancy induced hypertension    Past Surgical History:  Procedure Laterality Date   KNEE ARTHROSCOPY WITH ANTERIOR CRUCIATE LIGAMENT (ACL) REPAIR Left 01/26/2021   Procedure: KNEE ARTHROSCOPY WITH ANTERIOR CRUCIATE LIGAMENT (ACL) REPAIR;  Surgeon: Hiram Gash, MD;  Location: [place];  Service: Orthopedics;  Laterality: Left;   KNEE ARTHROSCOPY WITH MEDIAL COLLATERAL LIGAMENT RECONSTRUCTION Left 01/26/2021   Procedure: KNEE ARTHROSCOPY WITH MEDIAL COLLATERAL LIGAMENT RECONSTRUCTION WITH MEDIAL PARTIAL MENISECTOMY;  Surgeon: Hiram Gash, MD;  Location: [place];  Service: Orthopedics;  Laterality: Left;   WISDOM TOOTH EXTRACTION     Patient Active Problem List   Diagnosis Date Noted   Abnormal laboratory test 04/21/2018   Multiple sclerosis ([place]) 10/25/2017   Low back pain 12/09/2013   Pregnancy induced hypertension    Anxiety    MS (multiple sclerosis) ([facility])    Lower extremity numbness 02/11/2012   Tobacco abuse 02/11/2012   Depression 01/23/2011   SVD (spontaneous vaginal delivery) 01/22/2011    REFERRING DIAG: Left allograft ACL reconstruction with tibialis anterior , MCL reconstruction Manipulation  ander anesthesia ,Partial medial  meniscectomy1/05/2021  THERAPY DIAG:  Acute pain of left knee  Muscle weakness (generalized)  PERTINENT HISTORY: Pt is s/p L ACL reconstruction w/ tibialis anterior, MCL reconstruction, partial medial meniscectomy and manipulation under anesthesia  PRECAUTIONS: Surgery 01-26-21 Knee; 0-6wks TDWB; Left Knee 0-90deg with hinged knee brace locked in extension  SUBJECTIVE:  Pt presents to PT after f/u visit with orthopedics. Has been compliant with HEP with no adverse effect. Ortho told pt her extension is looking good, however she is limited in L knee flexion post surgery. Pt is ready to begin PT treatment at this time.     Pain: Are you having pain? No  NPRS scale: 4/10  Pain location: L Knee PAIN TYPE: aching Pain description: intermittent  Aggravating factors: transfers Relieving factors: ice, tylenol      OBJECTIVE: All objective information taken at eval unless otherwise noted   PATIENT SURVEYS:  LEFS 16/80   LE AROM/PROM:   A/PROM Left 02/09/2021 Left  02/16/2021 Left  02/22/2021 Left  02/28/2021 Left 03/07/2021  Knee flexion 40 45 47 53 57  Knee extension '6 6 1 ' 0    (Blank rows = not tested)   LE MMT:   MMT Right 02/09/2021 Left 02/09/2021  Hip flexion [facility] DNT  Hip abduction [facility] DNT  Hip adduction [facility] DNT  Knee flexion [facility] DNT  Knee extension WFL DNT   (Blank rows = not tested)   TODAY'S TREATMENT:  [facility] Adult PT Treatment:  DATE: 03-15-2021 Therapeutic Exercise: L quad set x 20  - 5" with towel roll at ankle Long sitting heel slide with strap 3x10 - 5" Supine hamstring isometric 2x10 - 5"  L knee TKE with isometric quad contraction RTB 2x10 L knee flexion wall slide x 3 min  L SLR 3x15 locked extension brace Sidelying hip abduction L LE 3x15 (in locked extension brace) Manual Therapy: PROM L knee flexion Pump handle mob L knee flexion Modalites  GameReady: Low compression; 36deg, L LE x 10 min  [facility] Adult  PT Treatment:                                                DATE: 03-07-2021 Therapeutic Exercise: L quad set x 20  - 5" with towel roll at ankle Long sitting heel slide with strap 3x10 - 5" Seated heel slides 3x10 - 5" L knee flexion wall slide x 3 min  In locked extension brace SLR 3x15 Sidelying hip abduction L LE 3x10 (in locked extension brace) Modalites  GameReady: Low compression; 36deg, L LE x 10 min   PATIENT EDUCATION:  Education details: updated Person educated: Patient Education method: Consulting civil engineer, Demonstration, and Handouts Education comprehension: verbalized understanding, returned demonstration, and needs further education     HOME EXERCISE PROGRAM: Access Code: [email or web address] URL: [email or web address] Date: 02/28/2021 Prepared by: Octavio Manns  Exercises Long Sitting Quad Set - 8 x daily - 7 x weekly - 2-3 sets - 10 reps - 5 sec hold Supine Ankle Pumps - 8 x daily - 7 x weekly - 20-40 reps Supine Heel Slide - 8 x daily - 7 x weekly - 2-3 sets - 10 reps - 5 sec hold Active Straight Leg Raise with Quad Set - 1 x daily - 7 x weekly - 3 sets - 10 reps Long Sitting Inferior Patellar Glide - 1 x daily - 7 x weekly - 1 sets - patellar self mobs 5-10 min daily Long Sitting Superior Patellar Glide - 1 x daily - 7 x weekly - 1 sets - 5-10 min patellar mob Supine Heel Slide with Strap - 8 x daily - 7 x weekly - 3 sets - 10 reps - 5 sec hold Supine Knee Flexion Wall Slide - 1 x daily - 7 x weekly - 3 min hold    ASSESSMENT: Pt was able to complete all prescribed exercises with no adverse effect. Therapy today continued to focus on improving L knee flexion post surgery, while also progressing isometric quad and hamstring strengthening exercises. Pt continues to require skilled PT services and should continue to be seen and progressed as tolerated.      GOALS: Goals reviewed with patient? No   SHORT TERM GOALS:   STG Name Target Date Goal status  1 Pt will  be compliant with initial HEP for improved comfort and function Baseline: initial HEP given 03/02/2021 MET  2 Pt will self report L knee pain no greater than 3/10 for improved comfort and functional ability  Baseline: 7/10 at worst 03/02/2021 MET  3 Pt will improve L knee AROM to range of 0-90 degrees for improved functional mobility and progression Baseline: 6-40 degrees 03/02/2021 ONGOING  4 Pt will be able to perform SLR with no L knee quad lag for improved strength and progression in protocol Baseline: unable 03/02/2021 ONGOING    LONG TERM  GOALS:    LTG Name Target Date Goal status  1 Pt will improve LEFS to no less than 55/80 as proxy for functional improvement Baseline: 16/80 05/04/2021 INITIAL  2 Pt will self report L LE pain no greater than 1/10 for improved comfort and functional ability  Baseline: 7/10 at worst 05/04/2021 INITIAL  3 Pt will improve L knee flexion to no less than 120 degrees for improved comfort and functional mobility Baseline: 40 degrees 05/04/2021 INITIAL  4 Pt will be able to stand in SLS on L LE for at least 30" for improved balance and stability Baseline: unable 05/04/2021 INITIAL  5 Pt will perform at least 10 reps in 30 Second Sit to Stand test for improved balance and mobility Baseline: unable to perform 05/04/2021 INITIAL    PLAN: PT FREQUENCY: 2x/week   PT DURATION: 12 weeks   PLANNED INTERVENTIONS: Therapeutic exercises, Therapeutic activity, Neuro Muscular re-education, Balance training, Gait training, Patient/Family education, Joint mobilization, Dry Needling, Electrical stimulation, Cryotherapy, Moist heat, Vasopneumatic device, and Manual therapy   PLAN FOR NEXT SESSION: assess response to HEP and self patell possible ESTIM for increase L quad contraction , show pt how to unlock brace to 90 for exercise heel slides to 90   Ward Chatters, PT 03/15/21 1:56 PM

## 2021-03-16 ENCOUNTER — Ambulatory Visit: Payer: 59

## 2021-03-18 ENCOUNTER — Ambulatory Visit: Payer: 59

## 2021-03-18 NOTE — Therapy (Unsigned)
OUTPATIENT PHYSICAL THERAPY TREATMENT NOTE   Patient Name: Patricia Jensen MRN: [id number] DOB:06-15-1980, 41 y.o., female Today's Date: 03/18/2021  PCP: Concepcion Living, MD REFERRING PROVIDER: Hiram Gash, MD       Past Medical History:  Diagnosis Date   Anxiety    Depression    Keratoconus    MS (multiple sclerosis) ([place])    Pregnancy induced hypertension    Past Surgical History:  Procedure Laterality Date   KNEE ARTHROSCOPY WITH ANTERIOR CRUCIATE LIGAMENT (ACL) REPAIR Left 01/26/2021   Procedure: KNEE ARTHROSCOPY WITH ANTERIOR CRUCIATE LIGAMENT (ACL) REPAIR;  Surgeon: Hiram Gash, MD;  Location: [place];  Service: Orthopedics;  Laterality: Left;   KNEE ARTHROSCOPY WITH MEDIAL COLLATERAL LIGAMENT RECONSTRUCTION Left 01/26/2021   Procedure: KNEE ARTHROSCOPY WITH MEDIAL COLLATERAL LIGAMENT RECONSTRUCTION WITH MEDIAL PARTIAL MENISECTOMY;  Surgeon: Hiram Gash, MD;  Location: [place];  Service: Orthopedics;  Laterality: Left;   WISDOM TOOTH EXTRACTION     Patient Active Problem List   Diagnosis Date Noted   Abnormal laboratory test 04/21/2018   Multiple sclerosis ([place]) 10/25/2017   Low back pain 12/09/2013   Pregnancy induced hypertension    Anxiety    MS (multiple sclerosis) ([place])    Lower extremity numbness 02/11/2012   Tobacco abuse 02/11/2012   Depression 01/23/2011   SVD (spontaneous vaginal delivery) 01/22/2011    REFERRING DIAG: Left allograft ACL reconstruction with tibialis anterior , MCL reconstruction Manipulation  ander anesthesia ,Partial medial meniscectomy1/05/2021  THERAPY DIAG:  No diagnosis found.  PERTINENT HISTORY: Pt is s/p L ACL reconstruction w/ tibialis anterior, MCL reconstruction, partial medial meniscectomy and manipulation under anesthesia  PRECAUTIONS: Surgery 01-26-21 Knee; 0-6wks TDWB; Left Knee 0-90deg with hinged knee brace locked in extension  SUBJECTIVE:  Pt presents to PT after f/u  visit with orthopedics. Has been compliant with HEP with no adverse effect. Ortho told pt her extension is looking good, however she is limited in L knee flexion post surgery. Pt is ready to begin PT treatment at this time.     Pain: Are you having pain? No  NPRS scale: 4/10  Pain location: L Knee PAIN TYPE: aching Pain description: intermittent  Aggravating factors: transfers Relieving factors: ice, tylenol      OBJECTIVE: All objective information taken at eval unless otherwise noted   PATIENT SURVEYS:  LEFS 16/80   LE AROM/PROM:   A/PROM Left 02/09/2021 Left  02/16/2021 Left  02/22/2021 Left  02/28/2021 Left 03/07/2021  Knee flexion 40 45 47 53 57  Knee extension '6 6 1 ' 0    (Blank rows = not tested)   LE MMT:   MMT Right 02/09/2021 Left 02/09/2021  Hip flexion [facility] DNT  Hip abduction [facility] DNT  Hip adduction [facility] DNT  Knee flexion [facility] DNT  Knee extension WFL DNT   (Blank rows = not tested)   TODAY'S TREATMENT:  [facility] Adult PT Treatment:                                                DATE: 03/18/2021 Therapeutic Exercise: L quad set x 20  - 5" with towel roll at ankle Long sitting heel slide with strap 3x10 - 5" Supine hamstring isometric 2x10 - 5"  L knee TKE with isometric quad contraction RTB 2x10 L knee flexion wall slide  x 3 min  L SLR 3x15 locked extension brace Sidelying hip abduction L LE 3x15 (in locked extension brace) Manual Therapy: PROM L knee flexion Modalites  GameReady: Low compression; 36deg, L LE x 10 min   [facility] Adult PT Treatment:                                                DATE: 03-15-2021 Therapeutic Exercise: L quad set x 20  - 5" with towel roll at ankle Long sitting heel slide with strap 3x10 - 5" Supine hamstring isometric 2x10 - 5"  L knee TKE with isometric quad contraction RTB 2x10 L knee flexion wall slide x 3 min  L SLR 3x15 locked extension brace Sidelying hip abduction L LE 3x15 (in locked extension brace) Manual  Therapy: PROM L knee flexion Pump handle mob L knee flexion Modalites  GameReady: Low compression; 36deg, L LE x 10 min  [facility] Adult PT Treatment:                                                DATE: 03-07-2021 Therapeutic Exercise: L quad set x 20  - 5" with towel roll at ankle Long sitting heel slide with strap 3x10 - 5" Seated heel slides 3x10 - 5" L knee flexion wall slide x 3 min  In locked extension brace SLR 3x15 Sidelying hip abduction L LE 3x10 (in locked extension brace) Modalites  GameReady: Low compression; 36deg, L LE x 10 min   PATIENT EDUCATION:  Education details: updated Person educated: Patient Education method: Consulting civil engineer, Demonstration, and Handouts Education comprehension: verbalized understanding, returned demonstration, and needs further education     HOME EXERCISE PROGRAM: Access Code: [email or web address] URL: [email or web address] Date: 02/28/2021 Prepared by: Octavio Manns  Exercises Long Sitting Quad Set - 8 x daily - 7 x weekly - 2-3 sets - 10 reps - 5 sec hold Supine Ankle Pumps - 8 x daily - 7 x weekly - 20-40 reps Supine Heel Slide - 8 x daily - 7 x weekly - 2-3 sets - 10 reps - 5 sec hold Active Straight Leg Raise with Quad Set - 1 x daily - 7 x weekly - 3 sets - 10 reps Long Sitting Inferior Patellar Glide - 1 x daily - 7 x weekly - 1 sets - patellar self mobs 5-10 min daily Long Sitting Superior Patellar Glide - 1 x daily - 7 x weekly - 1 sets - 5-10 min patellar mob Supine Heel Slide with Strap - 8 x daily - 7 x weekly - 3 sets - 10 reps - 5 sec hold Supine Knee Flexion Wall Slide - 1 x daily - 7 x weekly - 3 min hold    ASSESSMENT: ***  Pt was able to complete all prescribed exercises with no adverse effect. Therapy today continued to focus on improving L knee flexion post surgery, while also progressing isometric quad and hamstring strengthening exercises. Pt continues to require skilled PT services and should continue to be seen and  progressed as tolerated.      GOALS: Goals reviewed with patient? No   SHORT TERM GOALS:   STG Name Target Date Goal status  1 Pt will be compliant with initial HEP  for improved comfort and function Baseline: initial HEP given 03/02/2021 MET  2 Pt will self report L knee pain no greater than 3/10 for improved comfort and functional ability  Baseline: 7/10 at worst 03/02/2021 MET  3 Pt will improve L knee AROM to range of 0-90 degrees for improved functional mobility and progression Baseline: 6-40 degrees 03/02/2021 ONGOING  4 Pt will be able to perform SLR with no L knee quad lag for improved strength and progression in protocol Baseline: unable 03/02/2021 ONGOING    LONG TERM GOALS:    LTG Name Target Date Goal status  1 Pt will improve LEFS to no less than 55/80 as proxy for functional improvement Baseline: 16/80 05/04/2021 INITIAL  2 Pt will self report L LE pain no greater than 1/10 for improved comfort and functional ability  Baseline: 7/10 at worst 05/04/2021 INITIAL  3 Pt will improve L knee flexion to no less than 120 degrees for improved comfort and functional mobility Baseline: 40 degrees 05/04/2021 INITIAL  4 Pt will be able to stand in SLS on L LE for at least 30" for improved balance and stability Baseline: unable 05/04/2021 INITIAL  5 Pt will perform at least 10 reps in 30 Second Sit to Stand test for improved balance and mobility Baseline: unable to perform 05/04/2021 INITIAL    PLAN: PT FREQUENCY: 2x/week   PT DURATION: 12 weeks   PLANNED INTERVENTIONS: Therapeutic exercises, Therapeutic activity, Neuro Muscular re-education, Balance training, Gait training, Patient/Family education, Joint mobilization, Dry Needling, Electrical stimulation, Cryotherapy, Moist heat, Vasopneumatic device, and Manual therapy   PLAN FOR NEXT SESSION: *** assess response to HEP and self patell possible ESTIM for increase L quad contraction , show pt how to unlock brace to 90 for exercise heel  slides to [address], PTA 03/18/21 7:52 AM

## 2021-03-22 DIAGNOSIS — Z419 Encounter for procedure for purposes other than remedying health state, unspecified: Secondary | ICD-10-CM | POA: Diagnosis not present

## 2021-04-03 ENCOUNTER — Ambulatory Visit: Payer: 59

## 2021-04-10 ENCOUNTER — Ambulatory Visit: Payer: 59

## 2021-04-17 ENCOUNTER — Other Ambulatory Visit: Payer: Self-pay

## 2021-04-17 ENCOUNTER — Ambulatory Visit: Payer: 59 | Attending: Orthopaedic Surgery

## 2021-04-17 DIAGNOSIS — M25562 Pain in left knee: Secondary | ICD-10-CM | POA: Insufficient documentation

## 2021-04-17 DIAGNOSIS — M6281 Muscle weakness (generalized): Secondary | ICD-10-CM | POA: Diagnosis present

## 2021-04-17 DIAGNOSIS — R2689 Other abnormalities of gait and mobility: Secondary | ICD-10-CM | POA: Insufficient documentation

## 2021-04-17 NOTE — Therapy (Signed)
?OUTPATIENT PHYSICAL THERAPY TREATMENT NOTE/RE-EVALUATION ? ? ?Patient Name: Patricia Jensen ?MRN: [id number] ?DOB:[birth year], 41 y.o., female ?Today's Date: 04/17/2021 ? ?PCP: Concepcion Living, MD ?REFERRING PROVIDER: Hiram Gash, MD ? ? PT End of Session - 04/17/21 1123   ? ? Visit Number 7   ? Number of Visits 25   ? Date for PT Re-Evaluation 06/26/21   ? Authorization Type [facility] [employer]   ? Authorization Time Period Re-Auth Submitted   ? PT Start Time 1130   ? PT Stop Time 1210   ? PT Time Calculation (min) 40 min   ? Activity Tolerance Patient tolerated treatment well   ? Behavior During Therapy [facility] for tasks assessed/performed   ? ?  ?  ? ?  ? ? ? ? ? ?Past Medical History:  ?Diagnosis Date  ? Anxiety   ? Depression   ? Keratoconus   ? MS (multiple sclerosis) ([place])   ? Pregnancy induced hypertension   ? ?Past Surgical History:  ?Procedure Laterality Date  ? KNEE ARTHROSCOPY WITH ANTERIOR CRUCIATE LIGAMENT (ACL) REPAIR Left 01/26/2021  ? Procedure: KNEE ARTHROSCOPY WITH ANTERIOR CRUCIATE LIGAMENT (ACL) REPAIR;  Surgeon: Hiram Gash, MD;  Location: [place];  Service: Orthopedics;  Laterality: Left;  ? KNEE ARTHROSCOPY WITH MEDIAL COLLATERAL LIGAMENT RECONSTRUCTION Left 01/26/2021  ? Procedure: KNEE ARTHROSCOPY WITH MEDIAL COLLATERAL LIGAMENT RECONSTRUCTION WITH MEDIAL PARTIAL MENISECTOMY;  Surgeon: Hiram Gash, MD;  Location: [place];  Service: Orthopedics;  Laterality: Left;  ? WISDOM TOOTH EXTRACTION    ? ?Patient Active Problem List  ? Diagnosis Date Noted  ? Abnormal laboratory test 04/21/2018  ? Multiple sclerosis ([place]) 10/25/2017  ? Low back pain 12/09/2013  ? Pregnancy induced hypertension   ? Anxiety   ? MS (multiple sclerosis) ([place])   ? Lower extremity numbness 02/11/2012  ? Tobacco abuse 02/11/2012  ? Depression 01/23/2011  ? SVD (spontaneous vaginal delivery) 01/22/2011  ? ? ?REFERRING DIAG: Left allograft ACL reconstruction with tibialis anterior , MCL  reconstruction Manipulation  ander anesthesia ,Partial medial meniscectomy1/05/2021 ? ?THERAPY DIAG:  ?Acute pain of left knee ? ?Muscle weakness (generalized) ? ?Other abnormalities of gait and mobility ? ?PERTINENT HISTORY: Pt is s/p L ACL reconstruction w/ tibialis anterior, MCL reconstruction, partial medial meniscectomy and manipulation under anesthesia ? ?PRECAUTIONS: Surgery 01-26-21 Knee; 0-6wks TDWB; Left Knee 0-90deg with hinged knee brace locked in extension ? ?SUBJECTIVE:  ?Pt presents to PT after roughly 4 week absence due to her children being sick and transportation issues. with no current reports of L knee pain. Notes that it continues to be very stiff, has been doing a lot of walking and has decreased L knee pain. Continues to use hinged brace, sees Dr. Griffin Basil tomorrow and is hoping to be cleared. She states that they will give her a cortisone injection tomorrow as well. She is anxious to get started back and states she has been depressed about how stiff her knee has been after surgery.  ?  ?Pain: ?Are you having pain? No  ?NPRS scale: 0/10  ?Pain location: L Knee ?PAIN TYPE: aching ?Pain description: intermittent  ?Aggravating factors: transfers ?Relieving factors: ice, tylenol ? ? ?OBJECTIVE: All objective information taken at eval unless otherwise noted ?  ?PATIENT SURVEYS:  ?LEFS 50/80 - 04/17/2021 ?  ?LE AROM/PROM: ?  ?A/PROM Left ?02/09/2021 Left  ?02/16/2021 Left  ?02/22/2021 Left  ?02/28/2021 Left ?03/07/2021 Left ?04/17/2021  ?Knee flexion 40 45 47 53 57  [place]leton[place]nee extension '6 6 1 ' 0  2  ? (Blank rows = not tested) ?  ?LE MMT: ?  ?MMT Right ?02/09/2021 Left ?02/09/2021  ?Hip flexion [facility] DNT  ?Hip abduction [facility] DNT  ?Hip adduction [facility] DNT  ?Knee flexion [facility] DNT  ?Knee extension [facility] DNT  ? (Blank rows = not tested) ?  ?TODAY'S TREATMENT:  ?[facility] Adult PT Treatment: DATE:04/17/2021 ?Therapeutic Exercise: ?Prone quad stretch with strap 3x30" L ?Supine SLR 3x10 with quad set L ?Supine heel slide with strap  x 10 - 5" hold ?L heel slide on wall x 4 min ?Supine hamstring stretch with strap 3x60" ?Therapeutic Activity: ?Assessments of tests/measures, outcomes, and goals for purposes of re-evaluation ? ?[facility] Adult PT Treatment: DATE: 03/15/2021 ?Therapeutic Exercise: ?L quad set x 20  - 5" with towel roll at ankle ?Long sitting heel slide with strap 3x10 - 5" ?Supine hamstring isometric 2x10 - 5"  ?L knee TKE with isometric quad contraction RTB 2x10 ?L knee flexion wall slide x 3 min  ?L SLR 3x15 locked extension brace ?Sidelying hip abduction L LE 3x15 (in locked extension brace) ?Manual Therapy: ?PROM L knee flexion ?Pump handle mob L knee flexion ?Modalites ? GameReady: Low compression; 36deg, L LE x 10 min ? ? ?FUNCTIONAL TESTS: ? 30 Second Sit to Stand: 10 reps with UE support - 3/27/20223 ? SLS: unable ? ?PATIENT EDUCATION:  ?Education details: updated HEP and POC ?Person educated: Patient ?Education method: Explanation, Demonstration, and Handouts ?Education comprehension: verbalized understanding, returned demonstration, and needs further education ?  ?  ?HOME EXERCISE PROGRAM: ?Access Code: [email or web address] ?URL: [email or web address] ?Date: 04/17/2021 ?Prepared by: Octavio Manns ? ?Exercises ?- Active Straight Leg Raise with Quad Set  - 2-3 x daily - 7 x weekly - 3 sets - 10 reps ?- Supine Heel Slide with Strap  - 2-3 x daily - 7 x weekly - 3 sets - 10 reps - 5 sec hold ?- Prone Quadriceps Stretch with Strap  - 2-3 x daily - 7 x weekly - 3 reps - 30 sec hold ?- Supine Knee Flexion Wall Slide  - 2-3 x daily - 7 x weekly - 3 min hold ?- Supine Hamstring Stretch with Strap  - 2-3 x daily - 7 x weekly - 3 sets - 3 reps - 30-60 hold ? ?  ?ASSESSMENT: ?Pt presented to PT after 3-4 weeks with continued limitation in functional mobility and L knee flexion. She has made some progress towards LTGs since starting therapy, with pt improving her LEFS functional ability score to 50/80 today along with being able to perform  30 Second Sit to Stand with use of UE. Her pain is relatively well controlled as her biggist deficits continue to be lack of knee flexion and decrease in L LE strength post surgery. She requires continuation of skilled PT services working on improving L knee flexion and general LE strength and mobility in order to improve functional ability and get back to PLOF. PT will continue to progress exercises as tolerated per POC as prescribed.  ? ?Problem List: ?Abnormal gait, decreased activity tolerance, decreased balance, decreased endurance, decreased mobility, difficulty walking, decreased ROM, decreased strength, and pain ?  ?  ?GOALS: ?Goals reviewed with patient? No ?  ?SHORT TERM GOALS: ?  ?STG Name Target Date Goal status  ?1 Pt will be compliant with initial HEP for improved comfort and function ?Baseline: initial HEP given 03/02/2021 MET  ?2 Pt will self report L knee  pain no greater than 3/10 for improved comfort and functional ability  ?Baseline: 7/10 at worst 03/02/2021 MET  ?3 Pt will improve L knee AROM to range of 0-90 degrees for improved functional mobility and progression ?Baseline: 6-40 degrees 03/02/2021 ONGOING  ?4 Pt will be able to perform SLR with no L knee quad lag for improved strength and progression in protocol ?Baseline: unable 03/02/2021 MET  ?  ?LONG TERM GOALS:  ?  ?LTG Name Target Date Goal status  ?1 Pt will improve LEFS to no less than 55/80 as proxy for functional improvement ?Baseline: 16/80 ?04/17/2021: 50/80 06/26/2021 ONGOING  ?2 Pt will self report L LE pain no greater than 1/10 for improved comfort and functional ability  ?Baseline: 7/10 at worst 06/26/2021 PARTIALLY ?MET  ?3 Pt will improve L knee flexion to no less than 120 degrees for improved comfort and functional mobility ?Baseline: 40 degrees ?04/17/2021: 60 degrees on wall 06/26/2021 ONGOING  ?4 Pt will be able to stand in SLS on L LE for at least 30" for improved balance and stability ?Baseline: unable ?04/17/2021: unable 06/26/2021  ONGOING  ?5 Pt will perform at least 10 reps in 30 Second Sit to Stand test for improved balance and mobility ?Baseline: unable to perform ?04/17/2021: 10 reps with UE support 06/26/2021 PARTIALLY MET  ?  ?PLAN:

## 2021-04-22 DIAGNOSIS — Z419 Encounter for procedure for purposes other than remedying health state, unspecified: Secondary | ICD-10-CM | POA: Diagnosis not present

## 2021-04-24 ENCOUNTER — Ambulatory Visit: Payer: 59

## 2021-04-26 ENCOUNTER — Ambulatory Visit: Payer: 59 | Attending: Orthopaedic Surgery

## 2021-04-26 DIAGNOSIS — M6281 Muscle weakness (generalized): Secondary | ICD-10-CM | POA: Diagnosis present

## 2021-04-26 DIAGNOSIS — M25562 Pain in left knee: Secondary | ICD-10-CM | POA: Insufficient documentation

## 2021-04-26 NOTE — Therapy (Addendum)
OUTPATIENT PHYSICAL THERAPY TREATMENT NOTE/RE-EVALUATION/DISCHARGE  PHYSICAL THERAPY DISCHARGE SUMMARY  Visits from Start of Care: 8  Current functional level related to goals / functional outcomes: Unable to assess   Remaining deficits: Unable to assess   Education / Equipment: N/A   Patient agrees to discharge. Patient goals were  unable to assess . Patient is being discharged due to not returning since the last visit.   Patient Name: Patricia Jensen MRN: [id number] DOB:30-Apr-1980, 41 y.o., female Today's Date: 04/26/2021  PCP: Concepcion Living, MD REFERRING PROVIDER: Hiram Gash, MD   PT End of Session - 04/26/21 0912     Visit Number 8    Number of Visits 25    Date for PT Re-Evaluation 06/26/21    Authorization Type [facility]    Authorization Time Period Re-Auth Submitted    PT Start Time 0915    PT Stop Time 0955    PT Time Calculation (min) 40 min    Activity Tolerance Patient tolerated treatment well    Behavior During Therapy [facility] for tasks assessed/performed                Past Medical History:  Diagnosis Date   Anxiety    Depression    Keratoconus    MS (multiple sclerosis) ([place])    Pregnancy induced hypertension    Past Surgical History:  Procedure Laterality Date   KNEE ARTHROSCOPY WITH ANTERIOR CRUCIATE LIGAMENT (ACL) REPAIR Left 01/26/2021   Procedure: KNEE ARTHROSCOPY WITH ANTERIOR CRUCIATE LIGAMENT (ACL) REPAIR;  Surgeon: Hiram Gash, MD;  Location: [place];  Service: Orthopedics;  Laterality: Left;   KNEE ARTHROSCOPY WITH MEDIAL COLLATERAL LIGAMENT RECONSTRUCTION Left 01/26/2021   Procedure: KNEE ARTHROSCOPY WITH MEDIAL COLLATERAL LIGAMENT RECONSTRUCTION WITH MEDIAL PARTIAL MENISECTOMY;  Surgeon: Hiram Gash, MD;  Location: [place];  Service: Orthopedics;  Laterality: Left;   WISDOM TOOTH EXTRACTION     Patient Active Problem List   Diagnosis Date Noted   Abnormal laboratory test  04/21/2018   Multiple sclerosis ([place]) 10/25/2017   Low back pain 12/09/2013   Pregnancy induced hypertension    Anxiety    MS (multiple sclerosis) ([facility])    Lower extremity numbness 02/11/2012   Tobacco abuse 02/11/2012   Depression 01/23/2011   SVD (spontaneous vaginal delivery) 01/22/2011    REFERRING DIAG: Left allograft ACL reconstruction with tibialis anterior , MCL reconstruction Manipulation  ander anesthesia ,Partial medial meniscectomy1/05/2021  THERAPY DIAG:  Acute pain of left knee  Muscle weakness (generalized)  PERTINENT HISTORY: Pt is s/p L ACL reconstruction w/ tibialis anterior, MCL reconstruction, partial medial meniscectomy and manipulation under anesthesia  PRECAUTIONS: Surgery 01-26-21 Knee; 0-6wks TDWB; Left Knee 0-90deg with hinged knee brace locked in extension  SUBJECTIVE:  Pt presents to PT with no current reports of pain. She had a cortisone injection into L knee last week, MD notes they may have to do a scope to break up scar tissue. She is ready to begin PT treatment at this time.    Pain: Are you having pain? No  NPRS scale: 0/10  Pain location: L Knee PAIN TYPE: aching Pain description: intermittent  Aggravating factors: transfers Relieving factors: ice, tylenol   OBJECTIVE: All objective information taken at eval unless otherwise noted   PATIENT SURVEYS:  LEFS 50/80 - 04/17/2021   LE AROM/PROM:   A/PROM Left 02/09/2021 Left  02/16/2021 Left  02/22/2021 Left  02/28/2021 Left 03/07/2021 Left 04/17/2021  Knee flexion 40  45 47 53 57 60 AAROM  Knee extension _0 0  2   (Blank rows = not tested)   LE MMT:   MMT Right 02/09/2021 Left 02/09/2021  Hip flexion [facility] DNT  Hip abduction [facility] DNT  Hip adduction [facility] DNT  Knee flexion [facility] DNT  Knee extension WFL DNT   (Blank rows = not tested)   TODAY'S TREATMENT:  [facility] Adult PT Treatment: DATE:04/26/2021 Therapeutic Exercise: NuStep lvl 4 UE/LE x 5 min focusing on improving knee flexion  Prone quad  stretch with strap 2x60" L Supine SLR 3x10 with quad set L Supine heel slide with strap 2x10 - 5" hold Seated L hamstring isometric 2x10 - 5" hold Supine hamstring stretch with strap 3x60" Amb with new brace setting 0-60 deg x 295f Standing mini squat 3x10 with UE support  [facility] Adult PT Treatment: DATE:04/17/2021 Therapeutic Exercise: Prone quad stretch with strap 3x30" L Supine SLR 3x10 with quad set L Supine heel slide with strap x 10 - 5" hold L heel slide on wall x 4 min Supine hamstring stretch with strap 3x60" Therapeutic Activity: Assessments of tests/measures, outcomes, and goals for purposes of re-evaluation  [facility] Adult PT Treatment: DATE: 03/15/2021 Therapeutic Exercise: L quad set x 20  - 5" with towel roll at ankle Long sitting heel slide with strap 3x10 - 5" Supine hamstring isometric 2x10 - 5"  L knee TKE with isometric quad contraction RTB 2x10 L knee flexion wall slide x 3 min  L SLR 3x15 locked extension brace Sidelying hip abduction L LE 3x15 (in locked extension brace) Manual Therapy: PROM L knee flexion Pump handle mob L knee flexion Modalites  GameReady: Low compression; 36deg, L LE x 10 min   FUNCTIONAL TESTS:  30 Second Sit to Stand: 10 reps with UE support - 3/27/20223  SLS: unable  PATIENT EDUCATION:  Education details: updated HEP and POC Person educated: Patient Education method: Explanation, Demonstration, and Handouts Education comprehension: verbalized understanding, returned demonstration, and needs further education     HOME EXERCISE PROGRAM: Access Code: [email or web address]: [email or web address] Date: 04/26/2021 Prepared by: DOctavio Manns Exercises - Active Straight Leg Raise with Quad Set  - 2-3 x daily - 7 x weekly - 3 sets - 10 reps - Supine Heel Slide with Strap  - 2-3 x daily - 7 x weekly - 3 sets - 10 reps - 5 sec hold - Prone Quadriceps Stretch with Strap  - 2-3 x daily - 7 x weekly - 3 reps - 30 sec hold - Supine  Knee Flexion Wall Slide  - 2-3 x daily - 7 x weekly - 3 min hold - Supine Hamstring Stretch with Strap  - 2-3 x daily - 7 x weekly - 3 sets - 3 reps - 30-60 hold - Mini Squat with Counter Support  - 2 x daily - 7 x weekly - 3 sets - 10 reps    ASSESSMENT: Pt was able to complete all prescribed exercises with no adverse effect or increase in pain. PT unlocked brace from 0-60 degrees and progressed to closed chain strengthening. Pt continues to benefit from skilled PT services and will continue to be seen and progressed as tolerated per POC.   Problem List: Abnormal gait, decreased activity tolerance, decreased balance, decreased endurance, decreased mobility, difficulty walking, decreased ROM, decreased strength, and pain     GOALS: Goals reviewed with patient? No   SHORT TERM GOALS:   STG Name Target Date Goal status  1 Pt will be compliant with initial HEP for improved comfort and function Baseline: initial HEP given 03/02/2021 MET  2 Pt will self report L knee pain no greater than 3/10 for improved comfort and functional ability  Baseline: 7/10 at worst 03/02/2021 MET  3 Pt will improve L knee AROM to range of 0-90 degrees for improved functional mobility and progression Baseline: 6-40 degrees 03/02/2021 ONGOING  4 Pt will be able to perform SLR with no L knee quad lag for improved strength and progression in protocol Baseline: unable 03/02/2021 MET    LONG TERM GOALS:    LTG Name Target Date Goal status  1 Pt will improve LEFS to no less than 55/80 as proxy for functional improvement Baseline: 16/80 04/17/2021: 50/80 06/26/2021 ONGOING  2 Pt will self report L LE pain no greater than 1/10 for improved comfort and functional ability  Baseline: 7/10 at worst 06/26/2021 PARTIALLY MET  3 Pt will improve L knee flexion to no less than 120 degrees for improved comfort and functional mobility Baseline: 40 degrees 04/17/2021: 60 degrees on wall 06/26/2021 ONGOING  4 Pt will be able to stand in SLS  on L LE for at least 30" for improved balance and stability Baseline: unable 04/17/2021: unable 06/26/2021 ONGOING  5 Pt will perform at least 10 reps in 30 Second Sit to Stand test for improved balance and mobility Baseline: unable to perform 04/17/2021: 10 reps with UE support 06/26/2021 PARTIALLY MET    PLAN: PT FREQUENCY: 2x/week   PT DURATION: 10 weeks   PLANNED INTERVENTIONS: Therapeutic exercises, Therapeutic activity, Neuro Muscular re-education, Balance training, Gait training, Patient/Family education, Joint mobilization, Dry Needling, Electrical stimulation, Cryotherapy, Moist heat, Vasopneumatic device, and Manual therapy   PLAN FOR NEXT SESSION: assess response to HEP, PROM for L knee flexion, progress per protocol as able   Ward Chatters, PT 04/26/21 9:59 AM

## 2021-05-01 ENCOUNTER — Ambulatory Visit: Payer: 59

## 2021-05-01 NOTE — Therapy (Incomplete)
?OUTPATIENT PHYSICAL THERAPY TREATMENT NOTE/RE-EVALUATION ? ? ?Patient Name: Patricia Jensen ?MRN: [id number] ?DOB:September 02, 1980, 41 y.o., female ?Today's Date: 05/01/2021 ? ?PCP: Concepcion Living, MD ?REFERRING PROVIDER: Reinaldo Berber * ? ? ? ? ? ? ? ?Past Medical History:  ?Diagnosis Date  ? Anxiety   ? Depression   ? Keratoconus   ? MS (multiple sclerosis) ([place])   ? Pregnancy induced hypertension   ? ?Past Surgical History:  ?Procedure Laterality Date  ? KNEE ARTHROSCOPY WITH ANTERIOR CRUCIATE LIGAMENT (ACL) REPAIR Left 01/26/2021  ? Procedure: KNEE ARTHROSCOPY WITH ANTERIOR CRUCIATE LIGAMENT (ACL) REPAIR;  Surgeon: Hiram Gash, MD;  Location: [place];  Service: Orthopedics;  Laterality: Left;  ? KNEE ARTHROSCOPY WITH MEDIAL COLLATERAL LIGAMENT RECONSTRUCTION Left 01/26/2021  ? Procedure: KNEE ARTHROSCOPY WITH MEDIAL COLLATERAL LIGAMENT RECONSTRUCTION WITH MEDIAL PARTIAL MENISECTOMY;  Surgeon: Hiram Gash, MD;  Location: [place];  Service: Orthopedics;  Laterality: Left;  ? WISDOM TOOTH EXTRACTION    ? ?Patient Active Problem List  ? Diagnosis Date Noted  ? Abnormal laboratory test 04/21/2018  ? Multiple sclerosis ([place]) 10/25/2017  ? Low back pain 12/09/2013  ? Pregnancy induced hypertension   ? Anxiety   ? MS (multiple sclerosis) ([place])   ? Lower extremity numbness 02/11/2012  ? Tobacco abuse 02/11/2012  ? Depression 01/23/2011  ? SVD (spontaneous vaginal delivery) 01/22/2011  ? ? ?REFERRING DIAG: Left allograft ACL reconstruction with tibialis anterior , MCL reconstruction Manipulation  ander anesthesia ,Partial medial meniscectomy1/05/2021 ? ?THERAPY DIAG:  ?No diagnosis found. ? ?PERTINENT HISTORY: Pt is s/p L ACL reconstruction w/ tibialis anterior, MCL reconstruction, partial medial meniscectomy and manipulation under anesthesia ? ?PRECAUTIONS: Surgery 01-26-21 Knee; 0-6wks TDWB; Left Knee 0-90deg with hinged knee brace locked in extension ? ?SUBJECTIVE:  ?Pt  presents to PT with no current reports of pain. She had a cortisone injection into L knee last week, MD notes they may have to do a scope to break up scar tissue. She is ready to begin PT treatment at this time.  ?  ?Pain: ?Are you having pain? No  ?NPRS scale: 0/10  ?Pain location: L Knee ?PAIN TYPE: aching ?Pain description: intermittent  ?Aggravating factors: transfers ?Relieving factors: ice, tylenol ? ? ?OBJECTIVE: All objective information taken at eval unless otherwise noted ?  ?PATIENT SURVEYS:  ?LEFS 50/80 - 04/17/2021 ?  ?LE AROM/PROM: ?  ?A/PROM Left ?02/09/2021 Left  ?02/16/2021 Left  ?02/22/2021 Left  ?02/28/2021 Left ?03/07/2021 Left ?04/17/2021  ?Knee flexion 40 45 47 53 57 60 AAROM  ?Knee extension _0 0  2  ? (Blank rows = not tested) ?  ?LE MMT: ?  ?MMT Right ?02/09/2021 Left ?02/09/2021  ?Hip flexion [facility] DNT  ?Hip abduction [facility] DNT  ?Hip adduction [facility] DNT  ?Knee flexion [facility] DNT  ?Knee extension [facility] DNT  ? (Blank rows = not tested) ?  ?TODAY'S TREATMENT:  ?[place] Adult PT Treatment: DATE:05/01/2021 ?Therapeutic Exercise: ?NuStep lvl 4 UE/LE x 5 min focusing on improving knee flexion  ?Prone quad stretch with strap 2x60" L ?Supine SLR 3x10 with quad set L ?Supine heel slide with strap 2x10 - 5" hold ?Seated L hamstring isometric 2x10 - 5" hold ?Supine hamstring stretch with strap 3x60" ?Amb with new brace setting 0-60 deg x 226f ?Standing mini squat 3x10 with UE support ? ?[facility]: DATE:04/26/2021 ?Therapeutic Exercise: ?NuStep lvl 4 UE/LE x 5 min focusing on improving knee flexion  ?Prone quad  stretch with strap 2x60" L ?Supine SLR 3x10 with quad set L ?Supine heel slide with strap 2x10 - 5" hold ?Seated L hamstring isometric 2x10 - 5" hold ?Supine hamstring stretch with strap 3x60" ?Amb with new brace setting 0-60 deg x 221f ?Standing mini squat 3x10 with UE support ? ?[facility]: DATE:04/17/2021 ?Therapeutic Exercise: ?Prone quad stretch with strap 3x30" L ?Supine SLR 3x10 with  quad set L ?Supine heel slide with strap x 10 - 5" hold ?L heel slide on wall x 4 min ?Supine hamstring stretch with strap 3x60" ?Therapeutic Activity: ?Assessments of tests/measures, outcomes, and goals for purposes of re-evaluation ? ?FUNCTIONAL TESTS: ? 30 Second Sit to Stand: 10 reps with UE support - 3/27/20223 ? SLS: unable ? ?PATIENT EDUCATION:  ?Education details: updated HEP and POC ?Person educated: Patient ?Education method: Explanation, Demonstration, and Handouts ?Education comprehension: verbalized understanding, returned demonstration, and needs further education ?  ?  ?HOME EXERCISE PROGRAM: ?Access Code: [email or web address]?URL: [email or web address] ?Date: 04/26/2021 ?Prepared by: DOctavio Manns? ?Exercises ?- Active Straight Leg Raise with Quad Set  - 2-3 x daily - 7 x weekly - 3 sets - 10 reps ?- Supine Heel Slide with Strap  - 2-3 x daily - 7 x weekly - 3 sets - 10 reps - 5 sec hold ?- Prone Quadriceps Stretch with Strap  - 2-3 x daily - 7 x weekly - 3 reps - 30 sec hold ?- Supine Knee Flexion Wall Slide  - 2-3 x daily - 7 x weekly - 3 min hold ?- Supine Hamstring Stretch with Strap  - 2-3 x daily - 7 x weekly - 3 sets - 3 reps - 30-60 hold ?- Mini Squat with Counter Support  - 2 x daily - 7 x weekly - 3 sets - 10 reps ? ?  ?ASSESSMENT: ?*** ? ?Problem List: ?Abnormal gait, decreased activity tolerance, decreased balance, decreased endurance, decreased mobility, difficulty walking, decreased ROM, decreased strength, and pain ?  ?  ?GOALS: ?Goals reviewed with patient? No ?  ?SHORT TERM GOALS: ?  ?STG Name Target Date Goal status  ?1 Pt will be compliant with initial HEP for improved comfort and function ?Baseline: initial HEP given 03/02/2021 MET  ?2 Pt will self report L knee pain no greater than 3/10 for improved comfort and functional ability  ?Baseline: 7/10 at worst 03/02/2021 MET  ?3 Pt will improve L knee AROM to range of 0-90 degrees for improved functional mobility and  progression ?Baseline: 6-40 degrees 03/02/2021 ONGOING  ?4 Pt will be able to perform SLR with no L knee quad lag for improved strength and progression in protocol ?Baseline: unable 03/02/2021 MET  ?  ?LONG TERM GOALS:  ?  ?LTG Name Target Date Goal status  ?1 Pt will improve LEFS to no less than 55/80 as proxy for functional improvement ?Baseline: 16/80 ?04/17/2021: 50/80 06/26/2021 ONGOING  ?2 Pt will self report L LE pain no greater than 1/10 for improved comfort and functional ability  ?Baseline: 7/10 at worst 06/26/2021 PARTIALLY ?MET  ?3 Pt will improve L knee flexion to no less than 120 degrees for improved comfort and functional mobility ?Baseline: 40 degrees ?04/17/2021: 60 degrees on wall 06/26/2021 ONGOING  ?4 Pt will be able to stand in SLS on L LE for at least 30" for improved balance and stability ?Baseline: unable ?04/17/2021: unable 06/26/2021 ONGOING  ?5 Pt will perform at least 10 reps in 30 Second Sit to Stand test for improved balance  and mobility ?Baseline: unable to perform ?04/17/2021: 10 reps with UE support 06/26/2021 PARTIALLY MET  ?  ?PLAN: ?PT FREQUENCY: 2x/week ?  ?PT DURATION: 10 weeks ?  ?PLANNED INTERVENTIONS: Therapeutic exercises, Therapeutic activity, Neuro Muscular re-education, Balance training, Gait training, Patient/Family education, Joint mobilization, Dry Needling, Electrical stimulation, Cryotherapy, Moist heat, Vasopneumatic device, and Manual therapy ?  ?PLAN FOR NEXT SESSION: assess response to HEP, PROM for L knee flexion, progress per protocol as able ?  ?Ward Chatters, PT ?05/01/21 7:41 AM ? ? ?   ?

## 2021-05-03 ENCOUNTER — Ambulatory Visit: Payer: 59

## 2021-05-03 ENCOUNTER — Telehealth: Payer: Self-pay

## 2021-05-03 NOTE — Therapy (Incomplete)
?OUTPATIENT PHYSICAL THERAPY TREATMENT NOTE/RE-EVALUATION ? ? ?Patient Name: Patricia Jensen ?MRN: [id number] ?DOB:06-Sep-1980, 41 y.o., female ?Today's Date: 05/03/2021 ? ?PCP: Concepcion Living, MD ?REFERRING PROVIDER: Hiram Gash, MD ? ? ? ? ? ? ? ?Past Medical History:  ?Diagnosis Date  ? Anxiety   ? Depression   ? Keratoconus   ? MS (multiple sclerosis) ([place])   ? Pregnancy induced hypertension   ? ?Past Surgical History:  ?Procedure Laterality Date  ? KNEE ARTHROSCOPY WITH ANTERIOR CRUCIATE LIGAMENT (ACL) REPAIR Left 01/26/2021  ? Procedure: KNEE ARTHROSCOPY WITH ANTERIOR CRUCIATE LIGAMENT (ACL) REPAIR;  Surgeon: Hiram Gash, MD;  Location: [place];  Service: Orthopedics;  Laterality: Left;  ? KNEE ARTHROSCOPY WITH MEDIAL COLLATERAL LIGAMENT RECONSTRUCTION Left 01/26/2021  ? Procedure: KNEE ARTHROSCOPY WITH MEDIAL COLLATERAL LIGAMENT RECONSTRUCTION WITH MEDIAL PARTIAL MENISECTOMY;  Surgeon: Hiram Gash, MD;  Location: [place];  Service: Orthopedics;  Laterality: Left;  ? WISDOM TOOTH EXTRACTION    ? ?Patient Active Problem List  ? Diagnosis Date Noted  ? Abnormal laboratory test 04/21/2018  ? Multiple sclerosis ([place]) 10/25/2017  ? Low back pain 12/09/2013  ? Pregnancy induced hypertension   ? Anxiety   ? MS (multiple sclerosis) ([place])   ? Lower extremity numbness 02/11/2012  ? Tobacco abuse 02/11/2012  ? Depression 01/23/2011  ? SVD (spontaneous vaginal delivery) 01/22/2011  ? ? ?REFERRING DIAG: Left allograft ACL reconstruction with tibialis anterior , MCL reconstruction Manipulation  ander anesthesia ,Partial medial meniscectomy1/05/2021 ? ?THERAPY DIAG:  ?No diagnosis found. ? ?PERTINENT HISTORY: Pt is s/p L ACL reconstruction w/ tibialis anterior, MCL reconstruction, partial medial meniscectomy and manipulation under anesthesia ? ?PRECAUTIONS: Surgery 01-26-21 Knee; 0-6wks TDWB; Left Knee 0-90deg with hinged knee brace locked in extension ? ?SUBJECTIVE:  ?***  ?   ?Pain: ?Are you having pain? No  ?NPRS scale: 0/10  ?Pain location: L Knee ?PAIN TYPE: aching ?Pain description: intermittent  ?Aggravating factors: transfers ?Relieving factors: ice, tylenol ? ? ?OBJECTIVE: All objective information taken at eval unless otherwise noted ?  ?PATIENT SURVEYS:  ?LEFS 50/80 - 04/17/2021 ?  ?LE AROM/PROM: ?  ?A/PROM Left ?02/09/2021 Left  ?02/16/2021 Left  ?02/22/2021 Left  ?02/28/2021 Left ?03/07/2021 Left ?04/17/2021  ?Knee flexion 40 45 47 53 57 60 AAROM  ?Knee extension _0 0  2  ? (Blank rows = not tested) ?  ?LE MMT: ?  ?MMT Right ?02/09/2021 Left ?02/09/2021  ?Hip flexion [facility] DNT  ?Hip abduction [facility] DNT  ?Hip adduction [facility] DNT  ?Knee flexion [facility] DNT  ?Knee extension [facility] DNT  ? (Blank rows = not tested) ?  ?TODAY'S TREATMENT:  ?[place] Adult PT Treatment: DATE:05/03/2021 ?Therapeutic Exercise: ?NuStep lvl 4 UE/LE x 5 min focusing on improving knee flexion  ?Prone quad stretch with strap 2x60" L ?Supine SLR 3x10 with quad set L ?Supine heel slide with strap 2x10 - 5" hold ?Seated L hamstring isometric 2x10 - 5" hold ?Supine hamstring stretch with strap 3x60" ?Amb with new brace setting 0-60 deg x 234f ?Standing mini squat 3x10 with UE support ? ?[facility]: DATE:04/26/2021 ?Therapeutic Exercise: ?NuStep lvl 4 UE/LE x 5 min focusing on improving knee flexion  ?Prone quad stretch with strap 2x60" L ?Supine SLR 3x10 with quad set L ?Supine heel slide with strap 2x10 - 5" hold ?Seated L hamstring isometric 2x10 - 5" hold ?Supine hamstring stretch with strap 3x60" ?Amb with new brace setting 0-60 deg x  256f ?Standing mini squat 3x10 with UE support ? ?[facility]: DATE:04/17/2021 ?Therapeutic Exercise: ?Prone quad stretch with strap 3x30" L ?Supine SLR 3x10 with quad set L ?Supine heel slide with strap x 10 - 5" hold ?L heel slide on wall x 4 min ?Supine hamstring stretch with strap 3x60" ?Therapeutic Activity: ?Assessments of tests/measures, outcomes, and goals for purposes  of re-evaluation ? ?FUNCTIONAL TESTS: ? 30 Second Sit to Stand: 10 reps with UE support - 3/27/20223 ? SLS: unable ? ?PATIENT EDUCATION:  ?Education details: updated HEP and POC ?Person educated: Patient ?Education method: Explanation, Demonstration, and Handouts ?Education comprehension: verbalized understanding, returned demonstration, and needs further education ?  ?  ?HOME EXERCISE PROGRAM: ?Access Code: [email or web address]?URL: [email or web address] ?Date: 04/26/2021 ?Prepared by: DOctavio Manns? ?Exercises ?- Active Straight Leg Raise with Quad Set  - 2-3 x daily - 7 x weekly - 3 sets - 10 reps ?- Supine Heel Slide with Strap  - 2-3 x daily - 7 x weekly - 3 sets - 10 reps - 5 sec hold ?- Prone Quadriceps Stretch with Strap  - 2-3 x daily - 7 x weekly - 3 reps - 30 sec hold ?- Supine Knee Flexion Wall Slide  - 2-3 x daily - 7 x weekly - 3 min hold ?- Supine Hamstring Stretch with Strap  - 2-3 x daily - 7 x weekly - 3 sets - 3 reps - 30-60 hold ?- Mini Squat with Counter Support  - 2 x daily - 7 x weekly - 3 sets - 10 reps ? ?  ?ASSESSMENT: ?*** ? ?Problem List: ?Abnormal gait, decreased activity tolerance, decreased balance, decreased endurance, decreased mobility, difficulty walking, decreased ROM, decreased strength, and pain ?  ?  ?GOALS: ?Goals reviewed with patient? No ?  ?SHORT TERM GOALS: ?  ?STG Name Target Date Goal status  ?1 Pt will be compliant with initial HEP for improved comfort and function ?Baseline: initial HEP given 03/02/2021 MET  ?2 Pt will self report L knee pain no greater than 3/10 for improved comfort and functional ability  ?Baseline: 7/10 at worst 03/02/2021 MET  ?3 Pt will improve L knee AROM to range of 0-90 degrees for improved functional mobility and progression ?Baseline: 6-40 degrees 03/02/2021 ONGOING  ?4 Pt will be able to perform SLR with no L knee quad lag for improved strength and progression in protocol ?Baseline: unable 03/02/2021 MET  ?  ?LONG TERM GOALS:  ?  ?LTG Name Target  Date Goal status  ?1 Pt will improve LEFS to no less than 55/80 as proxy for functional improvement ?Baseline: 16/80 ?04/17/2021: 50/80 06/26/2021 ONGOING  ?2 Pt will self report L LE pain no greater than 1/10 for improved comfort and functional ability  ?Baseline: 7/10 at worst 06/26/2021 PARTIALLY ?MET  ?3 Pt will improve L knee flexion to no less than 120 degrees for improved comfort and functional mobility ?Baseline: 40 degrees ?04/17/2021: 60 degrees on wall 06/26/2021 ONGOING  ?4 Pt will be able to stand in SLS on L LE for at least 30" for improved balance and stability ?Baseline: unable ?04/17/2021: unable 06/26/2021 ONGOING  ?5 Pt will perform at least 10 reps in 30 Second Sit to Stand test for improved balance and mobility ?Baseline: unable to perform ?04/17/2021: 10 reps with UE support 06/26/2021 PARTIALLY MET  ?  ?PLAN: ?PT FREQUENCY: 2x/week ?  ?PT DURATION: 10 weeks ?  ?PLANNED INTERVENTIONS: Therapeutic exercises, Therapeutic activity, Neuro Muscular re-education, Balance training, Gait training,  Patient/Family education, Joint mobilization, Dry Needling, Electrical stimulation, Cryotherapy, Moist heat, Vasopneumatic device, and Manual therapy ?  ?PLAN FOR NEXT SESSION: assess response to HEP, PROM for L knee flexion, progress per protocol as able ?  ?Ward Chatters, PT ?05/03/21 9:07 AM ? ? ?   ?

## 2021-05-03 NOTE — Telephone Encounter (Signed)
PT called and left voicemail regarding no show for today's session.  ? ?Left reminder of attendance policy and of next appointment.  ? ?Eloy End, PT ?05/03/21 5:13 PM ? ?

## 2021-05-08 ENCOUNTER — Telehealth: Payer: Self-pay

## 2021-05-08 ENCOUNTER — Ambulatory Visit: Payer: 59

## 2021-05-08 NOTE — Therapy (Incomplete)
?OUTPATIENT PHYSICAL THERAPY TREATMENT NOTE/RE-EVALUATION ? ? ?Patient Name: Patricia Jensen ?MRN: [id number] ?DOB:April 28, 1980, 41 y.o., female ?Today's Date: 05/08/2021 ? ?PCP: Concepcion Living, MD ?REFERRING PROVIDER: Reinaldo Berber * ? ? ? ? ? ? ? ?Past Medical History:  ?Diagnosis Date  ? Anxiety   ? Depression   ? Keratoconus   ? MS (multiple sclerosis) ([place])   ? Pregnancy induced hypertension   ? ?Past Surgical History:  ?Procedure Laterality Date  ? KNEE ARTHROSCOPY WITH ANTERIOR CRUCIATE LIGAMENT (ACL) REPAIR Left 01/26/2021  ? Procedure: KNEE ARTHROSCOPY WITH ANTERIOR CRUCIATE LIGAMENT (ACL) REPAIR;  Surgeon: Hiram Gash, MD;  Location: [place];  Service: Orthopedics;  Laterality: Left;  ? KNEE ARTHROSCOPY WITH MEDIAL COLLATERAL LIGAMENT RECONSTRUCTION Left 01/26/2021  ? Procedure: KNEE ARTHROSCOPY WITH MEDIAL COLLATERAL LIGAMENT RECONSTRUCTION WITH MEDIAL PARTIAL MENISECTOMY;  Surgeon: Hiram Gash, MD;  Location: [place];  Service: Orthopedics;  Laterality: Left;  ? WISDOM TOOTH EXTRACTION    ? ?Patient Active Problem List  ? Diagnosis Date Noted  ? Abnormal laboratory test 04/21/2018  ? Multiple sclerosis ([place]) 10/25/2017  ? Low back pain 12/09/2013  ? Pregnancy induced hypertension   ? Anxiety   ? MS (multiple sclerosis) ([place])   ? Lower extremity numbness 02/11/2012  ? Tobacco abuse 02/11/2012  ? Depression 01/23/2011  ? SVD (spontaneous vaginal delivery) 01/22/2011  ? ? ?REFERRING DIAG: Left allograft ACL reconstruction with tibialis anterior , MCL reconstruction Manipulation  ander anesthesia ,Partial medial meniscectomy1/05/2021 ? ?THERAPY DIAG:  ?No diagnosis found. ? ?PERTINENT HISTORY: Pt is s/p L ACL reconstruction w/ tibialis anterior, MCL reconstruction, partial medial meniscectomy and manipulation under anesthesia ? ?PRECAUTIONS: Surgery 01-26-21 Knee; 0-6wks TDWB; Left Knee 0-90deg with hinged knee brace locked in extension ? ?SUBJECTIVE:  ?***   ?  ?Pain: ?Are you having pain? No  ?NPRS scale: 0/10  ?Pain location: L Knee ?PAIN TYPE: aching ?Pain description: intermittent  ?Aggravating factors: transfers ?Relieving factors: ice, tylenol ? ? ?OBJECTIVE: All objective information taken at eval unless otherwise noted ?  ?PATIENT SURVEYS:  ?LEFS 50/80 - 04/17/2021 ?  ?LE AROM/PROM: ?  ?A/PROM Left ?02/09/2021 Left  ?02/16/2021 Left  ?02/22/2021 Left  ?02/28/2021 Left ?03/07/2021 Left ?04/17/2021  ?Knee flexion 40 45 47 53 57 60 AAROM  ?Knee extension _0 0  2  ? (Blank rows = not tested) ?  ?LE MMT: ?  ?MMT Right ?02/09/2021 Left ?02/09/2021  ?Hip flexion [facility] DNT  ?Hip abduction [facility] DNT  ?Hip adduction [facility] DNT  ?Knee flexion [facility] DNT  ?Knee extension [facility] DNT  ? (Blank rows = not tested) ?  ?TODAY'S TREATMENT:  ?[place] Adult PT Treatment: DATE:05/08/2021 ?Therapeutic Exercise: ?NuStep lvl 4 UE/LE x 5 min focusing on improving knee flexion  ?Prone quad stretch with strap 2x60" L ?Supine SLR 3x10 with quad set L ?Supine heel slide with strap 2x10 - 5" hold ?Seated L hamstring isometric 2x10 - 5" hold ?Supine hamstring stretch with strap 3x60" ?Amb with new brace setting 0-60 deg x 247f ?Standing mini squat 3x10 with UE support ? ?[facility]: DATE:04/26/2021 ?Therapeutic Exercise: ?NuStep lvl 4 UE/LE x 5 min focusing on improving knee flexion  ?Prone quad stretch with strap 2x60" L ?Supine SLR 3x10 with quad set L ?Supine heel slide with strap 2x10 - 5" hold ?Seated L hamstring isometric 2x10 - 5" hold ?Supine hamstring stretch with strap 3x60" ?Amb with new brace setting 0-60 deg x  212f ?Standing mini squat 3x10 with UE support ? ?[facility]: DATE:04/17/2021 ?Therapeutic Exercise: ?Prone quad stretch with strap 3x30" L ?Supine SLR 3x10 with quad set L ?Supine heel slide with strap x 10 - 5" hold ?L heel slide on wall x 4 min ?Supine hamstring stretch with strap 3x60" ?Therapeutic Activity: ?Assessments of tests/measures, outcomes, and goals for  purposes of re-evaluation ? ?FUNCTIONAL TESTS: ? 30 Second Sit to Stand: 10 reps with UE support - 3/27/20223 ? SLS: unable ? ?PATIENT EDUCATION:  ?Education details: updated HEP and POC ?Person educated: Patient ?Education method: Explanation, Demonstration, and Handouts ?Education comprehension: verbalized understanding, returned demonstration, and needs further education ?  ?  ?HOME EXERCISE PROGRAM: ?Access Code: [email or web address]?URL: [email or web address] ?Date: 04/26/2021 ?Prepared by: DOctavio Manns? ?Exercises ?- Active Straight Leg Raise with Quad Set  - 2-3 x daily - 7 x weekly - 3 sets - 10 reps ?- Supine Heel Slide with Strap  - 2-3 x daily - 7 x weekly - 3 sets - 10 reps - 5 sec hold ?- Prone Quadriceps Stretch with Strap  - 2-3 x daily - 7 x weekly - 3 reps - 30 sec hold ?- Supine Knee Flexion Wall Slide  - 2-3 x daily - 7 x weekly - 3 min hold ?- Supine Hamstring Stretch with Strap  - 2-3 x daily - 7 x weekly - 3 sets - 3 reps - 30-60 hold ?- Mini Squat with Counter Support  - 2 x daily - 7 x weekly - 3 sets - 10 reps ? ?  ?ASSESSMENT: ?*** ? ?Problem List: ?Abnormal gait, decreased activity tolerance, decreased balance, decreased endurance, decreased mobility, difficulty walking, decreased ROM, decreased strength, and pain ?  ?  ?GOALS: ?Goals reviewed with patient? No ?  ?SHORT TERM GOALS: ?  ?STG Name Target Date Goal status  ?1 Pt will be compliant with initial HEP for improved comfort and function ?Baseline: initial HEP given 03/02/2021 MET  ?2 Pt will self report L knee pain no greater than 3/10 for improved comfort and functional ability  ?Baseline: 7/10 at worst 03/02/2021 MET  ?3 Pt will improve L knee AROM to range of 0-90 degrees for improved functional mobility and progression ?Baseline: 6-40 degrees 03/02/2021 ONGOING  ?4 Pt will be able to perform SLR with no L knee quad lag for improved strength and progression in protocol ?Baseline: unable 03/02/2021 MET  ?  ?LONG TERM GOALS:  ?  ?LTG  Name Target Date Goal status  ?1 Pt will improve LEFS to no less than 55/80 as proxy for functional improvement ?Baseline: 16/80 ?04/17/2021: 50/80 06/26/2021 ONGOING  ?2 Pt will self report L LE pain no greater than 1/10 for improved comfort and functional ability  ?Baseline: 7/10 at worst 06/26/2021 PARTIALLY ?MET  ?3 Pt will improve L knee flexion to no less than 120 degrees for improved comfort and functional mobility ?Baseline: 40 degrees ?04/17/2021: 60 degrees on wall 06/26/2021 ONGOING  ?4 Pt will be able to stand in SLS on L LE for at least 30" for improved balance and stability ?Baseline: unable ?04/17/2021: unable 06/26/2021 ONGOING  ?5 Pt will perform at least 10 reps in 30 Second Sit to Stand test for improved balance and mobility ?Baseline: unable to perform ?04/17/2021: 10 reps with UE support 06/26/2021 PARTIALLY MET  ?  ?PLAN: ?PT FREQUENCY: 2x/week ?  ?PT DURATION: 10 weeks ?  ?PLANNED INTERVENTIONS: Therapeutic exercises, Therapeutic activity, Neuro Muscular re-education, Balance training, Gait training,  Patient/Family education, Joint mobilization, Dry Needling, Electrical stimulation, Cryotherapy, Moist heat, Vasopneumatic device, and Manual therapy ?  ?PLAN FOR NEXT SESSION: assess response to HEP, PROM for L knee flexion, progress per protocol as able ?  ?Ward Chatters, PT ?05/08/21 7:45 AM ? ? ?   ?

## 2021-05-08 NOTE — Telephone Encounter (Signed)
PT called and left voicemail regarding missed appointment. ? ?As this is her second straight no show and PT was unable to get patient on phone, PT will have to cancel future visits. ? ?If she calls back to schedule we will schedule her one visit at a time due to attendance policy. ? ?Eloy End, PT ?05/08/21 11:29 AM ? ?

## 2021-05-10 ENCOUNTER — Ambulatory Visit: Payer: 59

## 2021-05-15 ENCOUNTER — Telehealth: Payer: 59

## 2021-05-17 ENCOUNTER — Ambulatory Visit: Payer: 59

## 2021-05-18 ENCOUNTER — Ambulatory Visit (HOSPITAL_BASED_OUTPATIENT_CLINIC_OR_DEPARTMENT_OTHER): Payer: 59 | Admitting: Nurse Practitioner

## 2021-05-19 ENCOUNTER — Telehealth: Payer: 59 | Admitting: Emergency Medicine

## 2021-05-19 DIAGNOSIS — L03811 Cellulitis of head [any part, except face]: Secondary | ICD-10-CM

## 2021-05-19 MED ORDER — CEPHALEXIN 500 MG PO CAPS: 500.0000 mg | ORAL_CAPSULE | Freq: Four times a day (QID) | ORAL | 0 refills | Status: AC

## 2021-05-19 NOTE — Progress Notes (Signed)
E Visit for Cellulitis ? ?We are sorry that you are not feeling well. Here is how we plan to help! ? ?Based on what you shared with me it looks like you have cellulitis.  Cellulitis looks like areas of skin redness, swelling, and warmth; it develops as a result of bacteria entering under the skin. Little red spots and/or bleeding can be seen in skin, and tiny surface sacs containing fluid can occur. Fever can be present. Cellulitis is almost always on one side of a body, and the lower limbs are the most common site of involvement.  ? ?I can't be sure based on your pictures alone, but it might be that you had a scalp infection and the "bumps in a row" are lymph nodes trying to drain the infection. Your lymphatic system will try to drain infection or swelling in your body and the lymph nodes in your neck are close to the skin surface and easiest to feel when they are a little enlarged.  ? ?I have prescribed:  Keflex 500mg  take one by mouth four times a day for 5 days ? ?HOME CARE: ? ?Take your medications as ordered and take all of them, even if the skin irritation appears to be healing.  ? ?GET HELP RIGHT AWAY IF: ? ?Symptoms that don't begin to go away within 48 hours. ?Severe redness persists or worsens ?If the area turns color, spreads or swells. ?If it blisters and opens, develops yellow-brown crust or bleeds. ?You develop a fever or chills. ?If the pain increases or becomes unbearable.  ?Are unable to keep fluids and food down. ? ?MAKE SURE YOU  ? ?Understand these instructions. ?Will watch your condition. ?Will get help right away if you are not doing well or get worse. ? ?Thank you for choosing an e-visit. ? ?Your e-visit answers were reviewed by a board certified advanced clinical practitioner to complete your personal care plan. Depending upon the condition, your plan could have included both over the counter or prescription medications. ? ?Please review your pharmacy choice. Make sure the pharmacy is open  so you can pick up prescription now. If there is a problem, you may contact your provider through [employer] and have the prescription routed to another pharmacy.  Your safety is important to [place]. If you have drug allergies check your prescription carefully.  ? ?For the next 24 hours you can use MyChart to ask questions about today's visit, request a non-urgent call back, or ask for a work or school excuse. ?You will get an email in the next two days asking about your experience. I hope that your e-visit has been valuable and will speed your recovery. ? ?I have spent 5 minutes in review of e-visit questionnaire, review and updating patient chart, medical decision making and response to patient.  ? ?Willeen Cass, PhD, FNP-BC ?  ?

## 2021-05-22 DIAGNOSIS — Z419 Encounter for procedure for purposes other than remedying health state, unspecified: Secondary | ICD-10-CM | POA: Diagnosis not present

## 2021-05-24 ENCOUNTER — Encounter (HOSPITAL_BASED_OUTPATIENT_CLINIC_OR_DEPARTMENT_OTHER): Payer: Self-pay | Admitting: Nurse Practitioner

## 2021-05-24 ENCOUNTER — Ambulatory Visit (INDEPENDENT_AMBULATORY_CARE_PROVIDER_SITE_OTHER): Payer: 59 | Admitting: Nurse Practitioner

## 2021-05-24 VITALS — BP 122/82 | HR 99 | Ht 65.0 in | Wt 225.0 lb

## 2021-05-24 DIAGNOSIS — G35D Multiple sclerosis, unspecified: Secondary | ICD-10-CM

## 2021-05-24 DIAGNOSIS — K047 Periapical abscess without sinus: Secondary | ICD-10-CM | POA: Diagnosis not present

## 2021-05-24 DIAGNOSIS — G35 Multiple sclerosis: Secondary | ICD-10-CM

## 2021-05-24 DIAGNOSIS — N907 Vulvar cyst: Secondary | ICD-10-CM

## 2021-05-24 DIAGNOSIS — R599 Enlarged lymph nodes, unspecified: Secondary | ICD-10-CM

## 2021-05-24 DIAGNOSIS — M7989 Other specified soft tissue disorders: Secondary | ICD-10-CM

## 2021-05-24 DIAGNOSIS — Z7689 Persons encountering health services in other specified circumstances: Secondary | ICD-10-CM | POA: Diagnosis not present

## 2021-05-24 MED ORDER — AMOXICILLIN 875 MG PO TABS: 875.0000 mg | ORAL_TABLET | Freq: Two times a day (BID) | ORAL | 0 refills | Status: AC

## 2021-05-24 MED ORDER — METRONIDAZOLE 500 MG PO TABS: 500.0000 mg | ORAL_TABLET | Freq: Two times a day (BID) | ORAL | 0 refills | Status: DC

## 2021-05-24 NOTE — Progress Notes (Signed)
?Orma Render, DNP, AGNP-c ?Primary Care & Sports Medicine ?[place], [place] [address] ?[phone] ? ?New patient visit ? ? ?Patient: Patricia Jensen   DOB: 1980/03/17   41 y.o. Female  MRN: [id number] ?Visit Date: 05/24/2021 ? ?Patient Care Team: ?Makaylen Thieme, Coralee Pesa, NP as PCP - General (Nurse Practitioner) ? ?Today's Vitals  ? 05/24/21 1038  ?BP: 122/82  ?Pulse: 99  ?SpO2: 99%  ?Weight: 225 lb (102.1 kg)  ?Height: 5\' 5"  (1.651 m)  ? ?Body mass index is 37.44 kg/m?.  ? ?Today's healthcare provider: Orma Render, NP  ? ?No chief complaint on file. ? ?Subjective  ?  ?Patricia Jensen is a 41 y.o. female who presents today as a new patient to establish care.  ?  ?Patient endorses the following concerns presently: ? ?L wrist ?- Nodularity to the lateral aspect of the left wrist ?- present for a long time, but became larger recently ?- firm, non-mobile, non-tender ?- previous injury to wrist ?- some pain with flexion and internal flexion into thumb ? ?L neck/occipital ?- mass noted by patient over the last week or so on lateral L side of neck ?- no pain ?- does not appear to be changing ?- recent infection of tooth ?- no ear pain, jaw, pain, numbness ? ?L labia ?- hx of cyst on labia ?- recently developed new cyst and not sure if it the same ?- drains at times ?- can be tender ?- no vaginal discharge, odor, hx of STI, new partner exposure ? ?MS ?- chronic.  ? ?History reviewed and reveals the following: ?Past Medical History:  ?Diagnosis Date  ? Anxiety   ? Depression   ? Keratoconus   ? MS (multiple sclerosis) ([place])   ? Pregnancy induced hypertension   ? ?Past Surgical History:  ?Procedure Laterality Date  ? KNEE ARTHROSCOPY WITH ANTERIOR CRUCIATE LIGAMENT (ACL) REPAIR Left 01/26/2021  ? Procedure: KNEE ARTHROSCOPY WITH ANTERIOR CRUCIATE LIGAMENT (ACL) REPAIR;  Surgeon: Hiram Gash, MD;  Location: [facility];  Service: Orthopedics;  Laterality: Left;  ? KNEE  ARTHROSCOPY WITH MEDIAL COLLATERAL LIGAMENT RECONSTRUCTION Left 01/26/2021  ? Procedure: KNEE ARTHROSCOPY WITH MEDIAL COLLATERAL LIGAMENT RECONSTRUCTION WITH MEDIAL PARTIAL MENISECTOMY;  Surgeon: Hiram Gash, MD;  Location: [facility];  Service: Orthopedics;  Laterality: Left;  ? WISDOM TOOTH EXTRACTION    ? ?Family Status  ?Relation Name Status  ? Mother Colin Ina Alive  ? Father Naviana Pandit Alive  ? Mat Aunt  (Not Specified)  ? Neg Hx  (Not Specified)  ? ?Family History  ?Problem Relation Age of Onset  ? Diabetes Mellitus II Maternal Aunt   ? Anesthesia problems Neg Hx   ? Hypotension Neg Hx   ? Malignant hyperthermia Neg Hx   ? ?Social History  ? ?Socioeconomic History  ? Marital status: Married  ?  Spouse name: Lowella Dandy  ? Number of children: 3  ? Years of education: 66  ? Highest education level: Not on file  ?Occupational History  ?  Comment: Home maker  ? Occupation: Unemployed  ?Tobacco Use  ? Smoking status: Every Day  ?  Packs/day: 0.25  ?  Years: 8.00  ?  Pack years: 2.00  ?  Types: Cigarettes  ? Smokeless tobacco: Never  ?Vaping Use  ? Vaping Use: Never used  ?Substance and Sexual Activity  ? Alcohol use: Yes  ?  Comment: OCC  ? Drug  use: No  ? Sexual activity: Yes  ?Other Topics Concern  ? Not on file  ?Social History Narrative  ? Patient lives at home with with her husband (Patricia Jensen)  and children. Patient has a high school education.  ? Right handed.  ? Caffeine- one cup daily.  ? ?Social Determinants of Health  ? ?Financial Resource Strain: Not on file  ?Food Insecurity: Not on file  ?Transportation Needs: Not on file  ?Physical Activity: Not on file  ?Stress: Not on file  ?Social Connections: Not on file  ? ?Outpatient Medications Prior to Visit  ?Medication Sig  ? ALPRAZolam (XANAX XR) 1 MG 24 hr tablet Take 1 mg by mouth 2 (two) times daily.   ? ALPRAZolam (XANAX) 1 MG tablet Take 1-2 mg by mouth 2 (two) times daily as needed for anxiety or sleep.   ? [EXPIRED]  cephALEXin (KEFLEX) 500 MG capsule Take 1 capsule (500 mg total) by mouth 4 (four) times daily for 5 days.  ? citalopram (CELEXA) 40 MG tablet Take 40 mg by mouth at bedtime.   ? lisdexamfetamine (VYVANSE) 40 MG capsule Take 40 mg by mouth every morning.  ? carboxymethylcellulose (REFRESH PLUS) 0.5 % SOLN Place 2 drops into both eyes daily as needed (for contacts).  ? celecoxib (CELEBREX) 100 MG capsule Take 100 mg by mouth 2 (two) times daily.  ? diclofenac (VOLTAREN) 75 MG EC tablet Take 1 tablet (75 mg total) by mouth 2 (two) times daily.  ? lamoTRIgine (LAMICTAL) 25 MG tablet   ? [DISCONTINUED] Cholecalciferol (VITAMIN D PO) Take 3,000 Units by mouth daily.  ? ?No facility-administered medications prior to visit.  ? ?No Known Allergies ?Immunization History  ?Administered Date(s) Administered  ? Influenza,inj,Quad PF,6+ Mos 10/15/2014  ? Pneumococcal Polysaccharide-23 06/10/2014  ? Tdap 01/22/2011, 10/29/2014  ? ? ?Review of Systems ?All review of systems negative except what is listed in the HPI ? ? Objective  ?  ?BP 122/82   Pulse 99   Ht 5\' 5"  (1.651 m)   Wt 225 lb (102.1 kg)   SpO2 99%   BMI 37.44 kg/m?  ?Physical Exam ?Vitals and nursing note reviewed.  ?Constitutional:   ?   General: She is not in acute distress. ?   Appearance: Normal appearance.  ?Eyes:  ?   Extraocular Movements: Extraocular movements intact.  ?   Conjunctiva/sclera: Conjunctivae normal.  ?   Pupils: Pupils are equal, round, and reactive to light.  ?Neck:  ?   Vascular: No carotid bruit.  ?Cardiovascular:  ?   Rate and Rhythm: Normal rate and regular rhythm.  ?   Pulses: Normal pulses.  ?   Heart sounds: Normal heart sounds. No murmur heard. ?Pulmonary:  ?   Effort: Pulmonary effort is normal.  ?   Breath sounds: Normal breath sounds. No wheezing.  ?Abdominal:  ?   General: Bowel sounds are normal.  ?   Palpations: Abdomen is soft.  ?Genitourinary: ?   Comments: Deferred- menses ?Musculoskeletal:     ?   General: Normal range of  motion.  ?   Cervical back: Normal range of motion.  ?   Right lower leg: No edema.  ?   Left lower leg: No edema.  ?Lymphadenopathy:  ?   Cervical: Cervical adenopathy present.  ?Skin: ?   General: Skin is warm and dry.  ?   Capillary Refill: Capillary refill takes less than 2 seconds.  ?   Comments: Appx 2-3cm nodule to  the left wrist on the lateral side adjacent to the thumb. Form, non-mobile, non-tender.   ?Neurological:  ?   General: No focal deficit present.  ?   Mental Status: She is alert and oriented to person, place, and time.  ?Psychiatric:     ?   Mood and Affect: Mood normal.     ?   Behavior: Behavior normal.     ?   Thought Content: Thought content normal.     ?   Judgment: Judgment normal.  ? ? ?No results found for any visits on 05/24/21. ? Assessment & Plan   ?  ? ?Problem List Items Addressed This Visit   ? ? MS (multiple sclerosis) ([place])  ?  Chronic. Well controlled at this time. No chronic tx. Will follow  ? ?  ?  ? Relapsing remitting multiple sclerosis ([place])  ? Dental abscess  ?  Symptoms and presentation consistent with dental infection. Erythema, mild edema, and tenderness to molar on right side. No exudate present. No fevers or alarm sx. Suspect this is causing lymph node enlargement in the neck. Will send tx today with antibiotics. Recommend speedy dental evaluation to prevent recurrence or worsening of infectious process.  ? ? ?  ?  ? Relevant Medications  ? metroNIDAZOLE (FLAGYL) 500 MG tablet  ? Enlarged lymph nodes  ?  Cervical lymph node enlargement of the left side correlating with suspected dental infection. Will send treatment for dental infection. Will order [place] of neck to rule out abnormality. Encourage warm compresses and f/u with dentist in next few days for tooth evaluation. F/U if sx worsen or fail to improve.  ? ?  ?  ? Relevant Orders  ? US Soft Tissue Head/Neck (NON-THYROID) (Completed)  ? Labial cyst  ?  Recurrent labial cyst. Unable to view today. Will have patient return  for evaluation and recommendations. No alarm sx present. Will likely need drainage based on patient descriptors.  ? ?  ?  ? Nodule of soft tissue  ?  Wrist nodularity. Suspect this is related to the tendon sheath on t

## 2021-05-24 NOTE — Patient Instructions (Addendum)
Thank you for choosing [facility] for your Primary Care needs. I am excited for the opportunity to partner with you to meet your health care goals. It was a pleasure meeting you today! ? ?Recommendations from today's visit: ?I am going to send in antibiotic treatment for the place in your mouth to see if we can get this cleared up. I do believe that the infection is causing the lymph nodes to get larger, but I also want to have the ultrasound of the lymph nodes to make sure they are OK looking.  I am sending in two different types of antibiotics that should help clear this up.  ?I believe the place on your wrist is a cyst that may be attached to the tendon. I would ask Dr Griffin Basil if this is something that he is able to remove or if he could recommend someone to remove it.  ?We will get labs today to make sure everything looks ok. We will be in touch with you to let you know if there are any concerning findings once I have reviewed the results.  ?We will plan your pap smear and evaluation of the cyst on your labia ? ?Information on diet, exercise, and health maintenance recommendations are listed below. This is information to help you be sure you are on track for optimal health and monitoring.  ? ?Please look over this and let us know if you have any questions or if you have completed any of the health maintenance outside of [place] so that we can be sure your records are up to date.  ?___________________________________________________________ ?About Me: ?I am an Adult-Geriatric Nurse Practitioner with a background in caring for patients for more than 20 years with a strong intensive care background. I provide primary care and sports medicine services to patients age 25 and older within this office. My education had a strong focus on caring for the older adult population, which I am passionate about. I am also the director of the APP Fellowship with [facility].  ? ?My desire  is to provide you with the best service through preventive medicine and supportive care. I consider you a part of the medical team and value your input. I work diligently to ensure that you are heard and your needs are met in a safe and effective manner. I want you to feel comfortable with me as your provider and want you to know that your health concerns are important to me. ? ?For your information, our office hours are: ?Monday, Tuesday, and Thursday 8:00 AM - 5:00 PM ?Wednesday and Friday 8:00 AM - 12:00 PM.  ? ?In my time away from the office I am teaching new APP's within the system and am unavailable, but my partner, Dr. Burnard Bunting is in the office for emergent needs.  ? ?If you have questions or concerns, please call our office at [phone] or send [place] a MyChart message and we will respond as quickly as possible.  ?____________________________________________________________ ?MyChart:  ?For all urgent or time sensitive needs we ask that you please call the office to avoid delays. Our number is [phone]. ?MyChart is not constantly monitored and due to the large volume of messages a day, replies may take up to 72 business hours. ? ?MyChart Policy: ?MyChart allows for you to see your visit notes, after visit summary, provider recommendations, lab and tests results, make an appointment, request refills, and contact your provider or the office for non-urgent questions or  concerns. Providers are seeing patients during normal business hours and do not have built in time to review MyChart messages.  ?We ask that you allow a minimum of 3 business days for responses to MyChart messages. For this reason, please do not send urgent requests through MyChart. Please call the office at [phone]. ?New and ongoing conditions may require a visit. We have virtual and in person visit available for your convenience.  ?Complex MyChart concerns may require a visit. Your provider may request you schedule a virtual or in  person visit to ensure we are providing the best care possible. ?MyChart messages sent after 11:00 AM on Friday will not be received by the provider until Monday morning.  ?  ?Lab and Test Results: ?You will receive your lab and test results on MyChart as soon as they are completed and results have been sent by the lab or testing facility. Due to this service, you will receive your results BEFORE your provider.  ?I review lab and tests results each morning prior to seeing patients. Some results require collaboration with other providers to ensure you are receiving the most appropriate care. For this reason, we ask that you please allow a minimum of 3-5 business days from the time the ALL results have been received for your provider to receive and review lab and test results and contact you about these.  ?Most lab and test result comments from the provider will be sent through MyChart. Your provider may recommend changes to the plan of care, follow-up visits, repeat testing, ask questions, or request an office visit to discuss these results. You may reply directly to this message or call the office at [phone] to provide information for the provider or set up an appointment. ?In some instances, you will be called with test results and recommendations. Please let us know if this is preferred and we will make note of this in your chart to provide this for you.    ?If you have not heard a response to your lab or test results in 5 business days from all results returning to MyChart, please call the office to let us know. We ask that you please avoid calling prior to this time unless there is an emergent concern. Due to high call volumes, this can delay the resulting process. ? ?After Hours: ?For all non-emergency after hours needs, please call the office at [phone] and select the option to reach the on-call provider service. On-call services are shared between multiple [place] offices and therefore it will  not be possible to speak directly with your provider. On-call providers may provide medical advice and recommendations, but are unable to provide refills for maintenance medications.  ?For all emergency or urgent medical needs after normal business hours, we recommend that you seek care at the closest Urgent Care or Emergency Department to ensure appropriate treatment in a timely manner.  ?[facility] has a 24 hour emergency room located on the ground floor for your convenience.  ? ?Urgent Concerns During the Business Day ?Providers are seeing patients from 8AM to 5PM with a busy schedule and are most often not able to respond to non-urgent calls until the end of the day or the next business day. ?If you should have URGENT concerns during the day, please call and speak to the nurse or schedule a same day appointment so that we can address your concern without delay.  ? ?Thank you, again, for choosing me as your health care   partner. I appreciate your trust and look forward to learning more about you.  ? ?SaraBeth Early, DNP, AGNP-c ?___________________________________________________________ ? ?Health Maintenance Recommendations ?Screening Testing ?Mammogram ?Every 1 -2 years based on history and risk factors ?Starting at age 40 ?Pap Smear ?Ages 21-39 every 3 years ?Ages 30-65 every 5 years with HPV testing ?More frequent testing may be required based on results and history ?Colon Cancer Screening ?Every 1-10 years based on test performed, risk factors, and history ?Starting at age 45 ?Bone Density Screening ?Every 2-10 years based on history ?Starting at age 65 for women ?Recommendations for men differ based on medication usage, history, and risk factors ?AAA Screening ?One time ultrasound ?Men 65-75 years old who have every smoked ?Lung Cancer Screening ?Low Dose Lung CT every 12 months ?Age 55-80 years with a 30 pack-year smoking history who still smoke or who have quit within the last 15  years ? ?Screening Labs ?Routine  Labs: Complete Blood Count (CBC), Complete Metabolic Panel (CMP), Cholesterol (Lipid Panel) ?Every 6-12 months based on history and medications ?May be recommended more freque

## 2021-05-26 ENCOUNTER — Encounter (HOSPITAL_BASED_OUTPATIENT_CLINIC_OR_DEPARTMENT_OTHER): Payer: Self-pay

## 2021-05-30 ENCOUNTER — Ambulatory Visit
Admission: RE | Admit: 2021-05-30 | Discharge: 2021-05-30 | Disposition: A | Discharge status: Home / Self Care | Payer: Medicaid Other | Source: Ambulatory Visit | Attending: Nurse Practitioner | Admitting: Nurse Practitioner

## 2021-05-30 ENCOUNTER — Ambulatory Visit (HOSPITAL_BASED_OUTPATIENT_CLINIC_OR_DEPARTMENT_OTHER): Payer: 59 | Admitting: Nurse Practitioner

## 2021-05-30 DIAGNOSIS — M25532 Pain in left wrist: Secondary | ICD-10-CM | POA: Diagnosis not present

## 2021-05-30 DIAGNOSIS — R590 Localized enlarged lymph nodes: Secondary | ICD-10-CM | POA: Diagnosis not present

## 2021-05-30 DIAGNOSIS — R599 Enlarged lymph nodes, unspecified: Secondary | ICD-10-CM

## 2021-06-01 DIAGNOSIS — R599 Enlarged lymph nodes, unspecified: Secondary | ICD-10-CM | POA: Insufficient documentation

## 2021-06-01 DIAGNOSIS — M7989 Other specified soft tissue disorders: Secondary | ICD-10-CM | POA: Insufficient documentation

## 2021-06-01 DIAGNOSIS — K047 Periapical abscess without sinus: Secondary | ICD-10-CM | POA: Insufficient documentation

## 2021-06-01 DIAGNOSIS — N907 Vulvar cyst: Secondary | ICD-10-CM | POA: Insufficient documentation

## 2021-06-01 NOTE — Assessment & Plan Note (Signed)
Symptoms and presentation consistent with dental infection. Erythema, mild edema, and tenderness to molar on right side. No exudate present. No fevers or alarm sx. Suspect this is causing lymph node enlargement in the neck. Will send tx today with antibiotics. Recommend speedy dental evaluation to prevent recurrence or worsening of infectious process.  ? ?

## 2021-06-01 NOTE — Assessment & Plan Note (Signed)
Chronic. Well controlled at this time. No chronic tx. Will follow  ?

## 2021-06-01 NOTE — Assessment & Plan Note (Signed)
Cervical lymph node enlargement of the left side correlating with suspected dental infection. Will send treatment for dental infection. Will order [place] of neck to rule out abnormality. Encourage warm compresses and f/u with dentist in next few days for tooth evaluation. F/U if sx worsen or fail to improve.  ?

## 2021-06-01 NOTE — Assessment & Plan Note (Signed)
Wrist nodularity. Suspect this is related to the tendon sheath on the wrist. Firm, non-mobile present. Recommend discussion with Dr. Griffin Basil with ortho for complete evaluation and recommendations.  ?

## 2021-06-01 NOTE — Assessment & Plan Note (Signed)
Recurrent labial cyst. Unable to view today. Will have patient return for evaluation and recommendations. No alarm sx present. Will likely need drainage based on patient descriptors.  ?

## 2021-06-04 LAB — CBC WITH DIFFERENTIAL/PLATELET
Basophils Absolute: 0 10*3/uL (ref 0.0–0.2)
Basos: 1 %
EOS (ABSOLUTE): 0 10*3/uL (ref 0.0–0.4)
Eos: 0 %
Hematocrit: 42 % (ref 34.0–46.6)
Hemoglobin: 14.4 g/dL (ref 11.1–15.9)
Immature Grans (Abs): 0 10*3/uL (ref 0.0–0.1)
Immature Granulocytes: 1 %
Lymphocytes Absolute: 0.5 10*3/uL — ABNORMAL LOW (ref 0.7–3.1)
Lymphs: 14 %
MCH: 33.3 pg — ABNORMAL HIGH (ref 26.6–33.0)
MCHC: 34.3 g/dL (ref 31.5–35.7)
MCV: 97 fL (ref 79–97)
Monocytes Absolute: 0.4 10*3/uL (ref 0.1–0.9)
Monocytes: 10 %
Neutrophils Absolute: 2.8 10*3/uL (ref 1.4–7.0)
Neutrophils: 74 %
Platelets: 240 10*3/uL (ref 150–450)
RBC: 4.32 x10E6/uL (ref 3.77–5.28)
RDW: 11.9 % (ref 11.7–15.4)
WBC: 3.7 10*3/uL (ref 3.4–10.8)

## 2021-06-04 LAB — VITAMIN D 1,25 DIHYDROXY
Vitamin D 1, 25 (OH)2 Total: 65 pg/mL
Vitamin D2 1, 25 (OH)2: 21 pg/mL
Vitamin D3 1, 25 (OH)2: 44 pg/mL

## 2021-06-04 LAB — TSH: TSH: 1.25 u[IU]/mL (ref 0.450–4.500)

## 2021-06-04 LAB — COMPREHENSIVE METABOLIC PANEL
ALT: 18 IU/L (ref 0–32)
AST: 35 IU/L (ref 0–40)
Albumin/Globulin Ratio: 1.5 (ref 1.2–2.2)
Albumin: 4.3 g/dL (ref 3.8–4.8)
Alkaline Phosphatase: 52 IU/L (ref 44–121)
BUN/Creatinine Ratio: 12 (ref 9–23)
BUN: 9 mg/dL (ref 6–20)
Bilirubin Total: 0.2 mg/dL (ref 0.0–1.2)
CO2: 23 mmol/L (ref 20–29)
Calcium: 9.4 mg/dL (ref 8.7–10.2)
Chloride: 100 mmol/L (ref 96–106)
Creatinine, Ser: 0.77 mg/dL (ref 0.57–1.00)
Globulin, Total: 2.9 g/dL (ref 1.5–4.5)
Glucose: 83 mg/dL (ref 70–99)
Potassium: 4.6 mmol/L (ref 3.5–5.2)
Sodium: 137 mmol/L (ref 134–144)
Total Protein: 7.2 g/dL (ref 6.0–8.5)
eGFR: 101 mL/min/{1.73_m2} (ref >59)

## 2021-06-04 LAB — T4, FREE: Free T4: 0.98 ng/dL (ref 0.82–1.77)

## 2021-06-04 LAB — HEMOGLOBIN A1C
Est. average glucose Bld gHb Est-mCnc: 105 mg/dL
Hgb A1c MFr Bld: 5.3 % (ref 4.8–5.6)

## 2021-06-05 NOTE — H&P (Signed)
? ? ?PREOPERATIVE H&P ? ?Chief Complaint: ANKYLOSIS LEFT KNEE ? ?HPI: ?Patricia Jensen is a 41 y.o. female who is scheduled for, Procedure(s): ?ARTHROSCOPY KNEE, MANIPULATION WITH LYSIS OF ADHESIONS.  ? ?Patient has a past medical history significant for MS.  ? ?Patient had a left knee arthroscopy with ACL allograft reconstruction, MCL reconstruction, medial meniscectomy, and manipulation under anesthesia on 01/26/21. She was at extremely high risk for stiffness postoperatively as she had significant arthrosis at the beginning of the case. She continued to struggle with stiffness post-operatively. She tried injection and only was able to get about 15 more degrees of motion. She has been doing physical therapy and at home exercises. She was not making any progress in regards to motion.  ? ?Symptoms are rated as moderate to severe, and have been worsening.  This is significantly impairing activities of daily living.   ? ?Please see clinic note for further details on this patient's care.   ? ?She has elected for surgical management.  ? ?Past Medical History:  ?Diagnosis Date  ? Anxiety   ? Depression   ? Keratoconus   ? MS (multiple sclerosis) ([place])   ? Pregnancy induced hypertension   ? ?Past Surgical History:  ?Procedure Laterality Date  ? KNEE ARTHROSCOPY WITH ANTERIOR CRUCIATE LIGAMENT (ACL) REPAIR Left 01/26/2021  ? Procedure: KNEE ARTHROSCOPY WITH ANTERIOR CRUCIATE LIGAMENT (ACL) REPAIR;  Surgeon: Hiram Gash, MD;  Location: [facility];  Service: Orthopedics;  Laterality: Left;  ? KNEE ARTHROSCOPY WITH MEDIAL COLLATERAL LIGAMENT RECONSTRUCTION Left 01/26/2021  ? Procedure: KNEE ARTHROSCOPY WITH MEDIAL COLLATERAL LIGAMENT RECONSTRUCTION WITH MEDIAL PARTIAL MENISECTOMY;  Surgeon: Hiram Gash, MD;  Location: [facility];  Service: Orthopedics;  Laterality: Left;  ? WISDOM TOOTH EXTRACTION    ? ?Social History  ? ?Socioeconomic History  ? Marital status: Married  ?  Spouse name: Lowella Dandy  ?  Number of children: 3  ? Years of education: 28  ? Highest education level: Not on file  ?Occupational History  ?  Comment: Home maker  ? Occupation: Unemployed  ?Tobacco Use  ? Smoking status: Every Day  ?  Packs/day: 0.25  ?  Years: 8.00  ?  Pack years: 2.00  ?  Types: Cigarettes  ? Smokeless tobacco: Never  ?Vaping Use  ? Vaping Use: Never used  ?Substance and Sexual Activity  ? Alcohol use: Yes  ?  Comment: OCC  ? Drug use: No  ? Sexual activity: Yes  ?Other Topics Concern  ? Not on file  ?Social History Narrative  ? Patient lives at home with with her husband (sergio Varjas- Eugenio Hoes)  and children. Patient has a high school education.  ? Right handed.  ? Caffeine- one cup daily.  ? ?Social Determinants of Health  ? ?Financial Resource Strain: Not on file  ?Food Insecurity: Not on file  ?Transportation Needs: Not on file  ?Physical Activity: Not on file  ?Stress: Not on file  ?Social Connections: Not on file  ? ?Family History  ?Problem Relation Age of Onset  ? Diabetes Mellitus II Maternal Aunt   ? Anesthesia problems Neg Hx   ? Hypotension Neg Hx   ? Malignant hyperthermia Neg Hx   ? ?No Known Allergies ?Prior to Admission medications   ?Medication Sig Start Date End Date Taking? Authorizing Provider  ?ALPRAZolam (XANAX XR) 1 MG 24 hr tablet Take 1 mg by mouth 2 (two) times daily.     [provider]  ?ALPRAZolam (  XANAX) 1 MG tablet Take 1-2 mg by mouth 2 (two) times daily as needed for anxiety or sleep.     [provider]  ?carboxymethylcellulose (REFRESH PLUS) 0.5 % SOLN Place 2 drops into both eyes daily as needed (for contacts).    [provider]  ?celecoxib (CELEBREX) 100 MG capsule Take 100 mg by mouth 2 (two) times daily. 02/21/21   [provider]  ?citalopram (CELEXA) 40 MG tablet Take 40 mg by mouth at bedtime.     [provider]  ?diclofenac (VOLTAREN) 75 MG EC tablet Take 1 tablet (75 mg total) by mouth 2 (two) times daily. 01/26/21   Ethelda Chick,  PA-C  ?lamoTRIgine (LAMICTAL) 25 MG tablet  01/10/21   [provider]  ?lisdexamfetamine (VYVANSE) 40 MG capsule Take 40 mg by mouth every morning.    [provider]  ?metroNIDAZOLE (FLAGYL) 500 MG tablet Take 1 tablet (500 mg total) by mouth 2 (two) times daily. 05/24/21   Orma Render, NP  ? ? ?ROS: All other systems have been reviewed and were otherwise negative with the exception of those mentioned in the HPI and as above. ? ?Physical Exam: ?General: Alert, no acute distress ?Cardiovascular: No pedal edema ?Respiratory: No cyanosis, no use of accessory musculature ?GI: No organomegaly, abdomen is soft and non-tender ?Skin: No lesions in the area of chief complaint ?Neurologic: Sensation intact distally ?Psychiatric: Patient is competent for consent with normal mood and affect ?Lymphatic: No axillary or cervical lymphadenopathy ? ?MUSCULOSKELETAL:  ?Left knee: Incisions are benign.  Range of motion from 0 to 60 degrees.  I was able to passively flex her to about 80 degrees.  Ligamentous exam is stable.   ? ?Imaging: ?None today ? ?Assessment: ?ANKYLOSIS LEFT KNEE ? ?Plan: ?Plan for Procedure(s): ?ARTHROSCOPY KNEE, MANIPULATION WITH LYSIS OF ADHESIONS ? ?The risks benefits and alternatives were discussed with the patient including but not limited to the risks of nonoperative treatment, versus surgical intervention including infection, bleeding, nerve injury,  blood clots, cardiopulmonary complications, morbidity, mortality, among others, and they were willing to proceed.  ? ?We discussed the need for aggressive physical therapy after this, as there is still a risk for her to become stiff even afterwards.  Patient stated her understanding of this and is willing to proceed.  ? ?The patient acknowledged the explanation, agreed to proceed with the plan and consent was signed.  ? ?Operative Plan: left knee arthroscopy, lysis of adhesions, manipulation under anesthesia  ?Discharge Medications:  standard ?DVT Prophylaxis: aspirin ?Physical Therapy: aggressive physical therapy ?Special Discharge needs: +/- ? ? ?Ethelda Chick, PA-C ? ?06/05/2021 ?5:38 PM ? ?

## 2021-06-07 NOTE — Discharge Instructions (Signed)
Ramond Marrow MD, MPH ?Alfonse Alpers, PA-C ?[facility] ?[address] ?[phone] (tel)   ?[phone] (fax) ? ? ?POST-OPERATIVE INSTRUCTIONS - Knee Arthroscopy ? ?WOUND CARE ?- You may remove the Operative Dressing on Post-Op Day #3 (72hrs after surgery).   ?-  Alternatively if you would like you can leave dressing on until follow-up if within 7-8 days but keep it dry. ?- Leave steri-strips in place until they fall off on their own, usually 2 weeks postop. ?- An ACE wrap may be used to control swelling, do not wrap this too tight.  If the initial ACE wrap feels too tight you may loosen it. ?- There may be a small amount of fluid/bleeding leaking at the surgical site.  ?- This is normal; the knee is filled with fluid during the procedure and can leak for 24-48hrs after surgery. You may change/reinforce the bandage as needed.  ?- Use the Cryocuff or Ice as often as possible for the first 7 days, then as needed for pain relief. Always keep a towel, ACE wrap or other barrier between the cooling unit and your skin.  ?- You may shower on Post-Op Day #3. Gently pat the area dry. Do not soak the knee in water or submerge it.  ?- Do not go swimming in the pool or ocean until 4 weeks after surgery or when otherwise instructed.  Keep incisions as dry as possible. ? ? ?BRACE/AMBULATION ?-  You will not need a brace after this procedure.   ?- You can put full weight on your operative leg as you tolerate ? ?REGIONAL ANESTHESIA (NERVE BLOCKS) ?- The anesthesia team may have performed a nerve block for you if safe in the setting of your care.  This is a great tool used to minimize pain.  Typically the block may start wearing off overnight.  This can be a challenging period but please utilize your as needed pain medications to try and manage this period and know it will be a brief transition as the nerve block wears completely  ? ?POST-OP MEDICATIONS - Multimodal approach to pain control ?- In  general your pain will be controlled with a combination of substances.  Prescriptions unless otherwise discussed are electronically sent to your pharmacy.  This is a carefully made plan we use to minimize narcotic use.    ? ?- Meloxicam - Anti-inflammatory medication taken on a scheduled basis ?- Acetaminophen - Non-narcotic pain medicine taken on a scheduled basis  ?- Tramadol - This is a strong narcotic, to be used only on an "as needed" basis for SEVERE pain. ?- Aspirin 81mg  - This medicine is used to minimize the risk of blood clots after surgery. ?-  Zofran - take as needed for nausea ? ? ?FOLLOW-UP  ?? Please call the office to schedule a follow-up appointment for your incision check, 7-10 days post-operatively. ? ?? IF YOU HAVE ANY QUESTIONS, PLEASE FEEL FREE TO CALL OUR OFFICE. ? ? ?HELPFUL INFORMATION ? ?- If you had a block, it will wear off between 8-24 hrs postop typically.  This is period when your pain may go from nearly zero to the pain you would have had post-op without the block.  This is an abrupt transition but nothing dangerous is happening.  You may take an extra dose of narcotic when this happens. ? ?? Keep your leg elevated to decrease swelling, which will then in turn decrease your pain. I would elevate the foot of your bed  by putting a couple of couch pillows between your mattress and box spring. I would not keep pillow directly under your ankle. ? ?- Do not sleep with a pillow behind your knee even if it is more comfortable as this may make it harder to get your knee fully straight long term. ? ?? There will be MORE swelling on days 1-3 than there is on the day of surgery.  This also is normal. The swelling will decrease with the anti-inflammatory medication, ice and keeping it elevated. The swelling will make it more difficult to bend your knee. As the swelling goes down your motion will become easier ? ?? You may develop swelling and bruising that extends from your knee down to your calf  and perhaps even to your foot over the next week. Do not be alarmed. This too is normal, and it is due to gravity ? ?? There may be some numbness adjacent to the incision site. This may last for 6-12 months or longer in some patients and is expected. ? ?? You may return to sedentary work/school in the next couple of days when you feel up to it. You will need to keep your leg elevated as much as possible  ? ?? You should wean off your narcotic medicines as soon as you are able.  Most patients will be off or using minimal narcotics before their first postop appointment.  ? ?? We suggest you use the pain medication the first night prior to going to bed, in order to ease any pain when the anesthesia wears off. You should avoid taking pain medications on an empty stomach as it will make you nauseous. ? ?? Do not drink alcoholic beverages or take illicit drugs when taking pain medications. ? ?? It is against the law to drive while taking narcotics. You cannot drive if your Right leg is in brace locked in extension. ? ?? Pain medication may make you constipated.  Below are a few solutions to try in this order: ? o Decrease the amount of pain medication if you aren't having pain. ? o Drink lots of decaffeinated fluids. ? o Drink prune juice and/or eat dried prunes ? ? o If the first 3 don't work start with additional solutions ? o Take Colace - an over-the-counter stool softener ? o Take Senokot - an over-the-counter laxative ? o Take Miralax - a stronger over-the-counter laxative ? ? ? ?For more information including helpful videos and documents visit our website:  ? ?[email or web address] ? ? ? ?

## 2021-06-20 ENCOUNTER — Ambulatory Visit (HOSPITAL_BASED_OUTPATIENT_CLINIC_OR_DEPARTMENT_OTHER): Payer: 59 | Admitting: Physical Therapy

## 2021-06-22 ENCOUNTER — Encounter (HOSPITAL_BASED_OUTPATIENT_CLINIC_OR_DEPARTMENT_OTHER): Payer: Self-pay

## 2021-06-22 ENCOUNTER — Ambulatory Visit (HOSPITAL_BASED_OUTPATIENT_CLINIC_OR_DEPARTMENT_OTHER): Payer: Medicaid Other | Admitting: Physical Therapy

## 2021-06-22 ENCOUNTER — Ambulatory Visit (HOSPITAL_BASED_OUTPATIENT_CLINIC_OR_DEPARTMENT_OTHER): Admit: 2021-06-22 | Payer: Medicaid Other | Admitting: Orthopaedic Surgery

## 2021-06-22 DIAGNOSIS — Z419 Encounter for procedure for purposes other than remedying health state, unspecified: Secondary | ICD-10-CM | POA: Diagnosis not present

## 2021-06-22 SURGERY — ARTHROSCOPY, KNEE
Anesthesia: Choice | Site: Knee | Laterality: Left

## 2021-06-23 ENCOUNTER — Encounter (HOSPITAL_BASED_OUTPATIENT_CLINIC_OR_DEPARTMENT_OTHER): Payer: Medicaid Other | Admitting: Physical Therapy

## 2021-06-23 ENCOUNTER — Ambulatory Visit (HOSPITAL_BASED_OUTPATIENT_CLINIC_OR_DEPARTMENT_OTHER): Payer: Medicaid Other | Attending: Nurse Practitioner | Admitting: Physical Therapy

## 2021-06-26 ENCOUNTER — Ambulatory Visit (HOSPITAL_BASED_OUTPATIENT_CLINIC_OR_DEPARTMENT_OTHER): Payer: Medicaid Other | Admitting: Physical Therapy

## 2021-06-26 ENCOUNTER — Encounter (HOSPITAL_BASED_OUTPATIENT_CLINIC_OR_DEPARTMENT_OTHER): Payer: Medicaid Other | Admitting: Physical Therapy

## 2021-06-27 ENCOUNTER — Encounter (HOSPITAL_BASED_OUTPATIENT_CLINIC_OR_DEPARTMENT_OTHER): Payer: Medicaid Other | Admitting: Physical Therapy

## 2021-06-27 ENCOUNTER — Ambulatory Visit (HOSPITAL_BASED_OUTPATIENT_CLINIC_OR_DEPARTMENT_OTHER): Payer: Medicaid Other | Admitting: Physical Therapy

## 2021-06-28 ENCOUNTER — Encounter (HOSPITAL_BASED_OUTPATIENT_CLINIC_OR_DEPARTMENT_OTHER): Payer: Medicaid Other | Admitting: Physical Therapy

## 2021-06-29 ENCOUNTER — Encounter (HOSPITAL_BASED_OUTPATIENT_CLINIC_OR_DEPARTMENT_OTHER): Payer: Medicaid Other | Admitting: Physical Therapy

## 2021-06-30 ENCOUNTER — Encounter (HOSPITAL_BASED_OUTPATIENT_CLINIC_OR_DEPARTMENT_OTHER): Payer: Medicaid Other | Admitting: Physical Therapy

## 2021-07-22 DIAGNOSIS — Z419 Encounter for procedure for purposes other than remedying health state, unspecified: Secondary | ICD-10-CM | POA: Diagnosis not present

## 2021-08-22 DIAGNOSIS — Z419 Encounter for procedure for purposes other than remedying health state, unspecified: Secondary | ICD-10-CM | POA: Diagnosis not present

## 2021-09-22 DIAGNOSIS — Z419 Encounter for procedure for purposes other than remedying health state, unspecified: Secondary | ICD-10-CM | POA: Diagnosis not present

## 2021-09-26 ENCOUNTER — Telehealth: Payer: Medicaid Other

## 2021-10-12 DIAGNOSIS — F410 Panic disorder [episodic paroxysmal anxiety] without agoraphobia: Secondary | ICD-10-CM | POA: Diagnosis not present

## 2021-10-12 DIAGNOSIS — F331 Major depressive disorder, recurrent, moderate: Secondary | ICD-10-CM | POA: Diagnosis not present

## 2021-10-22 DIAGNOSIS — Z419 Encounter for procedure for purposes other than remedying health state, unspecified: Secondary | ICD-10-CM | POA: Diagnosis not present

## 2021-11-22 DIAGNOSIS — Z419 Encounter for procedure for purposes other than remedying health state, unspecified: Secondary | ICD-10-CM | POA: Diagnosis not present

## 2021-12-22 DIAGNOSIS — Z419 Encounter for procedure for purposes other than remedying health state, unspecified: Secondary | ICD-10-CM | POA: Diagnosis not present

## 2022-01-22 DIAGNOSIS — Z419 Encounter for procedure for purposes other than remedying health state, unspecified: Secondary | ICD-10-CM | POA: Diagnosis not present

## 2022-01-24 ENCOUNTER — Telehealth: Payer: Medicaid Other | Admitting: Nurse Practitioner

## 2022-01-24 NOTE — Progress Notes (Signed)
E vistis are only for 18years and older.  If you would like your child evaluated by a virtual provider you can go to the [facility] Virtual Visit website and choose Virtual Urgent Care appointment which is a video visit. We can see children through that platform. Please be sure to schedule under the child's name and not your own.   Thank you   NOTE: You will NOT be charged for this eVisit.  If you do not have a PCP, [facility] offers a free physician referral service available at [phone]. Our trained staff has the experience, knowledge and resources to put you in touch with a physician who is right for you.    If you are having a true medical emergency please call 911.   Your e-visit answers were reviewed by a board certified advanced clinical practitioner to complete your personal care plan.  Thank you for using e-Visits.

## 2022-02-22 DIAGNOSIS — Z419 Encounter for procedure for purposes other than remedying health state, unspecified: Secondary | ICD-10-CM | POA: Diagnosis not present

## 2022-03-12 DIAGNOSIS — F902 Attention-deficit hyperactivity disorder, combined type: Secondary | ICD-10-CM | POA: Diagnosis not present

## 2022-03-12 DIAGNOSIS — F3341 Major depressive disorder, recurrent, in partial remission: Secondary | ICD-10-CM | POA: Diagnosis not present

## 2022-03-23 DIAGNOSIS — Z419 Encounter for procedure for purposes other than remedying health state, unspecified: Secondary | ICD-10-CM | POA: Diagnosis not present

## 2022-04-23 DIAGNOSIS — Z419 Encounter for procedure for purposes other than remedying health state, unspecified: Secondary | ICD-10-CM | POA: Diagnosis not present

## 2022-05-23 ENCOUNTER — Telehealth: Payer: Self-pay

## 2022-05-23 DIAGNOSIS — Z419 Encounter for procedure for purposes other than remedying health state, unspecified: Secondary | ICD-10-CM | POA: Diagnosis not present

## 2022-05-23 NOTE — Telephone Encounter (Signed)
Sending mychart msg. AS, CMA 

## 2022-06-23 DIAGNOSIS — Z419 Encounter for procedure for purposes other than remedying health state, unspecified: Secondary | ICD-10-CM | POA: Diagnosis not present

## 2022-07-23 DIAGNOSIS — Z419 Encounter for procedure for purposes other than remedying health state, unspecified: Secondary | ICD-10-CM | POA: Diagnosis not present

## 2022-08-01 DIAGNOSIS — R4589 Other symptoms and signs involving emotional state: Secondary | ICD-10-CM | POA: Diagnosis not present

## 2022-08-01 DIAGNOSIS — R1111 Vomiting without nausea: Secondary | ICD-10-CM | POA: Diagnosis not present

## 2022-08-01 DIAGNOSIS — Z743 Need for continuous supervision: Secondary | ICD-10-CM | POA: Diagnosis not present

## 2022-08-01 DIAGNOSIS — R197 Diarrhea, unspecified: Secondary | ICD-10-CM | POA: Diagnosis not present

## 2022-08-01 DIAGNOSIS — R0789 Other chest pain: Secondary | ICD-10-CM | POA: Diagnosis not present

## 2022-08-23 DIAGNOSIS — Z419 Encounter for procedure for purposes other than remedying health state, unspecified: Secondary | ICD-10-CM | POA: Diagnosis not present

## 2022-09-18 ENCOUNTER — Telehealth: Payer: Self-pay

## 2022-09-18 NOTE — Telephone Encounter (Signed)
Hi,   I am reaching out to offer help in scheduling a physical appointment with Patricia Skeens, NP. Please let me know what time of the day and day of the week work best for your schedule.   I look forward to hearing from you.   Kind regards,  Patricia Jensen, [facility] Health Specialist

## 2022-09-23 DIAGNOSIS — Z419 Encounter for procedure for purposes other than remedying health state, unspecified: Secondary | ICD-10-CM | POA: Diagnosis not present

## 2022-10-23 DIAGNOSIS — Z419 Encounter for procedure for purposes other than remedying health state, unspecified: Secondary | ICD-10-CM | POA: Diagnosis not present

## 2022-11-23 DIAGNOSIS — Z419 Encounter for procedure for purposes other than remedying health state, unspecified: Secondary | ICD-10-CM | POA: Diagnosis not present

## 2022-12-10 ENCOUNTER — Telehealth: Payer: Medicaid Other | Admitting: Physician Assistant

## 2022-12-10 DIAGNOSIS — R051 Acute cough: Secondary | ICD-10-CM | POA: Diagnosis not present

## 2022-12-10 MED ORDER — AZITHROMYCIN 250 MG PO TABS: ORAL_TABLET | ORAL | 0 refills | Status: AC

## 2022-12-10 MED ORDER — ALBUTEROL SULFATE HFA 108 (90 BASE) MCG/ACT IN AERS: 1.0000 | INHALATION_SPRAY | Freq: Four times a day (QID) | RESPIRATORY_TRACT | 0 refills | Status: DC | PRN

## 2022-12-10 MED ORDER — BENZONATATE 100 MG PO CAPS: 100.0000 mg | ORAL_CAPSULE | Freq: Three times a day (TID) | ORAL | 0 refills | Status: DC | PRN

## 2022-12-10 NOTE — Progress Notes (Signed)
E-Visit for Cough   We are sorry that you are not feeling well.  Here is how we plan to help!  Based on your presentation I believe you most likely have A cough due to bacteria.  When patients have a fever and a productive cough with a change in color or increased sputum production, we are concerned about bacterial bronchitis.  If left untreated it can progress to pneumonia.  If your symptoms do not improve with your treatment plan it is important that you contact your provider.   I have prescribed Azithromyin 250 mg: two tablets now and then one tablet daily for 4 additonal days    In addition you may use A prescription cough medication called Tessalon Perles 100mg . You may take 1-2 capsules every 8 hours as needed for your cough.    From your responses in the eVisit questionnaire you describe inflammation in the upper respiratory tract which is causing a significant cough.  This is commonly called Bronchitis and has four common causes:   Allergies Viral Infections Acid Reflux Bacterial Infection Allergies, viruses and acid reflux are treated by controlling symptoms or eliminating the cause. An example might be a cough caused by taking certain blood pressure medications. You stop the cough by changing the medication. Another example might be a cough caused by acid reflux. Controlling the reflux helps control the cough.  USE OF BRONCHODILATOR ("RESCUE") INHALERS: There is a risk from using your bronchodilator too frequently.  The risk is that over-reliance on a medication which only relaxes the muscles surrounding the breathing tubes can reduce the effectiveness of medications prescribed to reduce swelling and congestion of the tubes themselves.  Although you feel brief relief from the bronchodilator inhaler, your asthma may actually be worsening with the tubes becoming more swollen and filled with mucus.  This can delay other crucial treatments, such as oral steroid medications. If you need to  use a bronchodilator inhaler daily, several times per day, you should discuss this with your provider.  There are probably better treatments that could be used to keep your asthma under control.     HOME CARE Only take medications as instructed by your medical team. Complete the entire course of an antibiotic. Drink plenty of fluids and get plenty of rest. Avoid close contacts especially the very young and the elderly Cover your mouth if you cough or cough into your sleeve. Always remember to wash your hands A steam or ultrasonic humidifier can help congestion.   GET HELP RIGHT AWAY IF: You develop worsening fever. You become short of breath You cough up blood. Your symptoms persist after you have completed your treatment plan MAKE SURE YOU  Understand these instructions. Will watch your condition. Will get help right away if you are not doing well or get worse.    Thank you for choosing an e-visit.  Your e-visit answers were reviewed by a board certified advanced clinical practitioner to complete your personal care plan. Depending upon the condition, your plan could have included both over the counter or prescription medications.  Please review your pharmacy choice. Make sure the pharmacy is open so you can pick up prescription now. If there is a problem, you may contact your provider through [employer] and have the prescription routed to another pharmacy.  Your safety is important to [place]. If you have drug allergies check your prescription carefully.   For the next 24 hours you can use MyChart to ask questions about today's visit, request  a non-urgent call back, or ask for a work or school excuse. You will get an email in the next two days asking about your experience. I hope that your e-visit has been valuable and will speed your recovery.  I have spent 5 minutes in review of e-visit questionnaire, review and updating patient chart, medical decision making and response to  patient.   Tylene Fantasia Ward, PA-C

## 2022-12-10 NOTE — Addendum Note (Signed)
Addended by: Margaretann Loveless on: 12/10/2022 05:04 PM   Modules accepted: Orders

## 2022-12-14 ENCOUNTER — Telehealth: Payer: Medicaid Other

## 2022-12-18 ENCOUNTER — Encounter: Payer: Self-pay | Admitting: Nurse Practitioner

## 2022-12-19 NOTE — Progress Notes (Signed)
Error, needed for son.  This encounter was created in error - please disregard.

## 2022-12-23 DIAGNOSIS — Z419 Encounter for procedure for purposes other than remedying health state, unspecified: Secondary | ICD-10-CM | POA: Diagnosis not present

## 2023-01-23 DIAGNOSIS — Z419 Encounter for procedure for purposes other than remedying health state, unspecified: Secondary | ICD-10-CM | POA: Diagnosis not present

## 2023-02-23 DIAGNOSIS — Z419 Encounter for procedure for purposes other than remedying health state, unspecified: Secondary | ICD-10-CM | POA: Diagnosis not present

## 2023-02-28 ENCOUNTER — Encounter: Payer: Self-pay | Admitting: Nurse Practitioner

## 2023-02-28 ENCOUNTER — Telehealth: Payer: Medicaid Other

## 2023-02-28 DIAGNOSIS — F330 Major depressive disorder, recurrent, mild: Secondary | ICD-10-CM | POA: Diagnosis not present

## 2023-02-28 DIAGNOSIS — Z5181 Encounter for therapeutic drug level monitoring: Secondary | ICD-10-CM | POA: Diagnosis not present

## 2023-02-28 DIAGNOSIS — F410 Panic disorder [episodic paroxysmal anxiety] without agoraphobia: Secondary | ICD-10-CM | POA: Diagnosis not present

## 2023-02-28 DIAGNOSIS — F331 Major depressive disorder, recurrent, moderate: Secondary | ICD-10-CM | POA: Diagnosis not present

## 2023-02-28 DIAGNOSIS — F902 Attention-deficit hyperactivity disorder, combined type: Secondary | ICD-10-CM | POA: Diagnosis not present

## 2023-03-14 ENCOUNTER — Telehealth: Payer: Medicaid Other | Admitting: Physician Assistant

## 2023-03-14 DIAGNOSIS — T300 Burn of unspecified body region, unspecified degree: Secondary | ICD-10-CM

## 2023-03-14 NOTE — Patient Instructions (Signed)
 Nicanor Alcon, thank you for joining Piedad Climes, PA-C for today's virtual visit.  While this provider is not your primary care provider (PCP), if your PCP is located in our provider database this encounter information will be shared with them immediately following your visit.   A [facility] MyChart account gives you access to today's visit and all your visits, tests, and labs performed at [facility] " click here if you don't have a [facility] MyChart account or go to [email or web address]  Consent: (Patient) Patricia Jensen provided verbal consent for this virtual visit at the beginning of the encounter.  Current Medications:  Current Outpatient Medications:    albuterol (VENTOLIN HFA) 108 (90 Base) MCG/ACT inhaler, Inhale 1-2 puffs into the lungs every 6 (six) hours as needed., Disp: 8 g, Rfl: 0   ALPRAZolam (XANAX XR) 1 MG 24 hr tablet, Take 1 mg by mouth 2 (two) times daily. , Disp: , Rfl:    ALPRAZolam (XANAX) 1 MG tablet, Take 1-2 mg by mouth 2 (two) times daily as needed for anxiety or sleep. , Disp: , Rfl:    benzonatate (TESSALON) 100 MG capsule, Take 1 capsule (100 mg total) by mouth 3 (three) times daily as needed., Disp: 20 capsule, Rfl: 0   carboxymethylcellulose (REFRESH PLUS) 0.5 % SOLN, Place 2 drops into both eyes daily as needed (for contacts)., Disp: , Rfl:    celecoxib (CELEBREX) 100 MG capsule, Take 100 mg by mouth 2 (two) times daily., Disp: , Rfl:    citalopram (CELEXA) 40 MG tablet, Take 40 mg by mouth at bedtime. , Disp: , Rfl:    diclofenac (VOLTAREN) 75 MG EC tablet, Take 1 tablet (75 mg total) by mouth 2 (two) times daily., Disp: 60 tablet, Rfl: 0   lamoTRIgine (LAMICTAL) 25 MG tablet, , Disp: , Rfl:    lisdexamfetamine (VYVANSE) 40 MG capsule, Take 40 mg by mouth every morning., Disp: , Rfl:    metroNIDAZOLE (FLAGYL) 500 MG tablet, Take 1 tablet (500 mg total) by mouth 2 (two) times daily., Disp: 14 tablet, Rfl: 0   Medications ordered  in this encounter:  No orders of the defined types were placed in this encounter.    *If you need refills on other medications prior to your next appointment, please contact your pharmacy*  Follow-Up: Call back or seek an in-person evaluation if the symptoms worsen or if the condition fails to improve as anticipated.  [facility] [phone]  Other Instructions  [facility]      Emergency Department-[facility]  Get Driving Directions  [phone]  [address]  [place], [place] [address]  Open 24/7/365      [facility] Emergency Department at [facility]  Get Driving Directions  [address]  [place], [place] [address]  Open 24/7/365    Emergency Department- [facility]  Get Driving Directions  [phone]  [address]  [place], [place] [address]  Open 24/7/365      [facility] at [facility]  Get Driving Directions  [phone]  [address]  [place], [place] [address]  Open 24/7/365    [facility]  Emergency Department- [facility]  Get Driving Directions  [phone]  [address]  [place], [place] [address]  Open 24/7/365    [place]  Emergency Department- [facility]  Get Driving Directions  [address]  [place], [place] [address]  Open 24/7/365    [place]  Emergency Department- [facility]  Get Driving Directions  [phone]  [address]  [place], [place] [address]  Open 24/7/365
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
## 2023-03-15 ENCOUNTER — Emergency Department (HOSPITAL_COMMUNITY)
Admission: EM | Admit: 2023-03-15 | Discharge: 2023-03-15 | Disposition: A | Discharge status: Home / Self Care | Payer: Medicaid Other | Attending: Emergency Medicine | Admitting: Emergency Medicine

## 2023-03-15 ENCOUNTER — Encounter (HOSPITAL_COMMUNITY): Payer: Self-pay | Admitting: Emergency Medicine

## 2023-03-15 ENCOUNTER — Encounter (HOSPITAL_COMMUNITY): Payer: Self-pay

## 2023-03-15 ENCOUNTER — Ambulatory Visit (HOSPITAL_COMMUNITY)
Admission: EM | Admit: 2023-03-15 | Discharge: 2023-03-15 | Disposition: A | Discharge status: Home / Self Care | Payer: Medicaid Other

## 2023-03-15 ENCOUNTER — Other Ambulatory Visit: Payer: Self-pay

## 2023-03-15 ENCOUNTER — Emergency Department (HOSPITAL_COMMUNITY)
Admission: EM | Admit: 2023-03-15 | Discharge: 2023-03-15 | Disposition: A | Discharge status: Home / Self Care | Payer: Medicaid Other | Source: Home / Self Care | Attending: Emergency Medicine | Admitting: Emergency Medicine

## 2023-03-15 DIAGNOSIS — Z5321 Procedure and treatment not carried out due to patient leaving prior to being seen by health care provider: Secondary | ICD-10-CM | POA: Diagnosis not present

## 2023-03-15 DIAGNOSIS — Z23 Encounter for immunization: Secondary | ICD-10-CM | POA: Insufficient documentation

## 2023-03-15 DIAGNOSIS — T310 Burns involving less than 10% of body surface: Secondary | ICD-10-CM | POA: Insufficient documentation

## 2023-03-15 DIAGNOSIS — T22212A Burn of second degree of left forearm, initial encounter: Secondary | ICD-10-CM | POA: Insufficient documentation

## 2023-03-15 DIAGNOSIS — X101XXA Contact with hot food, initial encounter: Secondary | ICD-10-CM | POA: Insufficient documentation

## 2023-03-15 MED ORDER — TETANUS-DIPHTH-ACELL PERTUSSIS 5-2.5-18.5 LF-MCG/0.5 IM SUSY
0.5000 mL | PREFILLED_SYRINGE | Freq: Once | INTRAMUSCULAR | Status: AC
Start: 2023-03-15 — End: 2023-03-15
  Administered 2023-03-15: 0.5 mL via INTRAMUSCULAR
  Filled 2023-03-15: qty 0.5
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
## 2023-03-23 DIAGNOSIS — Z419 Encounter for procedure for purposes other than remedying health state, unspecified: Secondary | ICD-10-CM | POA: Diagnosis not present

## 2023-05-04 DIAGNOSIS — Z419 Encounter for procedure for purposes other than remedying health state, unspecified: Secondary | ICD-10-CM | POA: Diagnosis not present

## 2023-06-03 DIAGNOSIS — Z419 Encounter for procedure for purposes other than remedying health state, unspecified: Secondary | ICD-10-CM | POA: Diagnosis not present

## 2023-06-28 DIAGNOSIS — F3341 Major depressive disorder, recurrent, in partial remission: Secondary | ICD-10-CM | POA: Diagnosis not present

## 2023-08-08 IMAGING — CT CT KNEE*L* W/O CM
2 of 3 series · 12 of 33 positions shown, 15 images · non-contrast
Comparison: Radiographs 12/25/2020

CLINICAL DATA: Twisting injury left knee after fall.

EXAM:
CT OF THE left KNEE WITHOUT CONTRAST
TECHNIQUE: Multidetector CT imaging of the left knee was performed according to
the standard protocol. Multiplanar CT image reconstructions were
also generated.

[Series 4: lower ext 1.5 st · axial · 0.50mm/px · z∈[+373,+600]mm · 9 of 179 slices shown, 12 images]
[im 14/179  soft-tissue]
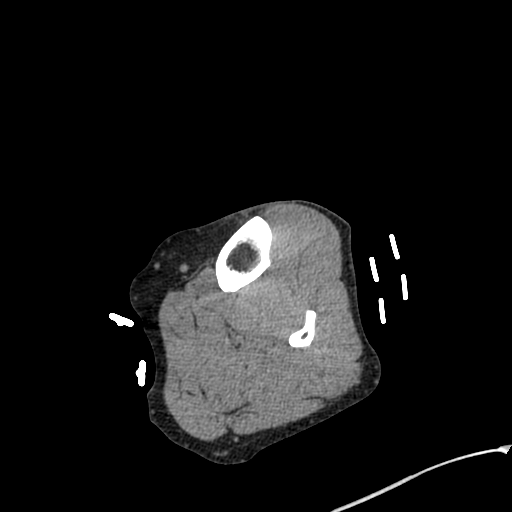
[im 14/179  bone]
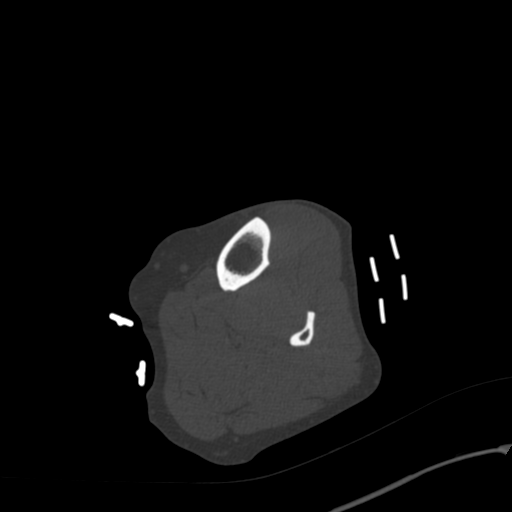
[im 42/179  bone]
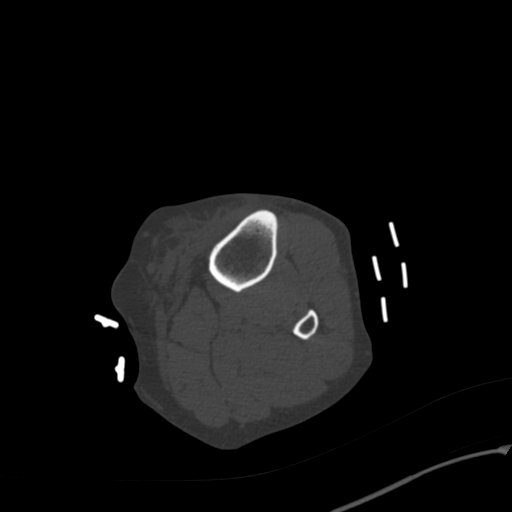
[im 55/179  bone]
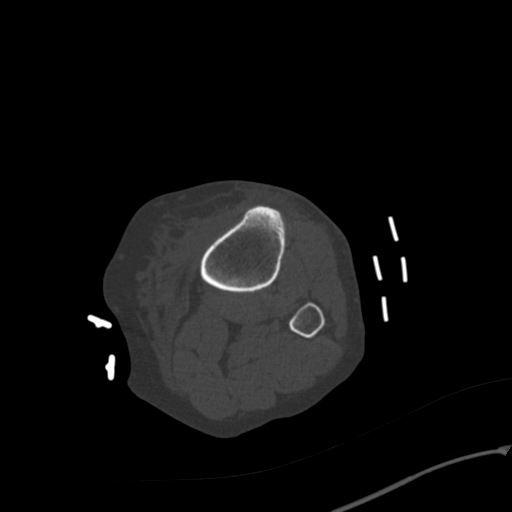
[im 69/179  bone]
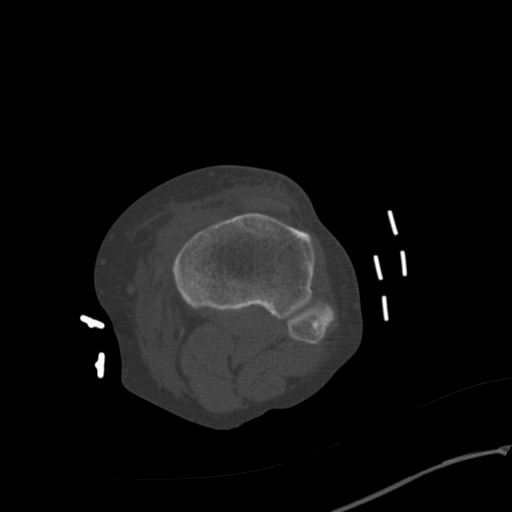
[im 96/179  soft-tissue]
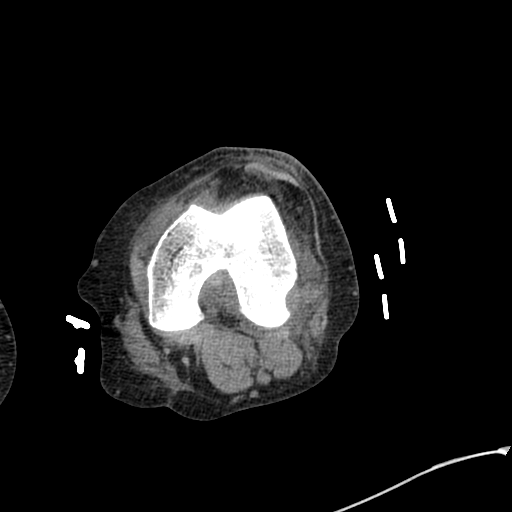
[im 96/179  bone]
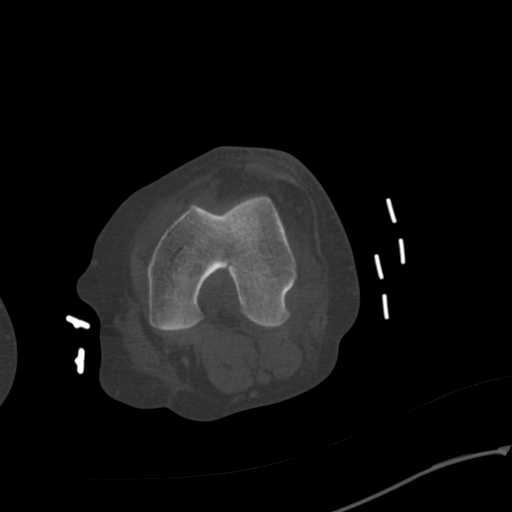
[im 110/179  bone]
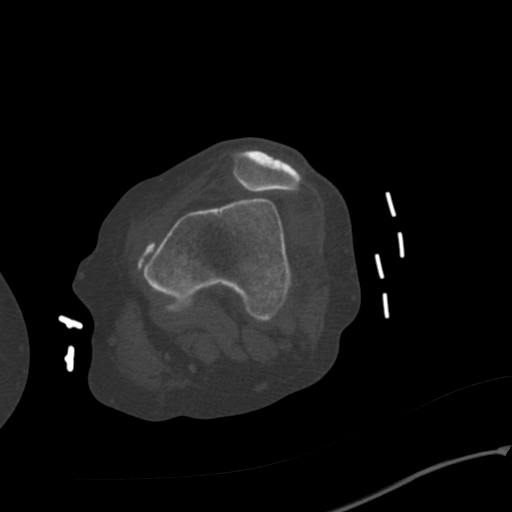
[im 124/179  bone]
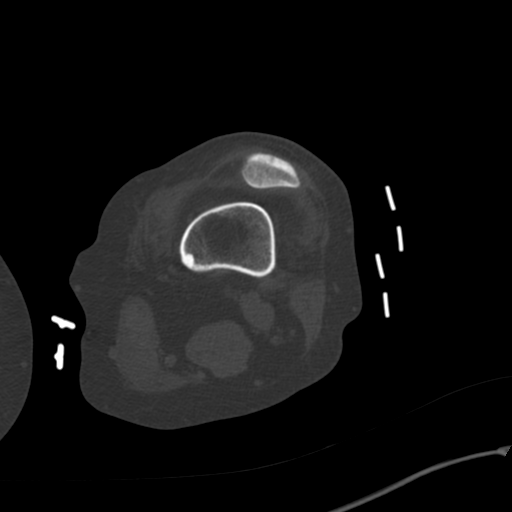
[im 151/179  bone]
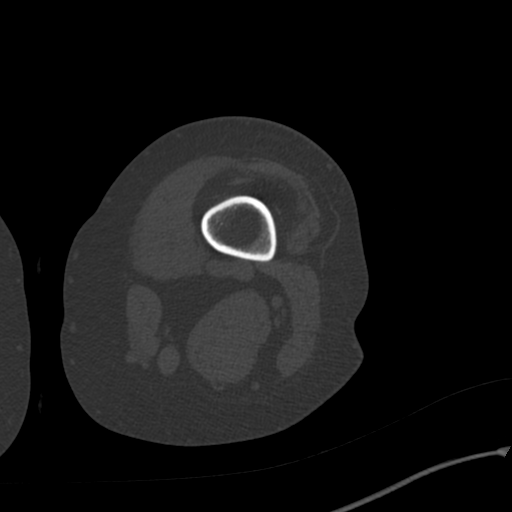
[im 165/179  soft-tissue]
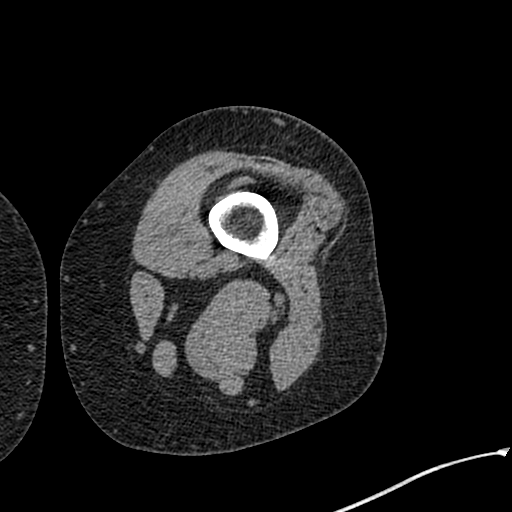
[im 165/179  bone]
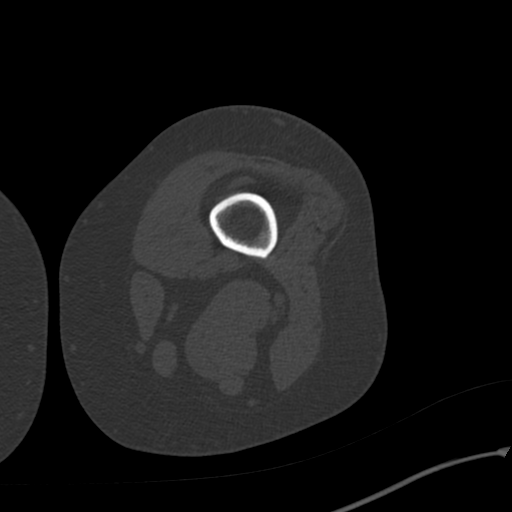

[Series 9: lower ext cor st · coronal · 0.44mm/px · 3 of 113 slices shown]
[im 23/113  bone]
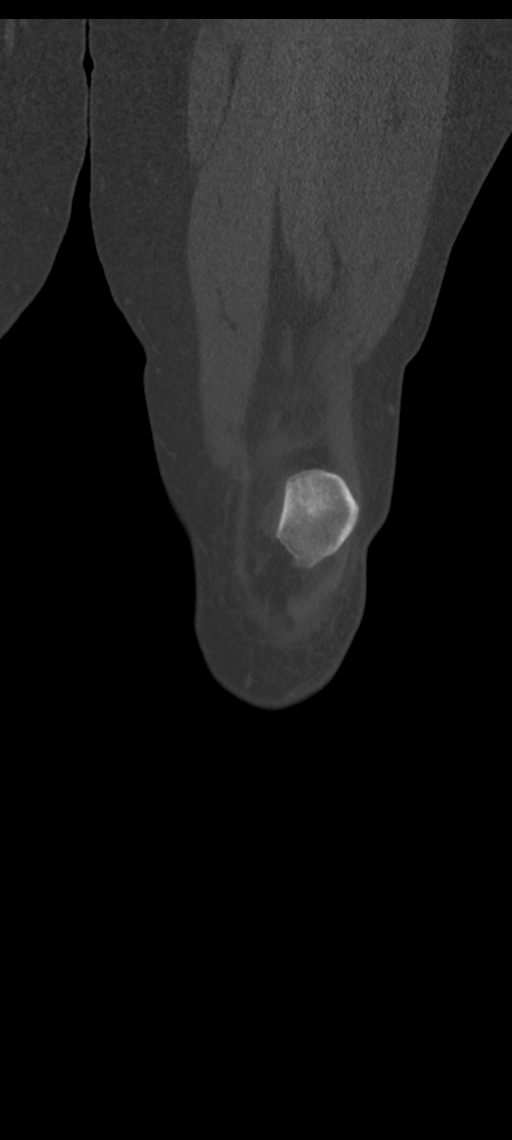
[im 45/113  bone]
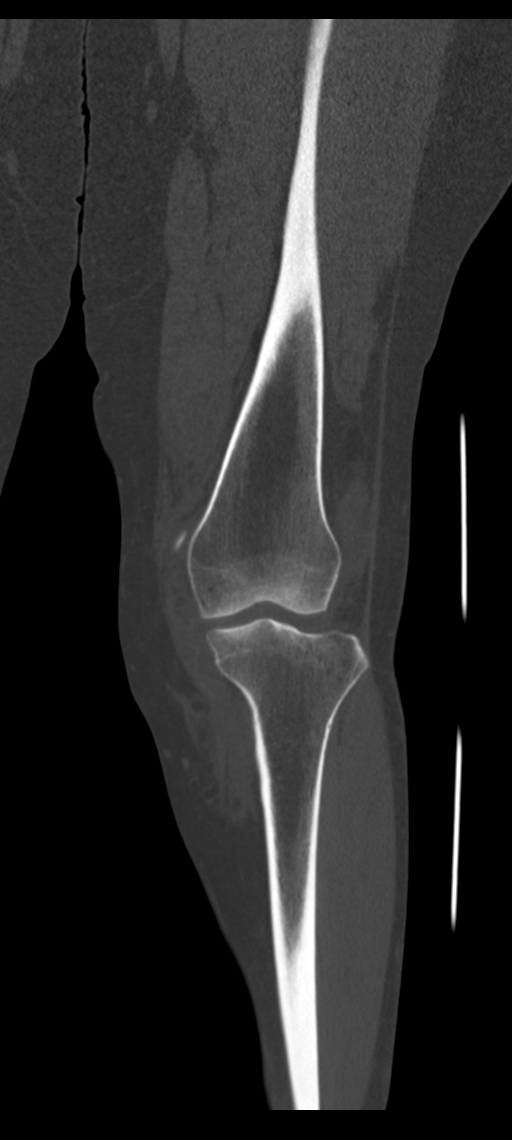
[im 68/113  bone]
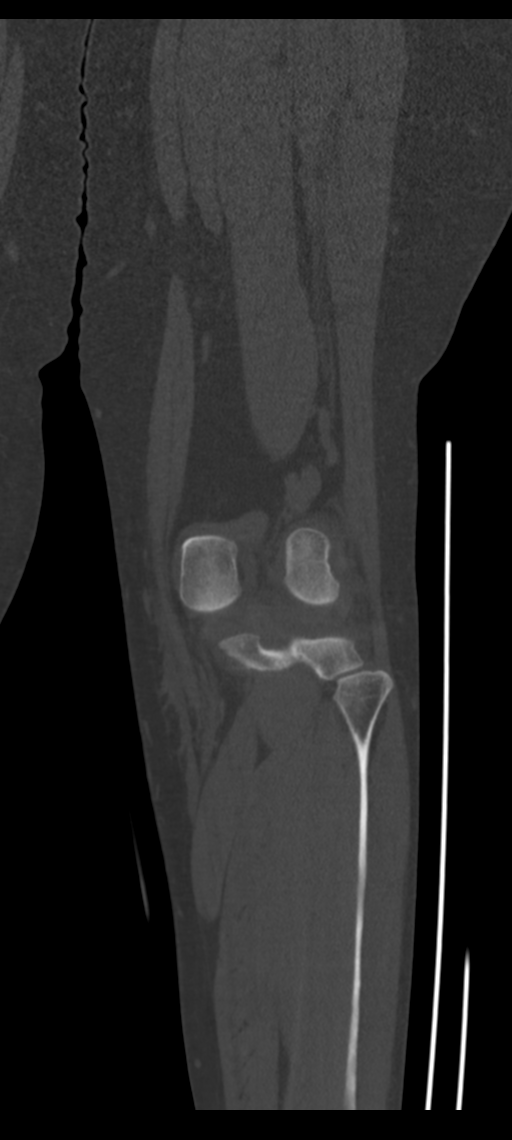

[12 of 33 positions shown; findings below may reference images not displayed]

FINDINGS: Bones/Joint/Cartilage

The patella that is subluxed laterally but not overtly dislocated at
this time. There is substantial abnormal edema tracking along the
medial retinaculum and expected region of the medial patellofemoral
ligament, which may be torn or injured. Accordingly the possibility
of recent transient patellar dislocation is a distinct possibility.

The 1.5 by 0.3 by 1.9 cm ossific structure medial to the medial
femoral epicondyle appears well corticated and without donor site,
and accordingly is thought represent Pellegrini-Stieda disease
rather than an acute avulsion.

Tibial tubercle-trochlear groove distance 2.1 cm, mildly abnormally
elevated.

Small knee joint effusion.

Ligaments

Suboptimally assessed by CT. Ossification along the proximal MCL
compatible with Pellegrini-Stieda disease.

Muscles and Tendons

There is considerable abnormal fluid signal along the posterior
sartorius muscle for example on image 55 of series 4., with
indistinctness of the distal sartorius tendon which could be torn.

Soft tissues

Edema tracks superficial to the pes anserinus. There is also some
low-level edema infiltrating the soft tissues posterior to the
distal biceps femoris musculotendinous junction for example on image
83 series 4.
IMPRESSION: 1. The patella is subluxed laterally but not overtly dislocated at
this time. Probably torn medial patellar retinaculum and medial
patellofemoral ligament given the degree of surrounding edema and
indistinctness.
2. Elevated tibial tubercle-trochlear groove distance at 2.1 cm.
3. The ossific structure medial to the medial femoral epicondyle is
well corticated and without donor site, and appearance is most
compatible with Pellegrini-Stieda disease rather than acute
avulsion.
4. Small knee joint effusion.
5. Abnormal fluid tracks along the posterior margin of the distal
sartorius muscle, and the distal sartorius tendon is indistinct and
could be torn. Edema tracks superficial to the pes anserinus.
6. Low-grade infiltrative edema posterior to the distal biceps
femoris musculotendinous junction, nonspecific.

## 2023-09-03 ENCOUNTER — Telehealth: Admitting: Physician Assistant

## 2023-09-03 NOTE — Progress Notes (Signed)
 Hi Narmeen,  Unfortunately we cannot treat pediatric patients via e-visit and you have submitted a request for your son through your own chart. You would need to set him up for a video visit through MyChart so that we could better help him. Otherwise, you would have to have him seen in person. You can go to [email or web address] to get him scheduled, or schedule directly through his MyChart account.

## 2024-01-11 IMAGING — US US SOFT TISSUE HEAD/NECK
1 series · 14 of 15 positions shown · non-contrast
Comparison: None Available.

CLINICAL DATA: Left neck occipital region palpable lymph nodes

EXAM:
ULTRASOUND OF HEAD/NECK SOFT TISSUES
TECHNIQUE: Ultrasound examination of the head and neck soft tissues was
performed in the area of clinical concern.

[Series 1: us soft tissue head/neck · 0.05mm/px · 14 of 15 slices shown]
[im 1/15]
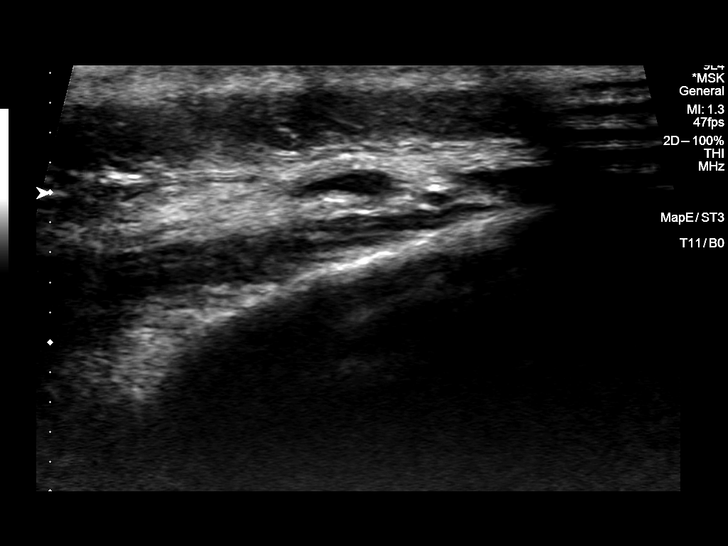
[im 2/15]
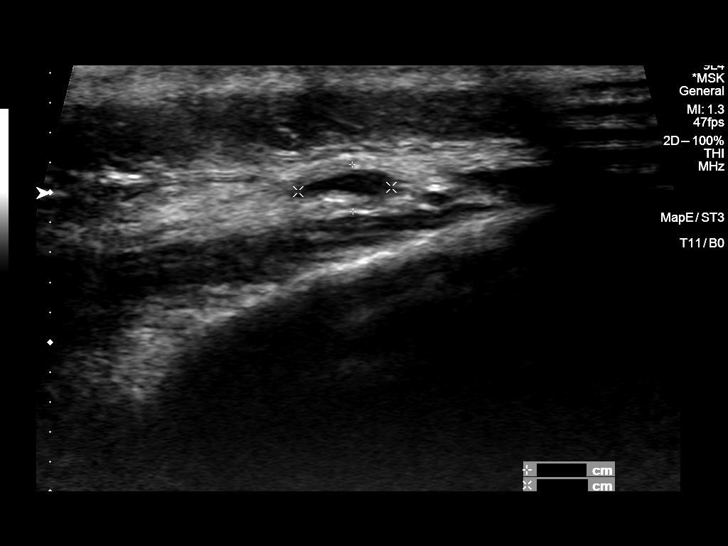
[im 3/15]
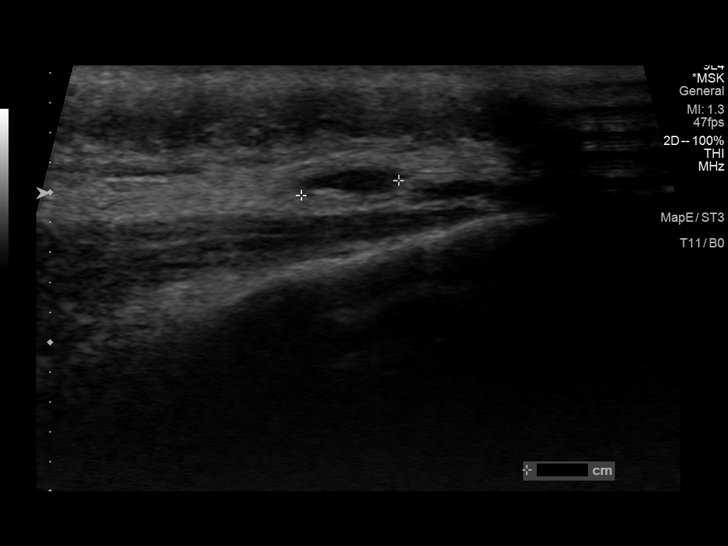
[im 4/15]
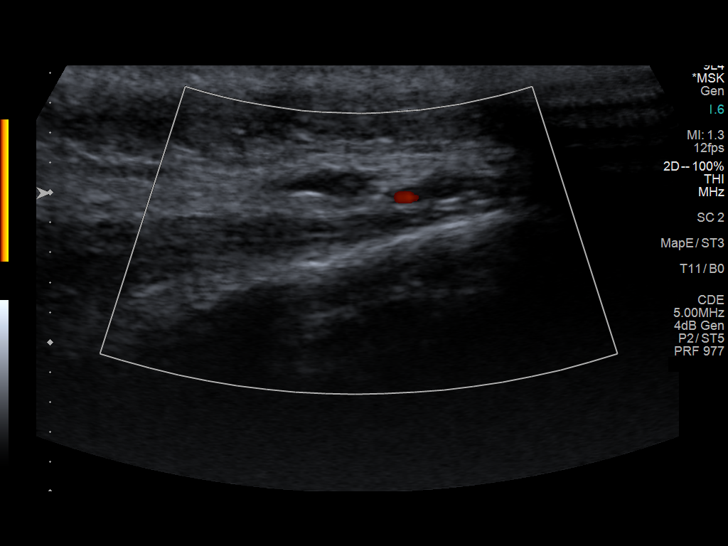
[im 5/15]
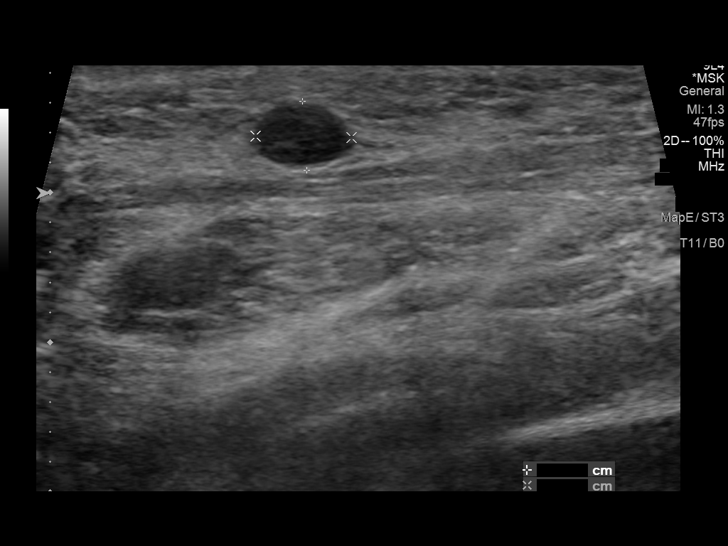
[im 6/15]
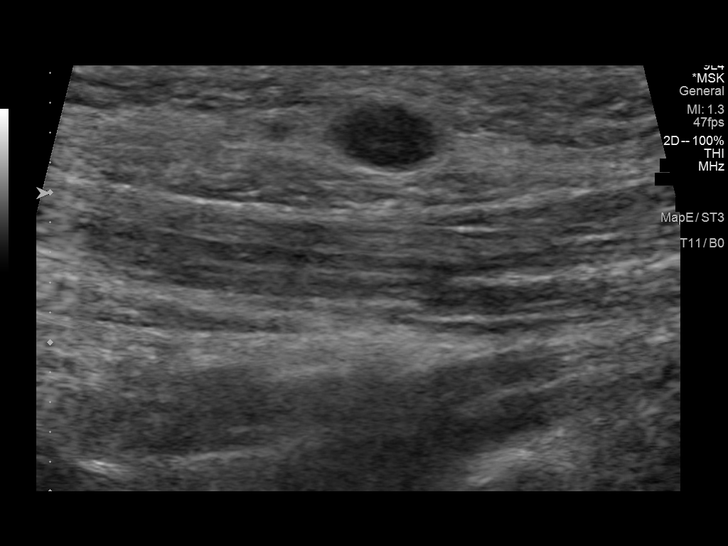
[im 7/15]
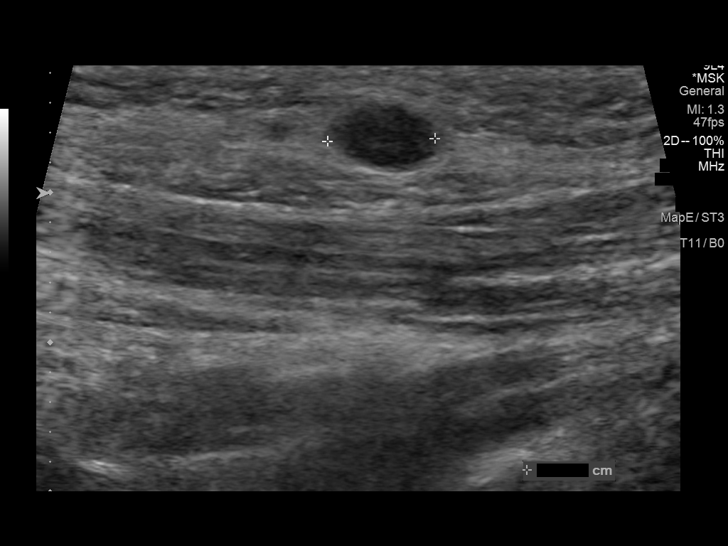
[im 9/15]
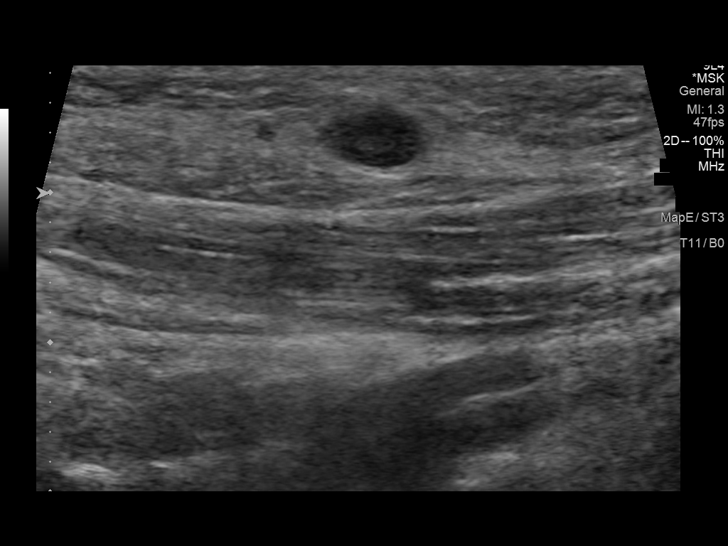
[im 10/15]
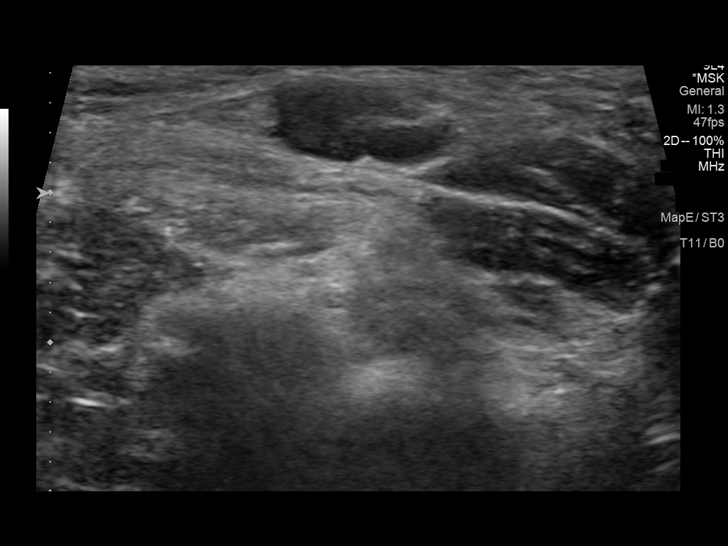
[im 11/15]
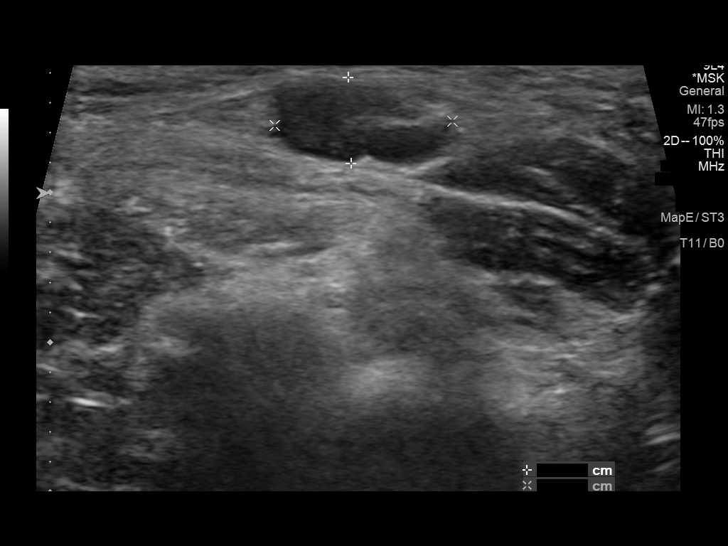
[im 12/15]
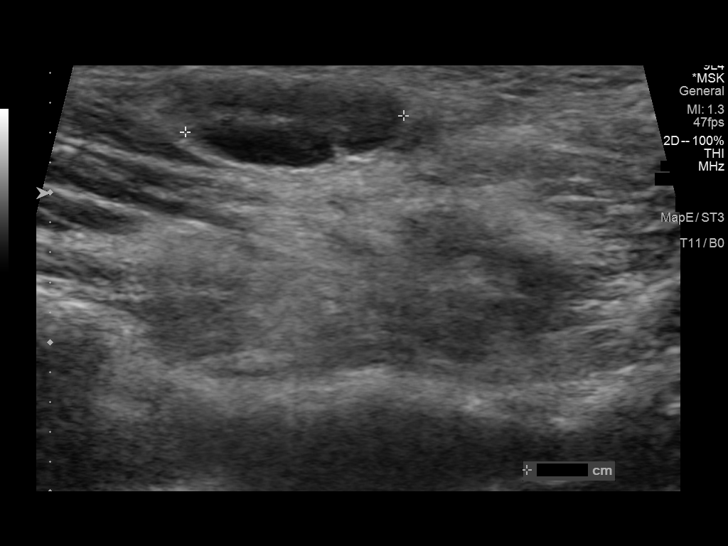
[im 13/15]
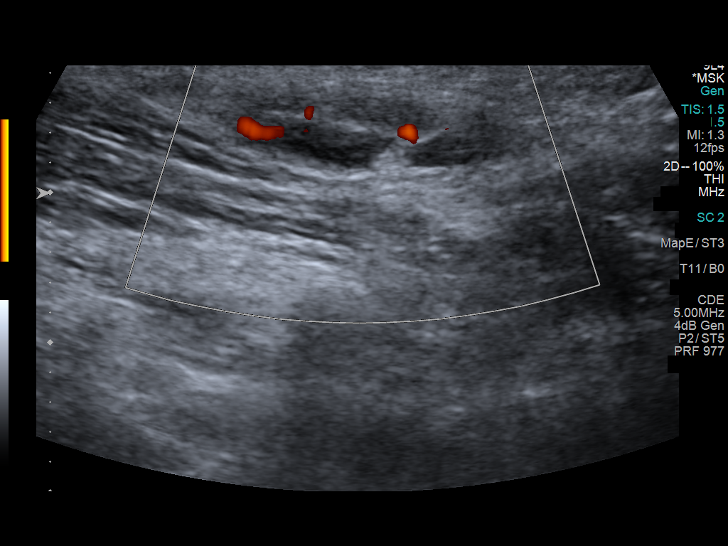
[im 14/15]
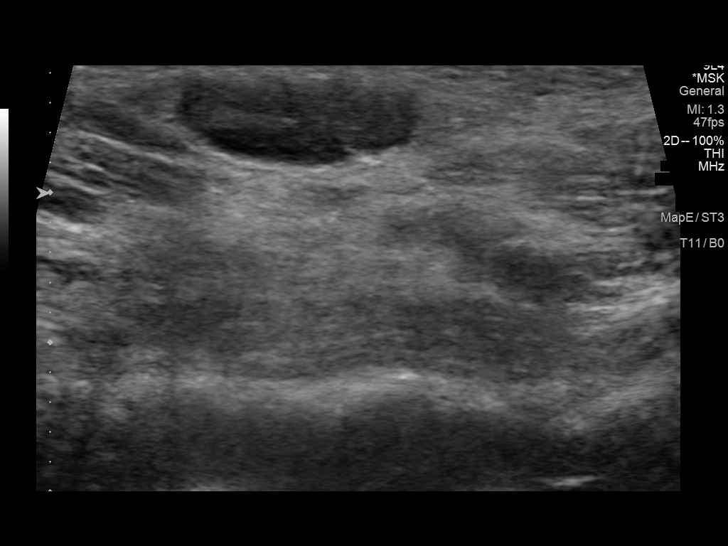
[im 15/15]
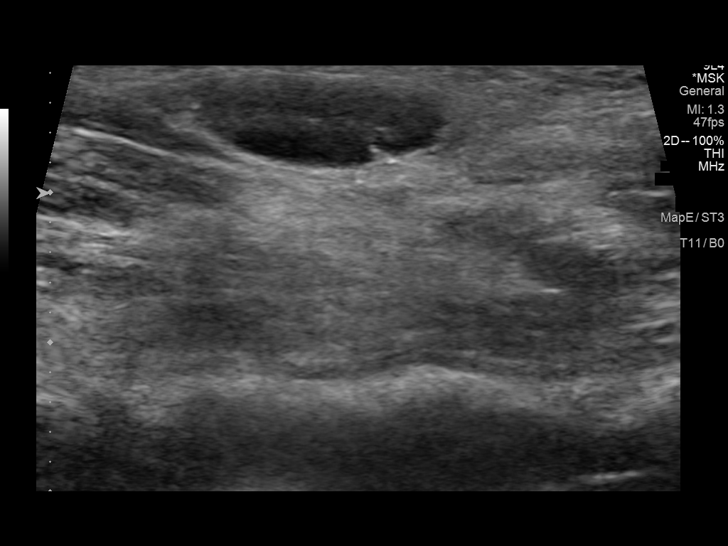

[14 of 15 positions shown; findings below may reference images not displayed]

FINDINGS: Ultrasound performed of the left posterior neck palpable area of
concern. In this region, there are 3 superficial mildly prominent
benign-appearing lymph nodes with preserved architecture. One lymph
node measures up to 1.5 cm in length but 6 mm in short axis. These
are favored to be reactive.

No other regional soft tissue abnormality, mass, cyst, or fluid
collection.
IMPRESSION: Left posterior neck prominent superficial lymph nodes favored to be
reactive.
